# Patient Record
Sex: Female | Born: 1965
Health system: Southern US, Community
[De-identification: ages and names within clinical notes are randomized; demographics above are authoritative.]

## PROBLEM LIST (undated history)

## (undated) DIAGNOSIS — G2581 Restless legs syndrome: Secondary | ICD-10-CM

## (undated) DIAGNOSIS — T7840XA Allergy, unspecified, initial encounter: Secondary | ICD-10-CM

## (undated) DIAGNOSIS — M4800 Spinal stenosis, site unspecified: Secondary | ICD-10-CM

## (undated) DIAGNOSIS — K760 Fatty (change of) liver, not elsewhere classified: Secondary | ICD-10-CM

## (undated) DIAGNOSIS — R69 Illness, unspecified: Secondary | ICD-10-CM

## (undated) DIAGNOSIS — M26629 Arthralgia of temporomandibular joint, unspecified side: Secondary | ICD-10-CM

## (undated) DIAGNOSIS — F329 Major depressive disorder, single episode, unspecified: Secondary | ICD-10-CM

## (undated) DIAGNOSIS — G4700 Insomnia, unspecified: Secondary | ICD-10-CM

## (undated) DIAGNOSIS — I10 Essential (primary) hypertension: Secondary | ICD-10-CM

## (undated) DIAGNOSIS — M810 Age-related osteoporosis without current pathological fracture: Secondary | ICD-10-CM

## (undated) DIAGNOSIS — E079 Disorder of thyroid, unspecified: Secondary | ICD-10-CM

## (undated) DIAGNOSIS — Z87898 Personal history of other specified conditions: Secondary | ICD-10-CM

## (undated) DIAGNOSIS — M797 Fibromyalgia: Secondary | ICD-10-CM

## (undated) DIAGNOSIS — S73199A Other sprain of unspecified hip, initial encounter: Secondary | ICD-10-CM

## (undated) DIAGNOSIS — E559 Vitamin D deficiency, unspecified: Secondary | ICD-10-CM

## (undated) DIAGNOSIS — F419 Anxiety disorder, unspecified: Secondary | ICD-10-CM

## (undated) DIAGNOSIS — Z8781 Personal history of (healed) traumatic fracture: Secondary | ICD-10-CM

## (undated) DIAGNOSIS — F32A Depression, unspecified: Secondary | ICD-10-CM

## (undated) DIAGNOSIS — D649 Anemia, unspecified: Secondary | ICD-10-CM

## (undated) DIAGNOSIS — Z862 Personal history of diseases of the blood and blood-forming organs and certain disorders involving the immune mechanism: Secondary | ICD-10-CM

## (undated) DIAGNOSIS — Z8619 Personal history of other infectious and parasitic diseases: Secondary | ICD-10-CM

## (undated) DIAGNOSIS — G4730 Sleep apnea, unspecified: Secondary | ICD-10-CM

## (undated) DIAGNOSIS — M1990 Unspecified osteoarthritis, unspecified site: Secondary | ICD-10-CM

## (undated) DIAGNOSIS — K219 Gastro-esophageal reflux disease without esophagitis: Secondary | ICD-10-CM

## (undated) DIAGNOSIS — G43909 Migraine, unspecified, not intractable, without status migrainosus: Secondary | ICD-10-CM

## (undated) DIAGNOSIS — E039 Hypothyroidism, unspecified: Secondary | ICD-10-CM

## (undated) HISTORY — PX: OOPHORECTOMY: SHX86

## (undated) HISTORY — DX: Migraine, unspecified, not intractable, without status migrainosus: G43.909

## (undated) HISTORY — DX: Anemia, unspecified: D64.9

## (undated) HISTORY — DX: Other sprain of unspecified hip, initial encounter: S73.199A

## (undated) HISTORY — DX: Spinal stenosis, site unspecified: M48.00

## (undated) HISTORY — DX: Allergy, unspecified, initial encounter: T78.40XA

## (undated) HISTORY — DX: Essential (primary) hypertension: I10

## (undated) HISTORY — DX: Restless legs syndrome: G25.81

## (undated) HISTORY — PX: MYOMECTOMY: SHX85

## (undated) HISTORY — DX: Personal history of other infectious and parasitic diseases: Z86.19

## (undated) HISTORY — PX: ENDOMETRIAL ABLATION: SHX621

## (undated) HISTORY — DX: Personal history of (healed) traumatic fracture: Z87.81

## (undated) HISTORY — PX: BUNIONECTOMY: SHX129

## (undated) HISTORY — DX: Arthralgia of temporomandibular joint, unspecified side: M26.629

## (undated) HISTORY — DX: Personal history of other specified conditions: Z87.898

## (undated) HISTORY — DX: Unspecified osteoarthritis, unspecified site: M19.90

## (undated) HISTORY — DX: Insomnia, unspecified: G47.00

## (undated) HISTORY — DX: Fibromyalgia: M79.7

## (undated) HISTORY — DX: Vitamin D deficiency, unspecified: E55.9

## (undated) HISTORY — PX: OTHER SURGICAL HISTORY: SHX169

## (undated) HISTORY — DX: Gastro-esophageal reflux disease without esophagitis: K21.9

## (undated) HISTORY — PX: ABDOMINAL HYSTERECTOMY: SHX81

## (undated) HISTORY — PX: TONSILLECTOMY AND ADENOIDECTOMY: SUR1326

## (undated) HISTORY — DX: Age-related osteoporosis without current pathological fracture: M81.0

## (undated) HISTORY — DX: Disorder of thyroid, unspecified: E07.9

## (undated) HISTORY — DX: Anxiety disorder, unspecified: F41.9

## (undated) HISTORY — DX: Personal history of diseases of the blood and blood-forming organs and certain disorders involving the immune mechanism: Z86.2

---

## 2000-05-29 ENCOUNTER — Other Ambulatory Visit: Admission: RE | Admit: 2000-05-29 | Discharge: 2000-05-29 | Payer: Self-pay | Admitting: Internal Medicine

## 2000-05-30 ENCOUNTER — Other Ambulatory Visit: Admission: RE | Admit: 2000-05-30 | Discharge: 2000-05-30 | Payer: Self-pay | Admitting: Internal Medicine

## 2000-06-25 ENCOUNTER — Other Ambulatory Visit: Admission: RE | Admit: 2000-06-25 | Discharge: 2000-06-25 | Payer: Self-pay | Admitting: *Deleted

## 2000-06-25 ENCOUNTER — Encounter (INDEPENDENT_AMBULATORY_CARE_PROVIDER_SITE_OTHER): Payer: Self-pay | Admitting: Specialist

## 2000-11-28 ENCOUNTER — Other Ambulatory Visit: Admission: RE | Admit: 2000-11-28 | Discharge: 2000-11-28 | Payer: Self-pay | Admitting: Internal Medicine

## 2001-05-26 ENCOUNTER — Other Ambulatory Visit: Admission: RE | Admit: 2001-05-26 | Discharge: 2001-05-26 | Payer: Self-pay | Admitting: Internal Medicine

## 2001-10-17 ENCOUNTER — Encounter: Admission: RE | Admit: 2001-10-17 | Discharge: 2001-10-17 | Payer: Self-pay | Admitting: Internal Medicine

## 2001-10-17 ENCOUNTER — Encounter: Payer: Self-pay | Admitting: Internal Medicine

## 2002-04-15 ENCOUNTER — Inpatient Hospital Stay (HOSPITAL_COMMUNITY): Admission: RE | Admit: 2002-04-15 | Discharge: 2002-04-17 | Payer: Self-pay | Admitting: Obstetrics & Gynecology

## 2002-04-15 ENCOUNTER — Encounter (INDEPENDENT_AMBULATORY_CARE_PROVIDER_SITE_OTHER): Payer: Self-pay

## 2002-04-20 ENCOUNTER — Inpatient Hospital Stay (HOSPITAL_COMMUNITY): Admission: AD | Admit: 2002-04-20 | Discharge: 2002-04-20 | Payer: Self-pay | Admitting: Obstetrics

## 2002-09-27 ENCOUNTER — Emergency Department (HOSPITAL_COMMUNITY): Admission: EM | Admit: 2002-09-27 | Discharge: 2002-09-27 | Payer: Self-pay | Admitting: Emergency Medicine

## 2002-10-08 ENCOUNTER — Encounter: Payer: Self-pay | Admitting: Obstetrics & Gynecology

## 2002-10-08 ENCOUNTER — Ambulatory Visit (HOSPITAL_COMMUNITY): Admission: RE | Admit: 2002-10-08 | Discharge: 2002-10-08 | Payer: Self-pay | Admitting: Obstetrics & Gynecology

## 2002-10-11 ENCOUNTER — Emergency Department (HOSPITAL_COMMUNITY): Admission: EM | Admit: 2002-10-11 | Discharge: 2002-10-11 | Payer: Self-pay | Admitting: Emergency Medicine

## 2003-03-30 ENCOUNTER — Encounter: Admission: RE | Admit: 2003-03-30 | Discharge: 2003-06-09 | Payer: Self-pay | Admitting: Obstetrics and Gynecology

## 2004-02-07 ENCOUNTER — Encounter: Admission: RE | Admit: 2004-02-07 | Discharge: 2004-02-07 | Payer: Self-pay | Admitting: Internal Medicine

## 2004-02-18 ENCOUNTER — Encounter: Admission: RE | Admit: 2004-02-18 | Discharge: 2004-02-18 | Payer: Self-pay | Admitting: Internal Medicine

## 2004-05-18 ENCOUNTER — Encounter (INDEPENDENT_AMBULATORY_CARE_PROVIDER_SITE_OTHER): Payer: Self-pay | Admitting: Specialist

## 2004-05-18 ENCOUNTER — Ambulatory Visit (HOSPITAL_COMMUNITY): Admission: RE | Admit: 2004-05-18 | Discharge: 2004-05-18 | Payer: Self-pay | Admitting: Obstetrics and Gynecology

## 2004-07-14 ENCOUNTER — Encounter: Admission: RE | Admit: 2004-07-14 | Discharge: 2004-07-14 | Payer: Self-pay | Admitting: Orthopaedic Surgery

## 2004-10-04 ENCOUNTER — Encounter: Admission: RE | Admit: 2004-10-04 | Discharge: 2004-10-04 | Payer: Self-pay | Admitting: Rheumatology

## 2005-01-05 ENCOUNTER — Ambulatory Visit (HOSPITAL_COMMUNITY): Admission: RE | Admit: 2005-01-05 | Discharge: 2005-01-05 | Payer: Self-pay | Admitting: Neurology

## 2005-01-12 ENCOUNTER — Encounter: Admission: RE | Admit: 2005-01-12 | Discharge: 2005-01-12 | Payer: Self-pay | Admitting: Rheumatology

## 2005-01-19 ENCOUNTER — Encounter: Admission: RE | Admit: 2005-01-19 | Discharge: 2005-01-19 | Payer: Self-pay | Admitting: Internal Medicine

## 2005-01-26 ENCOUNTER — Encounter: Admission: RE | Admit: 2005-01-26 | Discharge: 2005-01-26 | Payer: Self-pay | Admitting: Neurology

## 2005-02-20 ENCOUNTER — Encounter: Admission: RE | Admit: 2005-02-20 | Discharge: 2005-05-21 | Payer: Self-pay | Admitting: Specialist

## 2005-02-22 ENCOUNTER — Encounter: Admission: RE | Admit: 2005-02-22 | Discharge: 2005-02-22 | Payer: Self-pay | Admitting: Neurology

## 2005-02-22 ENCOUNTER — Ambulatory Visit (HOSPITAL_BASED_OUTPATIENT_CLINIC_OR_DEPARTMENT_OTHER): Admission: RE | Admit: 2005-02-22 | Discharge: 2005-02-22 | Payer: Self-pay | Admitting: Neurology

## 2005-02-25 ENCOUNTER — Ambulatory Visit: Payer: Self-pay | Admitting: Internal Medicine

## 2005-06-18 ENCOUNTER — Inpatient Hospital Stay (HOSPITAL_COMMUNITY): Admission: RE | Admit: 2005-06-18 | Discharge: 2005-06-22 | Payer: Self-pay | Admitting: Neurosurgery

## 2005-06-29 ENCOUNTER — Encounter: Admission: RE | Admit: 2005-06-29 | Discharge: 2005-06-29 | Payer: Self-pay | Admitting: Neurosurgery

## 2005-07-11 ENCOUNTER — Encounter: Admission: RE | Admit: 2005-07-11 | Discharge: 2005-07-11 | Payer: Self-pay | Admitting: Neurosurgery

## 2006-03-06 ENCOUNTER — Encounter (INDEPENDENT_AMBULATORY_CARE_PROVIDER_SITE_OTHER): Payer: Self-pay | Admitting: *Deleted

## 2006-03-06 ENCOUNTER — Inpatient Hospital Stay (HOSPITAL_COMMUNITY): Admission: RE | Admit: 2006-03-06 | Discharge: 2006-03-08 | Payer: Self-pay | Admitting: Obstetrics and Gynecology

## 2007-10-16 ENCOUNTER — Encounter: Admission: RE | Admit: 2007-10-16 | Discharge: 2007-10-16 | Payer: Self-pay | Admitting: Family Medicine

## 2009-01-10 ENCOUNTER — Encounter: Admission: RE | Admit: 2009-01-10 | Discharge: 2009-01-10 | Payer: Self-pay | Admitting: Otolaryngology

## 2009-01-24 ENCOUNTER — Encounter: Admission: RE | Admit: 2009-01-24 | Discharge: 2009-01-24 | Payer: Self-pay | Admitting: Obstetrics and Gynecology

## 2009-03-02 ENCOUNTER — Encounter: Admission: RE | Admit: 2009-03-02 | Discharge: 2009-03-02 | Payer: Self-pay | Admitting: Otolaryngology

## 2009-05-19 ENCOUNTER — Encounter: Admission: RE | Admit: 2009-05-19 | Discharge: 2009-05-19 | Payer: Self-pay | Admitting: Gastroenterology

## 2010-02-06 ENCOUNTER — Encounter: Admission: RE | Admit: 2010-02-06 | Discharge: 2010-02-06 | Payer: Self-pay | Admitting: Obstetrics and Gynecology

## 2010-07-28 NOTE — Op Note (Signed)
Abigail Gomez, Abigail Gomez               ACCOUNT NO.:  [id number]   MEDICAL RECORD NO.:  [id number]          PATIENT TYPE:  AMB   LOCATION:  SDC                           FACILITY:  [facility]   PHYSICIAN:  Michelle L. Grewal, M.D.DATE OF BIRTH:  1966/03/11   DATE OF PROCEDURE:  05/18/2004  DATE OF DISCHARGE:                                 OPERATIVE REPORT   PREOPERATIVE DIAGNOSIS:  Pelvic pain.   POSTOPERATIVE DIAGNOSES:  1.  Pelvic adhesions.  2.  Endometriosis.  3.  Ovarian cyst.   PROCEDURES:  1.  Diagnostic laparoscopy.  2.  Lysis of adhesions.  3.  Fulguration of endometriosis.  4.  Right salpingo-oophorectomy.   SURGEON:  Michelle L. Vincente Poli, M.D.   ANESTHESIA:  General.   SPECIMENS:  Right tube and right ovary.   ESTIMATED BLOOD LOSS:  Minimal.   COMPLICATIONS:  None.   PROCEDURE:  The patient was taken to the operating room.  She was intubated  without difficulty.  She was prepped and draped in the usual sterile  fashion.  A Foley catheter was inserted into the bladder.  A uterine  manipulator was inserted into the uterus.   Attention was turned to the abdomen.  A small infraumbilical incision was  made.  The Veress needle was inserted with one attempt.  The  pneumoperitoneum was achieved.  We then put the 11 mm trocar through the  same incision, the laparoscope was introduced into the abdominal cavity.  The patient was placed in Trendelenburg position.  The upper abdomen was  normal.  The appendix was visualized and was noted to be normal.  The lower  abdomen revealed that there were filmy adhesions of the sigmoid to the  posterior wall of the uterus, which was then clipped using scissors.  There  was also another filmy adhesion of the sigmoid to the left fallopian tube,  which was also cut.  A secondary 5 mm trocar was inserted under direct  visualization.  At this point we noticed small areas of deposits of  endometriosis on the bladder flap, more on the left side.   The uterus was  very mobile and appeared normal.  There was no obvious fibroid noted.  The  tubes were normal.  Both ovaries were enlarged, the right much greater than  the left.  The left ovary had a simple cyst in it, which was drained and was  slightly adherent to the pelvic sidewall, which was freed up easily using  some traction.  The right ovary looked like there were numerous ovarian  cysts, and this is the ovary that previously had a biopsy which revealed  endometriosis.  We decided just to perform a right salpingo-oophorectomy  using the Gyrus instrument, identifying the IP ligament burning and cutting  it and then going along the mesosalpinx and then traveling back up to the  triple pedicle.  After the right tube and ovary were freed with excellent  hemostasis, the ovary and tubes were dropped in the cul-de-sac.  We then  converted the 5 mm trocar to a 10, placed an Endobag in, easily  placed the  specimen into the Endobag, and the specimen was removed through the  suprapubic incision.  We then replaced the 10 mm trocar and I used the  Kleppingers to burn the endometriosis that was on the bladder flap on the  left side.  Irrigation of the pelvis was performed, no bleeding was noted.  Pneumoperitoneum was released.  We removed the 10 mm trocar under direct  visualization.  There was no bleeding from that site.  The other trocar was  then removed as well.  The 10 mm trocar site was closed.  The fascia was  closed using figure-of-eight using 0 Vicryl suture.  The skin was closed  with 3-0 Vicryl interrupteds and the skin at the suprapubic site was also  closed with 3-0 Vicryl interrupteds.  Local was injected at each site.  All  instruments were removed from the vagina.  All sponge, lap and instrument  counts were correct x2.  The patient was went to the recovery room  extubated, in stable condition.      MLG/MEDQ  D:  05/18/2004  T:  05/18/2004  Job:  [id number]

## 2010-07-28 NOTE — Discharge Summary (Signed)
   NAME:  Abigail Gomez, Abigail Gomez                         ACCOUNT NO.:  [id number]   MEDICAL RECORD NO.:  [id number]                   PATIENT TYPE:  INP   LOCATION:  9325                                 FACILITY:  [facility]   PHYSICIAN:  Roseanna Rainbow, M.D.         DATE OF BIRTH:  January 30, 1966   DATE OF ADMISSION:  04/15/2002  DATE OF DISCHARGE:  04/17/2002                                 DISCHARGE SUMMARY   CHIEF COMPLAINT:  The patient is a 45 year old African-American female with  chronic pelvic pain and uterine fibroids who presents for exploratory  laparotomy with abdominal myomectomy and possible resection of endometriotic  implants.  Please see the dictated History and Physical for further details.   HOSPITAL COURSE:  The patient was admitted, underwent an exploratory  laparotomy with myomectomy and removal of paratubal cyst and biopsy of the  right ovary.  Please see the dictated operative summary for further details.  Her postoperative course was uneventful and she was discharged to home on  postoperative day #2, tolerating a regular diet.   DISCHARGE DIAGNOSES:  1. Uterine fibroids.  2. Endometriosis.  3. Paratubal cyst.   PROCEDURE:  Exploratory laparotomy, abdominal myomectomy, removal of  paratubal cysts, and biopsy of right ovary.   CONDITION:  Stable.   DIET:  Regular.   ACTIVITY:  No strenuous activity or intercourse.   MEDICATIONS:  Percocet one to two tablets p.o. q.i.d. p.r.n.   DISPOSITION:  The patient was to follow up in the office on April 20, 2002  for staple removal.                                               Roseanna Rainbow, M.D.    LAJ/MEDQ  D:  05/14/2002  T:  05/14/2002  Job:  [id number]

## 2010-07-28 NOTE — H&P (Signed)
NAME:  Abigail Gomez, Abigail Gomez NO.:  [id number]   MEDICAL RECORD NO.:  [id number]                   PATIENT TYPE:  INP   LOCATION:  NA                                   FACILITY:  [facility]   PHYSICIAN:  Roseanna Rainbow, M.D.         DATE OF BIRTH:  16-Nov-1965   DATE OF ADMISSION:  DATE OF DISCHARGE:                                HISTORY & PHYSICAL   CHIEF COMPLAINT:  The patient is a 45 year old African-American female with  chronic pelvic pain and uterine fibroids who presents for exploratory  laparotomy with abdominal myomectomy and possible resection of endometriotic  implants.   HISTORY OF PRESENT ILLNESS:  The patient describes her menstrual periods as  regular with a duration of four days.  She has had moderate dysmenorrhea  which has not been helped by medication.  She has also had episodes of  spotting preceding her menses over the past year.  She has sharp pelvic pain  without menses as well.  A review of the pelvic pain questionnaire was  consistent with a premenstrual syndrome and secondary dysmenorrhea.  Work-up  to date has included a normal Pap smear from March of 2003, an ultrasound  from August of 2003 that demonstrated a retroverted uterus that was 8.6 cm  in sagittal diameter with a dominant approximately 4 cm in diameter uterine  fibroid.  Laboratory work includes a normal TSH from November of 2003 as  well as a hemoglobin of 10.4.   PAST OB/GYN HISTORY:  As above.  Contraceptive type is abstinence.  She has  a history of human papilloma virus, Chlamydia, cervicitis.  She has been  pregnant two times.  She has no living children and there is a history of  two miscarriages or abortions.   PAST MEDICAL HISTORY:  1. Migraine headaches.  2. Psoriasis.  3. Eczema.  4. Bell's palsy.   PAST SURGICAL HISTORY:  Bunionectomy.   SOCIAL HISTORY:  She is single.  She is a Engineer, mining.  She has no significant smoking  history.  She does not give  any significant history of alcohol usage.  She reports a minimal amount of  caffeinated beverages daily and she denies illicit drug use.   FAMILY HISTORY:  Uterine cancer, hypertension, cerebrovascular accident,  adult-onset diabetes, osteoporosis, and Alzheimer's.   REVIEW OF SYSTEMS:  GASTROINTESTINAL:  No food intolerance, abdominal pain,  nausea, vomiting, bloating, water brash, diarrhea, constipation, melena, or  hematochezia.  No change in the caliber of her stools.  GENITOURINARY:  See  history of present illness.  No urinary problems were noted.  MUSCULOSKELETAL:  No muscle or joint pain, weakness, swelling, inflammation.  No restriction of motion.  No atrophy or back ache.  PSYCHIATRIC:  She notes  increased stress and has insomnia.   MEDICATIONS:  1. Zyrtec.  2. Claritin.  3. Allegra.  4. Hydroxyzine.  5. Midrin.  6. Maxalt.  7. Topamax.  8. Lodine.  9. Darvocet.  10.      Percocet.  11.      BenzaClin.   ALLERGIES:  PENICILLIN--anaphylactoid type response.   PHYSICAL EXAMINATION:  VITAL SIGNS:  Height 5 feet 8 inches, weight 165  pounds, temperature 97.4, pulse 64, blood pressure 120/80.  GENERAL:  African-American female.  Appears stated age, in no acute  distress.  LUNGS:  Clear to auscultation.  HEART:  Regular rate and rhythm.  ABDOMEN:  Soft, nontender, without masses.  Bowel sounds active.  PELVIC:  External female genitalia are normal appearing.  On speculum  examination the vagina is clean.  On bimanual examination the uterus is  nontender, mildly enlarged, and retroverted.  The adnexa no masses,  organomegaly, or local guarding.   ASSESSMENT:  1. Uterine fibroids.  2. Pelvic pain.  3. Secondary anemia.   The risks, benefits, and alternative forms of management were reviewed with  the patient.  The questions were allowed to stated satisfaction and informed  consent was obtained.   PLAN:  The planned procedure is  exploratory laparotomy with abdominal  myomectomies and possible resection of endometriotic implants.                                               Roseanna Rainbow, M.D.    Judee Clara  D:  03/18/2002  T:  03/18/2002  Job:  [id number]

## 2010-07-28 NOTE — Procedures (Signed)
NAME:  Abigail Gomez, Abigail Gomez               ACCOUNT NO.:  [id number]   MEDICAL RECORD NO.:  [id number]          PATIENT TYPE:  OUT   LOCATION:  SLEEP CENTER                 FACILITY:  [facility]   PHYSICIAN:  Clinton D. Maple Hudson, M.D. DATE OF BIRTH:  1965-08-12   DATE OF STUDY:                              NOCTURNAL POLYSOMNOGRAM   REFERRING PHYSICIAN:  Dr. Amelia Jo.   DATE OF STUDY:  February 22, 2005.   INDICATION FOR STUDY:  Insomnia with sleep apnea.   EPWORTH SLEEPINESS SCORE:  4/24.   WEIGHT:  180 Pounds.   SLEEP ARCHITECTURE:  Total sleep time 322 minutes with sleep efficiency 76%.  Stage I was 2%, stage II 88%, stages III and IV 4%, REM 6% of total sleep  time. Sleep latency 81 minutes, REM latency 322 minutes, awake after sleep  onset 19 minutes, arousal index 5.6. She took Darvocet at 9:40 p.m. She took  baclofen, Cymbalta and Seroquel at 10 p.m. and then 10 milligrams of Ambien  at 11:47 p.m. She applied a back stimulation treatment prior to 10 p.m. and  applied 2 lidocaine patches and 11 p.m. to her pelvis. She requested to read  at lights out.   RESPIRATORY DATA:  Apnea/hypopnea index (AHI, RDI) 0.6 per hour which is  negligible and within normal limits. There were 2 obstructive apneas and 1  hypopnea. She slept mainly supine. REM AHI 3.2 per hour.   OXYGEN DATA:  No snoring was noted. Oxygen desaturation was to 83%  transiently with mean oxygen saturation through the study at 98% on room  air.   CARDIAC DATA:  Normal sinus rhythm.   MOVEMENT/PARASOMNIA:  A total of 85 limb jerks were reported of which 15  were associated with arousal or awakening for periodic limb movement with  arousal index of 2.8 per hour which is of doubtful significance.   IMPRESSION/RECOMMENDATIONS:  1.  Significant amounts of sedating medication taken in the evening at study      onset.  2.  Sleep architecture significant especially for reduced sleep efficiency      and reduced REM which may  include some medication effect.  3.  No significant sleep disordered breathing or cardiac rhythm disorder.  4.  Very minimal periodic limb movement with arousal, 2.8 per hour.      Clinton D. Maple Hudson, M.D.  Diplomate, Biomedical engineer of Sleep Medicine  Electronically Signed     CDY/MEDQ  D:  02/25/2005 18:00:27  T:  02/26/2005 01:11:47  Job:  [id number]

## 2010-07-28 NOTE — H&P (Signed)
NAME:  Abigail Gomez, CHINCHILLA               ACCOUNT NO.:  [id number]   MEDICAL RECORD NO.:  [id number]          PATIENT TYPE:  AMB   LOCATION:  SDS                          FACILITY:  [facility]   PHYSICIAN:  Sanjeev K. Deveshwar, M.D.DATE OF BIRTH:  12-Apr-1965   DATE OF ADMISSION:  01/05/2005  DATE OF DISCHARGE:                                HISTORY & PHYSICAL   CHIEF COMPLAINT:  The patient presents today for cerebral angiogram.   HISTORY OF PRESENT ILLNESS:  This is a 45 year old female evaluated by Dr.  Clarisse Gouge recently for headaches.  An MRI/MRA was performed on December 28, 2004.  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  A cerebral angiogram  has been recommended.  The patient was referred to Dr. Corliss Skains.  She  presents today for that study.  She reports symptoms of ongoing headaches,  problems with her balance which she describes as cognitive dulling.   ALLERGIES:  PENICILLIN causes anaphylaxis.   CURRENT MEDICATIONS:  1.  Cymbalta 90 mg b.i.d.  2.  Ambien 10 mg at bedtime.  3.  Xanax 0.5 mg daily.  4.  Zanaflex one daily.  5.  Zyrtec 10 mg daily.  6.  Provigil 200 mg daily.  7.  Multivitamins daily.  8.  Seroquel 25 mg daily.  9.  Midrin p.r.n.  10. She also takes herbal teas, zinc, calcium with vitamin D and B vitamins      as well as Omega III.   PAST MEDICAL HISTORY:  1.  Borderline hypertension, currently being followed.  2.  History of uterine fibroids and endometriosis.  3.  History of migraine headaches.  4.  Psoriasis.  5.  Eczema.  6.  History of Bell's Palsy.   PAST SURGICAL HISTORY:  1.  The patient has had surgery for uterine fibroids as well as a right      ovary resection.  2.  History of bunion surgery.   SOCIAL HISTORY:  Patient is single.  She lives in [place], [place]  [place], alone.  She does not use alcohol or tobacco.  She works as a  Administrator, sports.   FAMILY HISTORY:  Her parents are both living.  Her mother is in  her 37s.  Her father is in his 68s.  They both have depression.   REVIEW OF SYSTEMS:  Completely negative except for the ongoing headaches.  Poor balance.  Cognitive problems.  She has occasional chest pain she feels  is related to anxiety.  She has a history of anxiety and depression.  She  has occasional gas and constipation.  She has fibromyalgia.  She reports  bruising easily.   PHYSICAL EXAMINATION:  GENERAL APPEARANCE:  A pleasant 45 year old African  American female in no acute distress.  VITAL SIGNS:  Blood pressure 118/74, pulse 72, respirations 16, temperature  97.6, oxygen saturation 98% on room air.  Her airway is rated at a 2.  Her  ASA scale is rated at a 1.  HEENT:  Unremarkable.  NECK:  No bruits, no jugular venous distension.  LUNGS:  Clear.  CARDIOVASCULAR:  Regular rate and  rhythm without murmurs.  ABDOMEN:  Soft, nontender.  EXTREMITIES:  Pulses weak but intact.  There is no significant edema.  SKIN:  Warm and dry.  NEUROLOGIC:  Mental status:  The patient is alert and oriented and follows  commands.  Cranial nerves II-XII grossly intact.  Sensation is intact to  light touch.  Motor strength is 4 to 5/5 throughout.  Cerebellar testing is  intact but performed very slowly.   IMPRESSION:  1.  History of headaches, poor balance and cognitive difficulties.  2.  Abnormal MRA performed December 28, 2004, with a question of a basilar      artery aneurysm.  3.  History of borderline hypertension, currently not treated with      medication.  4.  History of uterine fibroids and endometriosis.  5.  History of psoriasis and eczema.  6.  History of Bell's palsy.  7.  History of migraine headaches.  8.  Penicillin allergy with history of anaphylaxis.  9.  History of anxiety and depression.  10. Fibromyalgia.  11. Status post surgeries as noted above.   PLAN:  As noted, the patient will undergo cerebral angiogram today to  further evaluate for possible  aneurysms.      Delton See, P.A.    ______________________________  Grandville Silos. Corliss Skains, M.D.    DR/MEDQ  D:  01/05/2005  T:  01/05/2005  Job:  [id number]   cc:   Candyce Churn. Allyne Gee, M.D.  Fax: [phone]

## 2010-07-28 NOTE — Op Note (Signed)
NAME:  Abigail Gomez, Abigail Gomez                         ACCOUNT NO.:  [id number]   MEDICAL RECORD NO.:  [id number]                   PATIENT TYPE:  INP   LOCATION:  9322                                 FACILITY:  [facility]   PHYSICIAN:  Roseanna Rainbow, M.D.         DATE OF BIRTH:  June 02, 1965   DATE OF PROCEDURE:  04/15/2002  DATE OF DISCHARGE:                                 OPERATIVE REPORT   PREOPERATIVE DIAGNOSES:  1. Uterine fibroids with secondary anemia.  2. Pelvic pain.   POSTOPERATIVE DIAGNOSES:  1. Uterine fibroids with secondary anemia.  2. Pelvic pain.  3. Bilateral peritubal cysts.  4. Rule out right ovarian endometriotic implant.   PROCEDURE:  1. Exploratory laparotomy.  2. Abdominal myomectomy.  3. Excision of peritubal cysts.  4. Biopsy of the right ovarian cortex.   SURGEON:  Roseanna Rainbow, M.D., Bing Neighbors. Clearance Coots, M.D.   ANESTHESIA:  General endotracheal.   COMPLICATIONS:  None.   ESTIMATED BLOOD LOSS:  Less than 50 mL.   FLUIDS:  As per anesthesiology.   URINE OUTPUT:  As per anesthesiology.   FINDINGS:  There was a fundal myoma coming off of the posterior aspect of  the uterus that was approximately 4-5 cm in diameter.  Involving the  fimbriated portions of the tubes bilaterally were simple appearing cysts  approximately 1 cm in diameter.  On the ovarian cortex on the right the  antimesenteric portion of the ovary there was a small approximately 5 mm  diameter questionable powder burn lesion.  The remainder of the peritoneal  surfaces involving the anterior/posterior cul-de-sacs, ovarian fossa were  normal.  The appendix appeared normal as well.   PROCEDURE:  The risks, benefits, indications, and alternatives of the  procedure were reviewed with the patient and informed consent was obtained.  She was taken to the operating room with an IV running.  The patient was  placed in the dorsal lithotomy position, given general anesthesia, and  prepped  and draped in the usual sterile fashion.  A Pfannenstiel skin  incision was then made approximately 2 cm above the symphysis pubis and  extended to the rectus fascia.  The fascia was then incised bilaterally with  curved Mayo scissors and the muscles of the anterior abdominal wall were  separated in the midline.  The parietoperitoneum was then grasped between  two pickups, elevated, and entered sharply with Metzenbaum scissors.  The  pelvis was examined with the findings noted above.  An O'Connor-O-Sullivan  retractor was then placed into the incision and the bowel packed away with  moistened laparotomy sponges.  At this point the fibroid was grasped with  towel clamps.  The myometrium overlying the fibroid was then incised with  cautery.  This was after the serosa was infiltrated with a dilute Pitressin  solution.  The fibroid was then grasped and enucleated with sharp  dissection.  The base was clamped and suture ligated  with 2-0 Monocryl.  The  defect of the myometrium was then closed in layers using running suture of 2-  0 Monocryl.  Excellent hemostasis was noted.  The right ovarian biopsy was  then obtained with cautery.  Excellent hemostasis was noted.  The peritubal  cysts were excised utilizing cautery as well.  Again, excellent hemostasis  was noted.  The pelvis was copiously irrigated with warm normal saline.  All  laparotomy sponges and instruments were removed from the abdomen.  The  fascia was closed with 0 Vicryl.  The subcutaneous layer was then  infiltrated with Marcaine with a dilute epinephrine solution.  The skin was  closed with staples.  Sponge, lap, needle, and instrument counts were  correct x2.  The patient was taken to the PACU awake and in stable  condition.                                               Roseanna Rainbow, M.D.    Judee Clara  D:  04/15/2002  T:  04/15/2002  Job:  [id number]

## 2010-07-28 NOTE — H&P (Signed)
[name], Abigail Gomez NO.:  [id number]   MEDICAL RECORD NO.:  [id number]          PATIENT TYPE:  INP   LOCATION:  3172                         FACILITY:  [facility]   PHYSICIAN:  Hewitt Shorts, M.D.DATE OF BIRTH:  [birth year]   DATE OF ADMISSION:  06/18/2005  DATE OF DISCHARGE:                                HISTORY & PHYSICAL   HISTORY OF PRESENT ILLNESS:  The patient is a 45 year old, right-handed,  black female who has a longstanding of migraines dating back to 2002.  They  had been somewhat cyclical in nature but had been worse for the past year or  two.  She had been followed by a couple of different physicians for  management and evaluation of her headaches.  She describes a burning pain in  the posterior aspect of her head and neck.  The headaches themselves could  be frontal, temporal or vertex to one side or the other.  She has had  tinnitus for a number of years but denied any nausea or vomiting or  dysphagia.   The patient underwent work-up with MRI and MRA of the brain.  MRI was  notable for significant Chiari I malformation.  MRA showed a slightly  bulbous appearance with termination of the basilar artery.  However, there  was no evidence of cerebral aneurysm or arteriovenous malformation.   The patient was evaluated and consideration was made of decompression of the  Chiari malformation.  The patient was seen in second opinion consultation by  Dr. Donalee Citrin who concurred with the diagnosis of Chiari I malformation and  concurred with the recommendation for decompression.  The patient is now  admitted for decompression of her Chiari malformation.   PAST MEDICAL HISTORY:  1.  She has been treated for hypertension for the past five months.  2.  History of fibromyalgia since last year.  3.  History of insomnia.  4.  History of uterine fibroids.   Does not describe any history of myocardial infarction, cancer, stroke,  diabetes, peptic ulcer  disease, or lung disease.   PAST SURGICAL HISTORY:  1.  Right oophorectomy in March 2006.  2.  Myomectomy in February 2004.  3.  Bunionectomy in June 1998.   ALLERGIES:  She reports allergies of penicillin.   CURRENT MEDICATIONS:  1.  Lisinopril 10 mg daily.  2.  Cymbalta 90 mg daily.  3.  Zyrtec 10 mg daily.  4.  Ambien or Ambien CR at night.  5.  Flexeril 10 mg daily p.r.n.  6.  Klonopin or Xanax 1 mg at night.  7.  Ibuprofen 800 mg p.r.n.  8.  Darvocet p.r.n.  9.  Provigil 2 mg p.r.n.  10. Midrin one or two p.r.n.  11. Neurontin 300 mg t.i.d.  12. Vicodin p.r.n.  13. Demerol p.r.n.  14. Seroquel 150 mg at night.   FAMILY HISTORY:  Her mother is in good health at age 30.  Father has a  number of medical problems including alcoholism, hypertension, diabetes.  He  is age 47.  Family history of diabetes, hypertension, depression, cerebral  aneurysm,  and heart disease.  Closest relative with a cerebral aneurysm was  a maternal uncle.   SOCIAL HISTORY:  The patient is unmarried.  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  She does not smoke.  She drinks alcoholic  beverages occasionally.  She denies history of substance abuse.   REVIEW OF SYSTEMS:  Notable forwhat is described in the history of present  illness and past medical history.  She does have history of anemia but her  14-point system review is otherwise unremarkable.   PHYSICAL EXAMINATION:  GENERAL:  Well-developed, well-nourished black female  in no acute distress.  VITAL SIGNS:  Temperature 98.6, pulse 69, blood pressure 118/77, respiratory  rate 16.  Height 5 feet 8 inches, weight 185 pounds.  LUNGS:  Clear to auscultation.  She has symmetrical respiratory excursion.  HEART:  Regular rate and rhythm.  S1 and S2.  No murmur.  ABDOMEN:  Soft, nontender.  Bowel sounds are present.  EXTREMITIES:  Clubbing, cyanosis or edema.  MUSCULOSKELETAL:  No tenderness to palpation over thecervical or spinal   process .  She has a good range of motion in the neck without discomfortwith  range of motion of the neck.  NEUROLOGIC:  Mental status:  The patient is awake, alert, fully oriented.  Her speech is fluent.  She has good comprehension.  Cranial nerves show  pupils are equal, round and reactive to light.  Funduscopic examination  shows no evidence of papilledema, hemorrhages or exudates.  Extraocular  movements were intact.  Facial movement is symmetrical.  Hearing is present  bilaterally.  Palate movement is symmetrical.  Shoulder shrug is  symmetrical.  Tongue is midline.  Motor examination shows 5/5 strength in  the upper and lower extremities.  She has a normal gait and normal tandem  gait.  Sensation is intact to pinprick in the distal upper and lower  extremities.  Reflexes are 1-2 in thebiceps, brachialis, triceps, quadriceps  and gastrocnemius symmetrical.  Bilateral toes are downgoing.   IMPRESSION:  Chiari I malformation.  Longstanding history of headache,  burning sensation, tinnitus.  The patient is admitted for decompression of  the Chiari I malformation via suboccipital craniectomy, upper cervical  laminectomy and duraplasty.  She understands that this may well improve her  symptomatology although she may still have an underlying migraine condition  and may still have some headaches afterwards even after successful  decompression.  We discussed the nature of the condition, alternative to surgery, the nature  of the surgical procedure itself, estimated length of hospital stay, overall  recuperation, limitations postoperatively, and infection, bleeding, possible  need for  transfusion, risk of neurologic dysfunction, bleeding, paralysis, coma and  death and anesthetic, risk of myocardial infarction, stroke, pneumonia, and  death.  Understanding all this, the patient wishes to proceed with surgery  and is admitted for such.     Hewitt Shorts, M.D.  Electronically  Signed     RWN/MEDQ  D:  06/18/2005  T:  06/18/2005  Job:  [id number]

## 2010-07-28 NOTE — Op Note (Signed)
Abigail Gomez, Abigail Gomez               ACCOUNT NO.:  [id number]   MEDICAL RECORD NO.:  [id number]          PATIENT TYPE:  INP   LOCATION:  9305                          FACILITY:  [facility]   PHYSICIAN:  Michelle L. Grewal, M.D.DATE OF BIRTH:  Jul 31, 1965   DATE OF PROCEDURE:  03/06/2006  DATE OF DISCHARGE:                               OPERATIVE REPORT   PREOPERATIVE DIAGNOSIS:  1. Pelvic pain.  2. Dysfunctional uterine bleeding.   POSTOPERATIVE DIAGNOSES:  1. Pelvic pain.  2. Dysfunctional uterine bleeding.   PROCEDURE:  Total abdominal hysterectomy and left salpingo-oophorectomy.   SURGEON:  Michelle L. Vincente Poli, M.D.   ASSISTANT:  Zelphia Cairo, MD   ANESTHESIA:  General.   SPECIMENS:  Uterus, cervix, left tube and ovary.   ESTIMATED BLOOD LOSS:  100 mL.   COMPLICATIONS:  None.   PROCEDURE:  Patient taken to the operating room.  She is intubated  without difficulty.  She was then prepped and draped in the usual  sterile fashion.  A Foley catheter had been inserted and was draining  clear urine.  A low transverse incision was made and carried down to the  fascia, fascia scored in the midline and extended laterally.  The rectus  muscles were separated in the midline and the peritoneum was entered  bluntly.  The peritoneal incision was then stretched, the bladder blade  was inserted, and a self-retaining retractor was inserted.  The large  and small bowel were placed in the upper abdomen.  No adhesions or  endometriosis were noted.  She was an absent right tube and ovary.  The  uterus itself was small and appeared very normal.  The uterus was  grasped on either side with a Kelly clamp.  We then transected the round  ligament on the left, divided the broad ligament, identified and  infundibulopelvic ligament and the ureter, which was well below our  clamps, placed a curved Heaney clamp across the IP ligament on the left  and then suture ligated and a free tie of 0 Vicryl suture  was used to  secure the pedicle.  The bladder flap was developed on both sides and  then the uterine arteries were clamped on either side at the level of  the internal os using curved Heaney clamp.  The pedicle was secured  using a suture ligature of 0 Vicryl suture.  We then developed the  bladder flap.  The straight Heaney clamps were then placed on either  side.  Using straight Heaney clamps, each pedicle was secured using a  suture ligature of 0 Vicryl suture.  We walked our way down the cervix  and once we reached the level of the external os, curved Heaney clamps  were placed just beneath this and the specimen was removed.  The angle  stitches were placed using 0 Vicryl suture.  The remainder of the cuff  was closed using figure-of-eights using 0 Vicryl suture.  Hemostasis was  excellent.  Irrigation was performed.  Hemostasis was again excellent.  All instruments and laparotomy pads were removed from the abdominal  cavity.  The peritoneum  was closed using 0 Vicryl in a running stitch.  The rectus muscles were reapproximated using the same 0 Vicryl.  The  fascia was closed using 0 Vicryl in a running stitch starting at each  corner and meeting in the midline.  After irrigation of the subcutaneous  layer, the skin was closed with staples.  All sponge, lap and instrument  counts were correct x2.  The patient went to recovery room in stable  condition      Michelle L. Vincente Poli, M.D.  Electronically Signed     MLG/MEDQ  D:  03/06/2006  T:  03/06/2006  Job:  [id number]

## 2010-07-28 NOTE — Discharge Summary (Signed)
NAMEJERELINE, TICER NO.:  [id number]   MEDICAL RECORD NO.:  [id number]          PATIENT TYPE:  INP   LOCATION:  3041                         FACILITY:  [facility]   PHYSICIAN:  Hewitt Shorts, M.D.DATE OF BIRTH:  Dec 05, 1965   DATE OF ADMISSION:  06/18/2005  DATE OF DISCHARGE:  06/22/2005                                 DISCHARGE SUMMARY   ADMISSION HISTORY AND PHYSICAL EXAMINATION:  Patient is a 45 year old woman  with a long-standing history of headaches who has been found to have a  significant Chiari I malformation without syringomyelia or syringobulbia.  Decision made to proceed with decompression.   PAST MEDICAL HISTORY:  Fibromyalgia.  Patient is on a number of medications  all of which are detailed in her admission history and physical examination.   PHYSICAL EXAMINATION:  GENERAL:  Unremarkable.  NEUROLOGIC:  Intact.   HOSPITAL COURSE:  Patient was admitted.  Underwent a suboccipital  craniectomy C1 and superior C2 cervical laminectomy and duraplasty with  Duraguard.  She did well.  Initially monitored in the intensive care unit.  She had moderate incisional pain.  That has steadily eased.  Her wound is  healing nicely.  She is afebrile.  She is up and ambulating actively in the  halls.  She is eating well and at this point we are planning on discharging  to home.  We are going to discontinue her staples and Steri-Strip her wound.  She has been given instructions regarding activities and wound care  following discharge.  She is to return to my office in three weeks for  follow-up.  She is also to follow up with her headache specialist, Dr.  Santiago Glad in the next two to four weeks for adjustments to her  medication regimen.  She was given a prescription for Vicodin one to two  tablets q.4-6h. p.r.n. pain, 40 tablets, no refills.   DISCHARGE DIAGNOSES:  1.  Chiari I malformation.  2.  Headaches.      Hewitt Shorts, M.D.  Electronically Signed     RWN/MEDQ  D:  06/22/2005  T:  06/22/2005  Job:  [id number]

## 2010-07-28 NOTE — Discharge Summary (Signed)
NAMEJOLICIA, Abigail Gomez               ACCOUNT NO.:  [id number]   MEDICAL RECORD NO.:  [id number]          PATIENT TYPE:  INP   LOCATION:  9305                          FACILITY:  [facility]   PHYSICIAN:  Michelle L. Grewal, M.D.DATE OF BIRTH:  1965-08-27   DATE OF ADMISSION:  03/06/2006  DATE OF DISCHARGE:  03/08/2006                               DISCHARGE SUMMARY   ADMISSION DIAGNOSIS:  Fibroid, pelvic pain and history of endometriosis  and dysfunctional uterine bleeding.   DISCHARGE DIAGNOSIS:  Fibroid, pelvic pain and history of endometriosis  and dysfunctional uterine bleeding.   HOSPITAL COURSE:  The patient is a 45 year old G-0 who has a long  history of pelvic pain, DUB and a history of endometriosis. She had a  history of a right salpingo-oophorectomy performed for endometriosis.  She has also had a history of abdominal myomectomy.  On the day of  surgery, she undergoes a TAH and LSO.  EBL was about approximately 100  mL.  She did very well postop and her postop hemoglobin on postop day #1  was 10.5 and white blood cell count was 5.9.  By postop day #2, she was  urinating well.  She was tolerating a regular diet.  She had stable  vital signs and remained afebrile.  She was discharged home in good  condition on  postop day #2.  Her prescriptions were Vivelle-Dot 0.1 mg,  ibuprofen 600 mg every six and Xanax.  She will follow up in one week  for staples.  She was advised to call if she has any temperature greater  than 100.5, nausea or vomiting, severe abdominal pain, redness, or  drainage from the incision site.  She was advised no intercourse for 6  weeks and no driving for 2 weeks.      Michelle L. Vincente Poli, M.D.  Electronically Signed     MLG/MEDQ  D:  04/02/2006  T:  04/02/2006  Job:  [id number]

## 2010-07-28 NOTE — Op Note (Signed)
Abigail Gomez, MISHKIN NO.:  [id number]   MEDICAL RECORD NO.:  [id number]          PATIENT TYPE:  INP   LOCATION:  3112                         FACILITY:  [facility]   PHYSICIAN:  Hewitt Shorts, M.D.DATE OF BIRTH:  Feb 28, 1966   DATE OF PROCEDURE:  06/18/2005  DATE OF DISCHARGE:                                 OPERATIVE REPORT   PREOPERATIVE DIAGNOSIS:  Chiari I malformation.   POSTOPERATIVE DIAGNOSIS:  Chiari I malformation.   PROCEDURE:  Suboccipital craniectomy, C1 and superior C2 laminectomy, and  duraplasty with Dura-Guard with microdissection.   SURGEON:  Hewitt Shorts, M.D.   ASSISTANT:  Lovell Sheehan.   ANESTHESIA:  General endotracheal.   INDICATION FOR PROCEDURE:  The patient is a 45 year old woman.  She  presented with longstanding history of headaches, had undergone an extensive  workup with a number of physicians.  MRI and MRA were obtained that showed a  significant Chiari malformation, and decision was made to proceed with  decompression of the Chiari malformation.   PROCEDURE:  The patient was brought to the operating room and placed under  general endotracheal anesthesia.  The three-pin Mayfield head holder was  applied and the patient was turned to a prone position.  The occipital scalp  was shaved and the occipital scalp, neck and upper back were prepped with  betadine soap and solution and draped in sterile fashion.  The midline was  infiltrated with local anesthetic with epinephrine.  The midline incision  was made and carried down through the subcutaneous tissue with bipolar  cautery and electrocautery used to maintain hemostasis.  Dissection was  carried down to the posterior cervical fascia, which was incised in the  midline.  Bipolar cautery and electrocautery were used to maintain  hemostasis.  The incision was carried down along the occipital bone to the  foramen magnum.  The ring of C1 and the spinous process and lamina of C2  were exposed, and dissection was carefully carried out laterally to either  side.  We then proceeded with suboccipital craniectomy using the XMax drill  and Kerrison punches.  A C1 laminectomy and a superior C2 laminectomy were  performed, using the XMax drill and 2-mm Kerrison punches with footplate.  The occipital mantle membrane was thickened and it was carefully divided,  exposing the dura underlying it.  Then the microscope was draped and brought  into the field to provide additional magnification, illumination and  visualization, and the remainder of the decompression was performed using  microdissection and microsurgical technique.  The dura was opened at the C1-  2 level in the midline, and the dural opening was extended rostrally.  At  the level of the superior aspect of the foramen magnum the incision was  extended superolaterally to either side, and good relaxation of the dura was  created.  Edges of the dura were coagulated as needed to maintain  hemostasis, and then a 4 cm by 4 cm piece of Dura-Guard was washed in saline  solution, and then was cut to fit the dural defect and sutured to the edge  of  the dura with interrupted and running 6-0 Prolene suture.  A good  watertight closure was achieved, which was tested against a Valsalva.  We  then proceeded with closure.  The paracervical musculature was approximated  with interrupted undyed 0 Vicryl sutures, the cervical fascia was closed  with interrupted undyed 0 and 2-0 Vicryl suture, and the subcutaneous  subcuticular area closed with inverted 2-0 and 3-0 undyed Vicryl sutures.  The skin edges were approximated with surgical staples.  The wound was  dressed with Adaptic and sterile gauze, and Hypafix.  The procedure was  tolerated well.  The estimated blood loss was 150 cc.  Sponge counts were  correct.  Following surgery the patient was turned back to the supine  position.  The three-pin Mayfield head holder was removed.  The  patient was  reversed from the anesthetic, extubated and transferred to the recovery room  for further care.      Hewitt Shorts, M.D.  Electronically Signed     RWN/MEDQ  D:  06/18/2005  T:  06/18/2005  Job:  [id number]   cc:   Hewitt Shorts, M.D.  Fax: [phone]

## 2011-01-19 ENCOUNTER — Other Ambulatory Visit: Payer: Self-pay | Admitting: Obstetrics and Gynecology

## 2011-01-19 DIAGNOSIS — Z1231 Encounter for screening mammogram for malignant neoplasm of breast: Secondary | ICD-10-CM

## 2011-02-12 ENCOUNTER — Ambulatory Visit
Admission: RE | Admit: 2011-02-12 | Discharge: 2011-02-12 | Disposition: A | Discharge status: Home / Self Care | Payer: BC Managed Care – PPO | Source: Ambulatory Visit | Attending: Obstetrics and Gynecology | Admitting: Obstetrics and Gynecology

## 2011-02-12 DIAGNOSIS — Z1231 Encounter for screening mammogram for malignant neoplasm of breast: Secondary | ICD-10-CM

## 2012-01-18 ENCOUNTER — Other Ambulatory Visit: Payer: Self-pay | Admitting: Obstetrics and Gynecology

## 2012-01-18 DIAGNOSIS — Z1231 Encounter for screening mammogram for malignant neoplasm of breast: Secondary | ICD-10-CM

## 2012-02-18 ENCOUNTER — Ambulatory Visit
Admission: RE | Admit: 2012-02-18 | Discharge: 2012-02-18 | Disposition: A | Discharge status: Home / Self Care | Payer: BC Managed Care – PPO | Source: Ambulatory Visit | Attending: Obstetrics and Gynecology | Admitting: Obstetrics and Gynecology

## 2012-02-18 DIAGNOSIS — Z1231 Encounter for screening mammogram for malignant neoplasm of breast: Secondary | ICD-10-CM

## 2012-02-29 ENCOUNTER — Other Ambulatory Visit: Payer: Self-pay | Admitting: Obstetrics and Gynecology

## 2013-03-02 ENCOUNTER — Other Ambulatory Visit: Payer: Self-pay | Admitting: Obstetrics and Gynecology

## 2013-06-11 ENCOUNTER — Telehealth: Payer: Self-pay | Admitting: Internal Medicine

## 2013-06-11 NOTE — Telephone Encounter (Signed)
Per Dr. Artist PaisYoo, he will accept as a new patient.

## 2013-09-03 LAB — CBC AND DIFFERENTIAL
HEMATOCRIT: 39 % (ref 36–46)
Hemoglobin: 12.7 g/dL (ref 12.0–16.0)
Platelets: 147 10*3/uL — AB (ref 150–399)
WBC: 7.4 10^3/mL

## 2014-03-03 ENCOUNTER — Other Ambulatory Visit: Payer: Self-pay | Admitting: Obstetrics and Gynecology

## 2014-03-09 LAB — CYTOLOGY - PAP

## 2014-08-23 LAB — HEPATIC FUNCTION PANEL
ALK PHOS: 62 U/L (ref 25–125)
ALT: 17 U/L (ref 7–35)
AST: 25 U/L (ref 13–35)
Bilirubin, Total: 0.4 mg/dL

## 2014-08-23 LAB — BASIC METABOLIC PANEL
BUN: 12 mg/dL (ref 4–21)
Creatinine: 0.9 mg/dL (ref <=1.1)
GLUCOSE: 90 mg/dL
Potassium: 4.6 mmol/L (ref 3.4–5.3)
SODIUM: 138 mmol/L (ref 137–147)

## 2014-08-23 LAB — LIPID PANEL
Cholesterol: 186 mg/dL (ref 0–200)
HDL: 67 mg/dL (ref 35–70)
LDL CALC: 100 mg/dL
Triglycerides: 101 mg/dL (ref 40–160)

## 2014-08-23 LAB — HEMOGLOBIN A1C: Hemoglobin A1C: 5.1

## 2014-12-08 ENCOUNTER — Other Ambulatory Visit: Payer: Self-pay | Admitting: Specialist

## 2014-12-08 DIAGNOSIS — I729 Aneurysm of unspecified site: Secondary | ICD-10-CM

## 2014-12-27 ENCOUNTER — Other Ambulatory Visit: Payer: Self-pay

## 2014-12-27 ENCOUNTER — Inpatient Hospital Stay: Admission: RE | Admit: 2014-12-27 | Payer: Self-pay | Source: Ambulatory Visit

## 2015-02-09 ENCOUNTER — Other Ambulatory Visit: Payer: Self-pay

## 2015-06-20 ENCOUNTER — Institutional Professional Consult (permissible substitution): Payer: Self-pay | Admitting: Internal Medicine

## 2015-06-23 DIAGNOSIS — I10 Essential (primary) hypertension: Secondary | ICD-10-CM | POA: Diagnosis not present

## 2015-06-23 DIAGNOSIS — G43009 Migraine without aura, not intractable, without status migrainosus: Secondary | ICD-10-CM | POA: Diagnosis not present

## 2015-07-07 DIAGNOSIS — M25572 Pain in left ankle and joints of left foot: Secondary | ICD-10-CM | POA: Diagnosis not present

## 2015-07-07 DIAGNOSIS — M2550 Pain in unspecified joint: Secondary | ICD-10-CM | POA: Diagnosis not present

## 2015-07-07 DIAGNOSIS — Z79899 Other long term (current) drug therapy: Secondary | ICD-10-CM | POA: Diagnosis not present

## 2015-07-07 DIAGNOSIS — R5381 Other malaise: Secondary | ICD-10-CM | POA: Diagnosis not present

## 2015-07-07 DIAGNOSIS — G4709 Other insomnia: Secondary | ICD-10-CM | POA: Diagnosis not present

## 2015-07-07 DIAGNOSIS — R531 Weakness: Secondary | ICD-10-CM | POA: Diagnosis not present

## 2015-07-07 DIAGNOSIS — M797 Fibromyalgia: Secondary | ICD-10-CM | POA: Diagnosis not present

## 2015-07-08 DIAGNOSIS — R5383 Other fatigue: Secondary | ICD-10-CM | POA: Diagnosis not present

## 2015-07-08 DIAGNOSIS — N959 Unspecified menopausal and perimenopausal disorder: Secondary | ICD-10-CM | POA: Diagnosis not present

## 2015-07-08 DIAGNOSIS — G4700 Insomnia, unspecified: Secondary | ICD-10-CM | POA: Diagnosis not present

## 2015-07-08 DIAGNOSIS — F329 Major depressive disorder, single episode, unspecified: Secondary | ICD-10-CM | POA: Diagnosis not present

## 2015-07-11 DIAGNOSIS — M797 Fibromyalgia: Secondary | ICD-10-CM | POA: Diagnosis not present

## 2015-07-11 DIAGNOSIS — M545 Low back pain: Secondary | ICD-10-CM | POA: Diagnosis not present

## 2015-07-11 DIAGNOSIS — G4709 Other insomnia: Secondary | ICD-10-CM | POA: Diagnosis not present

## 2015-07-11 DIAGNOSIS — G5600 Carpal tunnel syndrome, unspecified upper limb: Secondary | ICD-10-CM | POA: Diagnosis not present

## 2015-07-18 DIAGNOSIS — M545 Low back pain: Secondary | ICD-10-CM | POA: Diagnosis not present

## 2015-07-18 DIAGNOSIS — G5600 Carpal tunnel syndrome, unspecified upper limb: Secondary | ICD-10-CM | POA: Diagnosis not present

## 2015-07-18 DIAGNOSIS — R5383 Other fatigue: Secondary | ICD-10-CM | POA: Diagnosis not present

## 2015-07-18 DIAGNOSIS — M797 Fibromyalgia: Secondary | ICD-10-CM | POA: Diagnosis not present

## 2015-07-20 DIAGNOSIS — Z79899 Other long term (current) drug therapy: Secondary | ICD-10-CM | POA: Diagnosis not present

## 2015-07-20 LAB — BASIC METABOLIC PANEL
BUN: 15 mg/dL (ref 4–21)
CREATININE: 1 mg/dL (ref 0.5–1.1)
GLUCOSE: 89 mg/dL
Potassium: 5 mmol/L (ref 3.4–5.3)
Sodium: 139 mmol/L (ref 137–147)

## 2015-07-21 DIAGNOSIS — M545 Low back pain: Secondary | ICD-10-CM | POA: Diagnosis not present

## 2015-07-21 DIAGNOSIS — M5441 Lumbago with sciatica, right side: Secondary | ICD-10-CM | POA: Diagnosis not present

## 2015-07-21 DIAGNOSIS — G5600 Carpal tunnel syndrome, unspecified upper limb: Secondary | ICD-10-CM | POA: Diagnosis not present

## 2015-07-21 DIAGNOSIS — M62830 Muscle spasm of back: Secondary | ICD-10-CM | POA: Diagnosis not present

## 2015-07-21 DIAGNOSIS — M5431 Sciatica, right side: Secondary | ICD-10-CM | POA: Diagnosis not present

## 2015-07-21 DIAGNOSIS — M797 Fibromyalgia: Secondary | ICD-10-CM | POA: Diagnosis not present

## 2015-07-21 DIAGNOSIS — M9904 Segmental and somatic dysfunction of sacral region: Secondary | ICD-10-CM | POA: Diagnosis not present

## 2015-07-21 DIAGNOSIS — G4709 Other insomnia: Secondary | ICD-10-CM | POA: Diagnosis not present

## 2015-07-25 ENCOUNTER — Encounter: Payer: Self-pay | Admitting: General Practice

## 2015-07-28 ENCOUNTER — Encounter: Payer: Self-pay | Admitting: General Practice

## 2015-07-29 ENCOUNTER — Ambulatory Visit: Payer: BLUE CROSS/BLUE SHIELD | Admitting: Family Medicine

## 2015-08-03 DIAGNOSIS — M7071 Other bursitis of hip, right hip: Secondary | ICD-10-CM | POA: Diagnosis not present

## 2015-08-03 DIAGNOSIS — M797 Fibromyalgia: Secondary | ICD-10-CM | POA: Diagnosis not present

## 2015-08-03 DIAGNOSIS — R5381 Other malaise: Secondary | ICD-10-CM | POA: Diagnosis not present

## 2015-08-03 DIAGNOSIS — G4709 Other insomnia: Secondary | ICD-10-CM | POA: Diagnosis not present

## 2015-08-04 DIAGNOSIS — M545 Low back pain: Secondary | ICD-10-CM | POA: Diagnosis not present

## 2015-08-04 DIAGNOSIS — M797 Fibromyalgia: Secondary | ICD-10-CM | POA: Diagnosis not present

## 2015-08-04 DIAGNOSIS — G5600 Carpal tunnel syndrome, unspecified upper limb: Secondary | ICD-10-CM | POA: Diagnosis not present

## 2015-08-05 DIAGNOSIS — M797 Fibromyalgia: Secondary | ICD-10-CM | POA: Diagnosis not present

## 2015-08-05 DIAGNOSIS — M545 Low back pain: Secondary | ICD-10-CM | POA: Diagnosis not present

## 2015-08-11 DIAGNOSIS — R5383 Other fatigue: Secondary | ICD-10-CM | POA: Diagnosis not present

## 2015-08-11 DIAGNOSIS — M797 Fibromyalgia: Secondary | ICD-10-CM | POA: Diagnosis not present

## 2015-08-11 DIAGNOSIS — M545 Low back pain: Secondary | ICD-10-CM | POA: Diagnosis not present

## 2015-08-12 DIAGNOSIS — G4709 Other insomnia: Secondary | ICD-10-CM | POA: Diagnosis not present

## 2015-08-12 DIAGNOSIS — G5600 Carpal tunnel syndrome, unspecified upper limb: Secondary | ICD-10-CM | POA: Diagnosis not present

## 2015-08-12 DIAGNOSIS — M545 Low back pain: Secondary | ICD-10-CM | POA: Diagnosis not present

## 2015-08-12 DIAGNOSIS — M797 Fibromyalgia: Secondary | ICD-10-CM | POA: Diagnosis not present

## 2015-08-16 DIAGNOSIS — M62830 Muscle spasm of back: Secondary | ICD-10-CM | POA: Diagnosis not present

## 2015-08-16 DIAGNOSIS — M5441 Lumbago with sciatica, right side: Secondary | ICD-10-CM | POA: Diagnosis not present

## 2015-08-16 DIAGNOSIS — M5431 Sciatica, right side: Secondary | ICD-10-CM | POA: Diagnosis not present

## 2015-08-16 DIAGNOSIS — M9904 Segmental and somatic dysfunction of sacral region: Secondary | ICD-10-CM | POA: Diagnosis not present

## 2015-08-17 DIAGNOSIS — F411 Generalized anxiety disorder: Secondary | ICD-10-CM | POA: Diagnosis not present

## 2015-08-18 DIAGNOSIS — M25572 Pain in left ankle and joints of left foot: Secondary | ICD-10-CM | POA: Diagnosis not present

## 2015-08-18 DIAGNOSIS — G5600 Carpal tunnel syndrome, unspecified upper limb: Secondary | ICD-10-CM | POA: Diagnosis not present

## 2015-08-18 DIAGNOSIS — M545 Low back pain: Secondary | ICD-10-CM | POA: Diagnosis not present

## 2015-08-19 ENCOUNTER — Ambulatory Visit (INDEPENDENT_AMBULATORY_CARE_PROVIDER_SITE_OTHER): Payer: BLUE CROSS/BLUE SHIELD | Admitting: Pulmonary Disease

## 2015-08-19 ENCOUNTER — Encounter: Payer: Self-pay | Admitting: Pulmonary Disease

## 2015-08-19 ENCOUNTER — Other Ambulatory Visit: Payer: Self-pay | Admitting: Physician Assistant

## 2015-08-19 VITALS — BP 128/68 | HR 48 | Ht 68.0 in | Wt 175.0 lb

## 2015-08-19 DIAGNOSIS — F411 Generalized anxiety disorder: Secondary | ICD-10-CM | POA: Diagnosis not present

## 2015-08-19 DIAGNOSIS — G4733 Obstructive sleep apnea (adult) (pediatric): Secondary | ICD-10-CM

## 2015-08-19 DIAGNOSIS — M25551 Pain in right hip: Secondary | ICD-10-CM

## 2015-08-19 DIAGNOSIS — G4700 Insomnia, unspecified: Secondary | ICD-10-CM

## 2015-08-19 DIAGNOSIS — F32A Depression, unspecified: Secondary | ICD-10-CM

## 2015-08-19 DIAGNOSIS — F329 Major depressive disorder, single episode, unspecified: Secondary | ICD-10-CM

## 2015-08-19 DIAGNOSIS — G4710 Hypersomnia, unspecified: Secondary | ICD-10-CM | POA: Diagnosis not present

## 2015-08-19 DIAGNOSIS — G5600 Carpal tunnel syndrome, unspecified upper limb: Secondary | ICD-10-CM | POA: Diagnosis not present

## 2015-08-19 NOTE — Progress Notes (Signed)
Subjective:    Patient ID: Abigail Gomez, female    DOB: 27-Dec-1965, 50 y.o.   MRN: [id number]  HPI   This is the case of Abigail Gomez, 50 y.o. Female, who was referred by Philipp Ovens  in consultation regarding sleep issues and insomnia.   As you very well know, patient is a non smoker, not been dxed with asthma or copd.  Pt is here for chronic sleep issues/insomnia.   No issues falling asleep until 1987. Pt moved from [place] to [place] in 1987 for college. She denies stress or trauma at that time. She noticed that her body clock just switched and she became "active and awake" at night and "very sleepy and tired" during daytime.  She let this persist and become her norm.  She ended up taking afternoon and evening classes and would stay up until early am to do her homework and would sleep at around 3-4am and wake up at noon-ish.   After college, it was a struggle for her to be awake during the morning but she had no choice since she had to work. She ended up seeing a psychiatrist and neurologist in the years to come to help her with her insomnia/depression/anxiety.  She also did CBT and biofeedback to help her with her insomnia.  She has tried almost all medicines to help her sleep.  For the most part these medicines initially made her go to sleep but she would get reistant to these meds.   From 2006 until 2016, what worked best to let her fall asleep are ambien CR 12.5 mg for 2-3 nights alternating with temazepam 30 mg for 2-3 nights.  She would then have 2-3 nights w/o meds as a "wash out" period.  These meds worked to make her fall asleep at night but she decided to stop in 2016 since she did not want to be dependent on meds.    Since 2016, she has tried melatonin 10 mg, GAB 750, Taurine 800 mg. These are organic meds and are not as effective.   Pt has also tried acupuncture to no avail.  Pt has tried the following meds through the yrs : lunesta, sonata, seroquel, remeron,  trazodone (made her hung over), requip, mirapex, rozerem, belsomra.  These meds did NOT help her fall asleep.   She has good sleep hygiene presently.  Wakes up at 7am daily.  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 Works and is on the road until [employer].  Goes home and goes to bed at 11pm or 1130pm.  Sleeping time varies, can be anywhere from 1-2 hrs.  ALWAYS wakes up at 3am, 4am, 5 am like clockwork.  She says she has recently fallen asleep driving and she di not want to elaborate.  She says she has NOT slept in 3days.   Pt uses lavender oils and drinks camomille tea.  She keeps her room dark and conducive to sleeping.   Pt has had 3 sleep studies and they have all been (-). Last study was in early 2000.   Some snoring. Has hypersomnia. (-) gasping, choking. Has hypersomnia and it affects her fxnality. (-) witnessed apneas. ESS 10.  (-) abnormal behavior in sleep. (-) cataplexy. occasionla sleep paralysis. (-) hypnogogic or hypnopompic hallucinations.   Review of Systems  Constitutional: Negative.  Negative for fever and unexpected weight change.  HENT: Positive for congestion, postnasal drip and rhinorrhea. Negative for dental problem, ear pain, nosebleeds, sinus  pressure, sneezing, sore throat and trouble swallowing.   Eyes: Negative.  Negative for redness and itching.  Respiratory: Negative.  Negative for cough, chest tightness, shortness of breath and wheezing.   Cardiovascular: Positive for palpitations. Negative for leg swelling.  Gastrointestinal: Negative.  Negative for nausea and vomiting.  Endocrine: Negative.   Genitourinary: Negative.  Negative for dysuria.  Musculoskeletal: Positive for myalgias, joint swelling and arthralgias.  Skin: Negative.  Negative for rash.  Allergic/Immunologic: Negative.   Neurological: Positive for dizziness, light-headedness and headaches.  Hematological: Bruises/bleeds easily.  Psychiatric/Behavioral: Negative.  Negative for  dysphoric mood. The patient is not nervous/anxious.    Past Medical History  Diagnosis Date  . Anxiety   . Fibromyalgia   . RLS (restless legs syndrome)   Hypothyroid. Has Sjogrens syndrome. Was dxed with Lyme dse.  With Arnold Chiari malformation. Chronic Headaches/Migranie.  (-) CA, DVT.  Bells Palsy in 1996.   No family history on file.  Mother has cholesterol issues. Father has DM, HTN.   No past surgical history on file.   1998 - S/P Bunion removal 2004 myomectomy (partial) 2006 hysterectomy 2007 complete myomectomy 2008 complete hysterectomy 2012 tonsilectomy  Social History   Social History  . Marital Status: Single    Spouse Name: N/A  . Number of Children: N/A  . Years of Education: N/A   Occupational History  . Not on file.   Social History Main Topics  . Smoking status: Never Smoker   . Smokeless tobacco: Not on file  . Alcohol Use: Not on file  . Drug Use: Not on file  . Sexual Activity: Not on file   Other Topics Concern  . Not on file   Social History Narrative   Single, no children, works as a Quarry manager. Denies etoh and smoking.   Allergies  Allergen Reactions  . Penicillins Other (See Comments)    Syncope  . Lisinopril Cough     Outpatient Prescriptions Prior to Visit  Medication Sig Dispense Refill  . carisoprodol (SOMA) 350 MG tablet Take 350 mg by mouth daily.    . celecoxib (CELEBREX) 200 MG capsule Take 200 mg by mouth 2 (two) times daily.    . cholecalciferol (VITAMIN D) 1000 units tablet Take 1,000 Units by mouth daily.    Marland Kitchen estradiol (CLIMARA - DOSED IN MG/24 HR) 0.075 mg/24hr patch Place 0.075 mg onto the skin once a week.    . lidocaine (LIDODERM) 5 % Place 1 patch onto the skin daily. Remove & Discard patch within 12 hours or as directed by MD    . Magnesium 200 MG TABS Take 1 tablet by mouth daily.    . Vortioxetine HBr (TRINTELLIX) 20 MG TABS Take by mouth.    . zonisamide (ZONEGRAN) 100 MG capsule Take 100 mg by mouth  daily.    . Ginger, Zingiber officinalis, 500 MG CAPS Take by mouth.    . levothyroxine (SYNTHROID, LEVOTHROID) 25 MCG tablet Take 25 mcg by mouth daily before breakfast.    . zolpidem (AMBIEN CR) 12.5 MG CR tablet Take 12.5 mg by mouth at bedtime as needed for sleep. Reported on 08/19/2015    . Armodafinil (NUVIGIL) 150 MG tablet Take 150 mg by mouth daily.    . temazepam (RESTORIL) 30 MG capsule Take 30 mg by mouth at bedtime as needed for sleep.     No facility-administered medications prior to visit.   Meds ordered this encounter  Medications  . doxycycline (VIBRA-TABS) 100 MG tablet  Sig: Take 2 tablets by mouth 2 (two) times daily.    Refill:  0  . progesterone (PROMETRIUM) 100 MG capsule    Sig: Take 200 mg by mouth at bedtime.    Refill:  3  . propranolol ER (INDERAL LA) 80 MG 24 hr capsule    Sig: Take 80 mg by mouth daily.    Refill:  5  . thyroid (NATURE-THROID) 32.5 MG tablet    Sig: Take 32.5 mg by mouth daily.           Objective:   Physical Exam  Vitals:  Filed Vitals:   08/19/15 1139  BP: 128/68  Pulse: 48  Height: 5\' 8"  (1.727 m)  Weight: 175 lb (79.379 kg)  SpO2: 98%    Constitutional/General:  Pleasant, well-nourished, well-developed, not in any distress,  Comfortably seating.  Well kempt. VERY AWAKE AND ALERT. No note of falling asleep seen despite not sleeping for 3 days per her account.   Body mass index is 26.61 kg/(m^2). Wt Readings from Last 3 Encounters:  08/19/15 175 lb (79.379 kg)    HEENT: Pupils equal and reactive to light and accommodation. Anicteric sclerae. Normal nasal mucosa.   No oral  lesions,  mouth clear,  oropharynx clear, no postnasal drip. (-) Oral thrush. No dental caries.  Airway - Mallampati class III  Neck: No masses. Midline trachea. No JVD, (-) LAD. (-) bruits appreciated.  Respiratory/Chest: Grossly normal chest. (-) deformity. (-) Accessory muscle use.  Symmetric expansion. (-) Tenderness on palpation.    Resonant on percussion.  Diminished BS on both lower lung zones. (-) wheezing, crackles, rhonchi (-) egophony  Cardiovascular: Regular rate and  rhythm, heart sounds normal, no murmur or gallops, no peripheral edema  Gastrointestinal:  Normal bowel sounds. Soft, non-tender. No hepatosplenomegaly.  (-) masses.   Musculoskeletal:  Normal muscle tone. Normal gait.   Extremities: Grossly normal. (-) clubbing, cyanosis.  (-) edema  Skin: (-) rash,lesions seen.   Neurological/Psychiatric : alert, oriented to time, place, person. Normal mood and affect            Assessment & Plan:  Insomnia Pt with chronic insomnia which I think is 2/2 mental health issues with depression and anxiety. See above HPI.  She sees a mental health person frequently. Currently on trintellix (SSRI) for depression. For insomnia, she prefers organic meds with melatonin, GABA, taurine. What worked in the past were Hewlett-[employer]ardambien cr 12.5 mg for 2-3 nights alternating with temazepam 30 mg for 2-3 nights.  She has good sleep hygiene. We need to R/O OSA and try cpap and see if it will help her get good quality sleep.  If she needs meds for sleeping later on, I want to try trazodone 25 mg at HS. The 50 mg made her hung over I think.  May also benefit fro CBT (again).   Hypersomnia Has hypersomnia, snoring, fatigue, crowded airway. Hypersomnia affects fxnality. ESS 10. Needs an inlab study.   Depression Cont SSRI. Sees psychiatrist frequently.  Needs a sedating AD. May also benefit from an anxiolytic.  She wakes up at 3am, 4am, 5am every morning like clockwork.     Thank you very much for letting me participate in this patient's care. Please do not hesitate to give me a call if you have any questions or concerns regarding the treatment plan.   Patient will follow up with me in 2-3 months    J. Alexis FrockAngelo A. de Dios, MD 08/20/2015   1:45 AM Pulmonary  and Critical Care Medicine [facility] : [facility] Pager:  [phone] Office: [phone], Fax: [phone]

## 2015-08-19 NOTE — Patient Instructions (Signed)
   It was a pleasure taking care of you today!  We will schedule you to have a sleep study to determine if you have sleep apnea.    We will get a lab sleep study.  You will be scheduled to have a lab sleep study in 4-6 weeks.  Someone from the sleep lab will call you in 2-3 days to schedule the study with you.  They usually have cancellations every night so most likely, they will have openings for a lab sleep study next week or so.  We encourage you to do your sleep study then if possible. Please give us a call in a week is no one from the sleep lab calls you in 2-3 days.   If the sleep study is positive, we will order you a CPAP  machine.  Please call the office if you do NOT receive your machine in the next 1-2 weeks.   Please make sure you use your CPAP device everytime you sleep.  We will monitor the usage of your machine per your insurance requirement.  Your insurance company may take the machine from you if you are not using it regularly.   Please clean the mask, tubings, filter, water reservoir with soapy water every week.  Please use distilled water for the water reservoir.   Please call the office or your machine provider (DME company) if you are having issues with the device.   Return to clinic in 2-3 mos with Dr. Christene Slatese Dios

## 2015-08-20 DIAGNOSIS — F329 Major depressive disorder, single episode, unspecified: Secondary | ICD-10-CM | POA: Insufficient documentation

## 2015-08-20 DIAGNOSIS — G4710 Hypersomnia, unspecified: Secondary | ICD-10-CM | POA: Insufficient documentation

## 2015-08-20 DIAGNOSIS — G4700 Insomnia, unspecified: Secondary | ICD-10-CM | POA: Insufficient documentation

## 2015-08-20 DIAGNOSIS — F32A Depression, unspecified: Secondary | ICD-10-CM | POA: Insufficient documentation

## 2015-08-20 NOTE — Assessment & Plan Note (Addendum)
Cont SSRI. Sees psychiatrist frequently.  Needs a sedating AD. May also benefit from an anxiolytic.  She wakes up at 3am, 4am, 5am every morning like clockwork.

## 2015-08-20 NOTE — Assessment & Plan Note (Addendum)
Pt with chronic insomnia which I think is 2/2 mental health issues with depression and anxiety. See above HPI.  She sees a mental health person frequently. Currently on trintellix (SSRI) for depression. For insomnia, she prefers organic meds with melatonin, GABA, taurine. What worked in the past were Hewlett-Packardambien cr 12.5 mg for 2-3 nights alternating with temazepam 30 mg for 2-3 nights.  She has good sleep hygiene. We need to R/O OSA and try cpap and see if it will help her get good quality sleep.  If she needs meds for sleeping later on, I want to try trazodone 25 mg at HS. The 50 mg made her hung over I think.  May also benefit fro CBT (again).

## 2015-08-20 NOTE — Assessment & Plan Note (Signed)
Has hypersomnia, snoring, fatigue, crowded airway. Hypersomnia affects fxnality. ESS 10. Needs an inlab study.

## 2015-08-22 DIAGNOSIS — G5600 Carpal tunnel syndrome, unspecified upper limb: Secondary | ICD-10-CM | POA: Diagnosis not present

## 2015-08-23 DIAGNOSIS — M545 Low back pain: Secondary | ICD-10-CM | POA: Diagnosis not present

## 2015-08-23 DIAGNOSIS — G5600 Carpal tunnel syndrome, unspecified upper limb: Secondary | ICD-10-CM | POA: Diagnosis not present

## 2015-08-24 DIAGNOSIS — G5600 Carpal tunnel syndrome, unspecified upper limb: Secondary | ICD-10-CM | POA: Diagnosis not present

## 2015-08-25 DIAGNOSIS — M545 Low back pain: Secondary | ICD-10-CM | POA: Diagnosis not present

## 2015-08-25 DIAGNOSIS — G5600 Carpal tunnel syndrome, unspecified upper limb: Secondary | ICD-10-CM | POA: Diagnosis not present

## 2015-08-30 DIAGNOSIS — G5600 Carpal tunnel syndrome, unspecified upper limb: Secondary | ICD-10-CM | POA: Diagnosis not present

## 2015-08-30 DIAGNOSIS — M545 Low back pain: Secondary | ICD-10-CM | POA: Diagnosis not present

## 2015-09-05 ENCOUNTER — Other Ambulatory Visit: Payer: Self-pay | Admitting: Pulmonary Disease

## 2015-09-05 DIAGNOSIS — G4710 Hypersomnia, unspecified: Secondary | ICD-10-CM

## 2015-09-06 DIAGNOSIS — G5600 Carpal tunnel syndrome, unspecified upper limb: Secondary | ICD-10-CM | POA: Diagnosis not present

## 2015-09-06 DIAGNOSIS — M545 Low back pain: Secondary | ICD-10-CM | POA: Diagnosis not present

## 2015-09-07 DIAGNOSIS — G5600 Carpal tunnel syndrome, unspecified upper limb: Secondary | ICD-10-CM | POA: Diagnosis not present

## 2015-09-08 DIAGNOSIS — M545 Low back pain: Secondary | ICD-10-CM | POA: Diagnosis not present

## 2015-09-08 DIAGNOSIS — G5600 Carpal tunnel syndrome, unspecified upper limb: Secondary | ICD-10-CM | POA: Diagnosis not present

## 2015-09-19 ENCOUNTER — Ambulatory Visit (INDEPENDENT_AMBULATORY_CARE_PROVIDER_SITE_OTHER): Payer: BLUE CROSS/BLUE SHIELD | Admitting: Family Medicine

## 2015-09-19 ENCOUNTER — Encounter: Payer: Self-pay | Admitting: Family Medicine

## 2015-09-19 VITALS — BP 121/79 | HR 79 | Temp 98.8°F | Resp 16 | Ht 68.0 in | Wt 179.1 lb

## 2015-09-19 DIAGNOSIS — G4700 Insomnia, unspecified: Secondary | ICD-10-CM

## 2015-09-19 DIAGNOSIS — M797 Fibromyalgia: Secondary | ICD-10-CM | POA: Diagnosis not present

## 2015-09-19 DIAGNOSIS — E039 Hypothyroidism, unspecified: Secondary | ICD-10-CM

## 2015-09-19 DIAGNOSIS — F32A Depression, unspecified: Secondary | ICD-10-CM

## 2015-09-19 DIAGNOSIS — F419 Anxiety disorder, unspecified: Principal | ICD-10-CM

## 2015-09-19 DIAGNOSIS — G5600 Carpal tunnel syndrome, unspecified upper limb: Secondary | ICD-10-CM | POA: Diagnosis not present

## 2015-09-19 DIAGNOSIS — M545 Low back pain: Secondary | ICD-10-CM | POA: Diagnosis not present

## 2015-09-19 DIAGNOSIS — G43909 Migraine, unspecified, not intractable, without status migrainosus: Secondary | ICD-10-CM

## 2015-09-19 DIAGNOSIS — F418 Other specified anxiety disorders: Secondary | ICD-10-CM

## 2015-09-19 DIAGNOSIS — F329 Major depressive disorder, single episode, unspecified: Secondary | ICD-10-CM | POA: Insufficient documentation

## 2015-09-19 NOTE — Patient Instructions (Signed)
We are going to get all of your information and see what we need to do Call with any questions or concerns Hang in there!  We'll try and figure this out!

## 2015-09-19 NOTE — Progress Notes (Signed)
   Subjective:    Patient ID: Abigail MomentNicole N Gomez, female    DOB: May 05, 1965, 50 y.o.   MRN: [id number]  HPI New to establish.  Previous MD- Dew[facility] Penning[facility]my Spears then [facility] ('not a pleasant experience')  Fibromyalgia- chronic problem, seeing Dr Corliss Skainseveshwar.  Currently on Soma.  In acupuncture and PT.  + fatigue.  Pt had dizziness and weight gain on Lyrica.  Previously on Savella.    Insomnia- chronic problem, has had several sleep studies.  Now seeing Dr Eyvonne Mechanici Dios for another sleep study.  Pt reports there are 3-4 days at a time that she won't sleep.  Pt has RLS.  No relief w/ Trazodone, 'it gives me a really bad hangover'.  Minimal improvement w/ Ambien.  Migraines- chronic problem, started in HS.  Following w/ Dr Neale BurlyFreeman.  On Maxalt prn and Zonegran and propranolol ER daily.  Anxiety/depression- chronic problem, seeing Deatra RobinsonKaren Jones.  On Trintellix.  Pt reports provider is aware of her 3-4 day episodes w/o sleep.  Not on mood stabilizer.    [facility]roid- [facility] and is now on [place] Thyroid 32.5mg  daily.   Urinary incontinence- pt reports urgency w/o lead time to get to restroom.  Saw [facility] ago and had did pelvic floor exercises.    Overweight- ongoing issue for pt.  She reports weight will fluctuate.  Pt reports she just had blood work recently.  Review of Systems For ROS see HPI     Objective:   Physical Exam  Constitutional: She is oriented to person, place, and time. She appears well-developed and well-nourished. No distress.  HENT:  Head: Normocephalic and atraumatic.  Eyes: Conjunctivae and EOM are normal. Pupils are equal, round, and reactive to light.  Neck: Normal range of motion. Neck supple. No thyromegaly present.  Cardiovascular: Normal rate, regular rhythm, normal heart sounds and intact distal pulses.   No murmur heard. Pulmonary/Chest: Effort normal and breath sounds normal. No respiratory distress.  Abdominal: Soft. She exhibits no  distension. There is no tenderness.  Musculoskeletal: She exhibits no edema.  Lymphadenopathy:    She has no cervical adenopathy.  Neurological: She is alert and oriented to person, place, and time.  Skin: Skin is warm and dry.  Psychiatric: She has a normal mood and affect. Her behavior is normal.  Vitals reviewed.         Assessment & Plan:

## 2015-09-19 NOTE — Progress Notes (Signed)
Pre visit review using our clinic review tool, if applicable. No additional management support is needed unless otherwise documented below in the visit note. 

## 2015-09-20 DIAGNOSIS — M545 Low back pain: Secondary | ICD-10-CM | POA: Diagnosis not present

## 2015-09-20 DIAGNOSIS — G5600 Carpal tunnel syndrome, unspecified upper limb: Secondary | ICD-10-CM | POA: Diagnosis not present

## 2015-09-20 NOTE — Assessment & Plan Note (Signed)
New to provider, ongoing for pt.  Was started on Nature-Thyroid [facility].  Pt reports she recently had labs done- will request records for review.  Pt complains of near constant fatigue despite being on thyroid medication.  Will follow along.

## 2015-09-20 NOTE — Assessment & Plan Note (Signed)
New to provider, ongoing for pt.  Following w/ Dr Neale BurlyFreeman.  On Zonegran, Propranolol regularly and Maxalt prn.  Will follow along and assist as able.

## 2015-09-20 NOTE — Assessment & Plan Note (Signed)
New to provider, ongoing for pt.  Pt has a sleep study pending.  She reports feeling hung over on Trazodone and only minimal improvement on Ambien.  Pt is seeing Pulmonary for this.  Will follow along.

## 2015-09-20 NOTE — Assessment & Plan Note (Signed)
New to provider, ongoing for pt.  She reports that she has seen multiple providers and utilized both traditional and nontraditional tx options for her ongoing pain and fatigue.  She is very skeptical of almost all medical providers and is very dissatisfied w/ her current care.  Given the very complete work up she has had, multiple tx options, and the fact that she is following w/ Rheumatology I am not sure there is anything I can offer to improve her current situation.  Discussed the possibility of a referral to an [facility].  Pt states she will think about this.  Will review recent records once they are available and determine the next steps.  Pt expressed understanding and is in agreement w/ plan.

## 2015-09-20 NOTE — Assessment & Plan Note (Signed)
New to provider, ongoing for pt.  Following w/ psychiatry and also has a therapist.  Currently on Trintellix but this does not seem to be controlling her sxs.  Pt seems very angry in office today.  She was not inappropriate w/ me, just angry in general.  Encouraged her to discuss her lack of sx control w/ her provider.  Will follow.

## 2015-09-21 DIAGNOSIS — G5600 Carpal tunnel syndrome, unspecified upper limb: Secondary | ICD-10-CM | POA: Diagnosis not present

## 2015-09-26 DIAGNOSIS — G5600 Carpal tunnel syndrome, unspecified upper limb: Secondary | ICD-10-CM | POA: Diagnosis not present

## 2015-09-26 DIAGNOSIS — M545 Low back pain: Secondary | ICD-10-CM | POA: Diagnosis not present

## 2015-09-27 DIAGNOSIS — G5600 Carpal tunnel syndrome, unspecified upper limb: Secondary | ICD-10-CM | POA: Diagnosis not present

## 2015-09-28 DIAGNOSIS — G5600 Carpal tunnel syndrome, unspecified upper limb: Secondary | ICD-10-CM | POA: Diagnosis not present

## 2015-09-29 DIAGNOSIS — G5600 Carpal tunnel syndrome, unspecified upper limb: Secondary | ICD-10-CM | POA: Diagnosis not present

## 2015-09-29 DIAGNOSIS — M545 Low back pain: Secondary | ICD-10-CM | POA: Diagnosis not present

## 2015-09-30 ENCOUNTER — Encounter (HOSPITAL_BASED_OUTPATIENT_CLINIC_OR_DEPARTMENT_OTHER): Payer: BLUE CROSS/BLUE SHIELD

## 2015-10-10 DIAGNOSIS — M7672 Peroneal tendinitis, left leg: Secondary | ICD-10-CM | POA: Diagnosis not present

## 2015-10-10 DIAGNOSIS — M7662 Achilles tendinitis, left leg: Secondary | ICD-10-CM | POA: Diagnosis not present

## 2015-10-10 DIAGNOSIS — G5600 Carpal tunnel syndrome, unspecified upper limb: Secondary | ICD-10-CM | POA: Diagnosis not present

## 2015-10-10 DIAGNOSIS — M545 Low back pain: Secondary | ICD-10-CM | POA: Diagnosis not present

## 2015-10-10 DIAGNOSIS — M958 Other specified acquired deformities of musculoskeletal system: Secondary | ICD-10-CM | POA: Diagnosis not present

## 2015-10-10 DIAGNOSIS — M7601 Gluteal tendinitis, right hip: Secondary | ICD-10-CM | POA: Diagnosis not present

## 2015-10-10 DIAGNOSIS — M19072 Primary osteoarthritis, left ankle and foot: Secondary | ICD-10-CM | POA: Diagnosis not present

## 2015-10-10 DIAGNOSIS — S73191A Other sprain of right hip, initial encounter: Secondary | ICD-10-CM | POA: Diagnosis not present

## 2015-10-12 DIAGNOSIS — G4733 Obstructive sleep apnea (adult) (pediatric): Secondary | ICD-10-CM | POA: Diagnosis not present

## 2015-10-12 DIAGNOSIS — F411 Generalized anxiety disorder: Secondary | ICD-10-CM | POA: Diagnosis not present

## 2015-10-13 DIAGNOSIS — G5600 Carpal tunnel syndrome, unspecified upper limb: Secondary | ICD-10-CM | POA: Diagnosis not present

## 2015-10-13 DIAGNOSIS — M545 Low back pain: Secondary | ICD-10-CM | POA: Diagnosis not present

## 2015-10-17 ENCOUNTER — Telehealth: Payer: Self-pay | Admitting: Pulmonary Disease

## 2015-10-17 DIAGNOSIS — G4733 Obstructive sleep apnea (adult) (pediatric): Secondary | ICD-10-CM

## 2015-10-17 NOTE — Telephone Encounter (Signed)
Spoke with pt and gave results and recommendations. Pt agrees to start CPAP therapy. Order placed. Pt aware to call office and schedule f/u appt once starts CPAP. Nothing further needed.  

## 2015-10-17 NOTE — Telephone Encounter (Signed)
  Please call the pt and tell the pt the HOME SLEEP STUDY  showed OSA  Pt stops breathing 8   times an hour.   Home sleep study was done on : 10/12/15  Please order autoCPAP 5-15 cm H2O. Patient will need a mask fitting session. Patient will need a 1 month download.   Patient needs to be seen by me or any of the NPs/APPs  4-6 weeks after obtaining the cpap machine. Let me know if you receive this.   Thanks!   J. Alexis FrockAngelo A de Dios, MD 10/17/2015, 3:38 AM

## 2015-10-17 NOTE — Telephone Encounter (Signed)
Patient returned call.  Asked to leave results on VM as she "does not have time to play phone tag".  CB is [phone]

## 2015-10-17 NOTE — Telephone Encounter (Signed)
LM x 1 

## 2015-10-18 DIAGNOSIS — G43019 Migraine without aura, intractable, without status migrainosus: Secondary | ICD-10-CM | POA: Diagnosis not present

## 2015-10-18 DIAGNOSIS — G43719 Chronic migraine without aura, intractable, without status migrainosus: Secondary | ICD-10-CM | POA: Diagnosis not present

## 2015-10-20 DIAGNOSIS — G5600 Carpal tunnel syndrome, unspecified upper limb: Secondary | ICD-10-CM | POA: Diagnosis not present

## 2015-10-20 DIAGNOSIS — M545 Low back pain: Secondary | ICD-10-CM | POA: Diagnosis not present

## 2015-10-24 DIAGNOSIS — G4733 Obstructive sleep apnea (adult) (pediatric): Secondary | ICD-10-CM | POA: Diagnosis not present

## 2015-10-25 ENCOUNTER — Other Ambulatory Visit: Payer: Self-pay | Admitting: *Deleted

## 2015-10-25 DIAGNOSIS — G4710 Hypersomnia, unspecified: Secondary | ICD-10-CM

## 2015-11-01 DIAGNOSIS — M545 Low back pain: Secondary | ICD-10-CM | POA: Diagnosis not present

## 2015-11-01 DIAGNOSIS — G5600 Carpal tunnel syndrome, unspecified upper limb: Secondary | ICD-10-CM | POA: Diagnosis not present

## 2015-11-07 ENCOUNTER — Telehealth: Payer: Self-pay

## 2015-11-07 DIAGNOSIS — M25572 Pain in left ankle and joints of left foot: Secondary | ICD-10-CM | POA: Diagnosis not present

## 2015-11-07 DIAGNOSIS — M25551 Pain in right hip: Secondary | ICD-10-CM | POA: Diagnosis not present

## 2015-11-07 NOTE — Telephone Encounter (Signed)
LMTCB to reschedule appt as pt has not started CPAP therapy yet.

## 2015-11-08 ENCOUNTER — Ambulatory Visit: Payer: BLUE CROSS/BLUE SHIELD | Admitting: Pulmonary Disease

## 2015-11-08 DIAGNOSIS — G4733 Obstructive sleep apnea (adult) (pediatric): Secondary | ICD-10-CM | POA: Diagnosis not present

## 2015-11-08 NOTE — Telephone Encounter (Signed)
Spoke with pt. appt cancelled. She will call back once she started CPAP.

## 2015-11-24 DIAGNOSIS — M25551 Pain in right hip: Secondary | ICD-10-CM | POA: Diagnosis not present

## 2015-12-06 DIAGNOSIS — M25551 Pain in right hip: Secondary | ICD-10-CM | POA: Diagnosis not present

## 2015-12-09 DIAGNOSIS — N959 Unspecified menopausal and perimenopausal disorder: Secondary | ICD-10-CM | POA: Diagnosis not present

## 2015-12-09 DIAGNOSIS — R5383 Other fatigue: Secondary | ICD-10-CM | POA: Diagnosis not present

## 2015-12-09 DIAGNOSIS — A6920 Lyme disease, unspecified: Secondary | ICD-10-CM | POA: Diagnosis not present

## 2015-12-09 DIAGNOSIS — E039 Hypothyroidism, unspecified: Secondary | ICD-10-CM | POA: Diagnosis not present

## 2015-12-09 DIAGNOSIS — G4733 Obstructive sleep apnea (adult) (pediatric): Secondary | ICD-10-CM | POA: Diagnosis not present

## 2015-12-14 ENCOUNTER — Telehealth: Payer: Self-pay | Admitting: Pulmonary Disease

## 2015-12-14 DIAGNOSIS — F411 Generalized anxiety disorder: Secondary | ICD-10-CM | POA: Diagnosis not present

## 2015-12-14 NOTE — Telephone Encounter (Signed)
LMTCB

## 2015-12-14 NOTE — Telephone Encounter (Signed)
   DL x last month : 40%83%, AHI 1.3 autocpap 5-15 cm water.  Cont cpap.  Pls tell pt she is doing well on cpap. Needs f/u per insurance requirment with me or NP.  Pollie MeyerJ. Angelo A de Dios, MD 12/14/2015, 12:57 PM [place] [facility] Pager [phone] After 3 pm or if no answer, call [phone]

## 2015-12-20 ENCOUNTER — Encounter: Payer: Self-pay | Admitting: Pulmonary Disease

## 2015-12-20 NOTE — Telephone Encounter (Signed)
LMTCB and schedule appt for CPAP f/u

## 2015-12-22 NOTE — Telephone Encounter (Signed)
Pt has made appt with AD 01/05/16.

## 2016-01-02 ENCOUNTER — Telehealth: Payer: Self-pay | Admitting: Family Medicine

## 2016-01-02 DIAGNOSIS — Z23 Encounter for immunization: Secondary | ICD-10-CM | POA: Diagnosis not present

## 2016-01-02 DIAGNOSIS — M797 Fibromyalgia: Secondary | ICD-10-CM

## 2016-01-02 DIAGNOSIS — M9901 Segmental and somatic dysfunction of cervical region: Secondary | ICD-10-CM | POA: Diagnosis not present

## 2016-01-02 DIAGNOSIS — M9903 Segmental and somatic dysfunction of lumbar region: Secondary | ICD-10-CM | POA: Diagnosis not present

## 2016-01-02 DIAGNOSIS — M9902 Segmental and somatic dysfunction of thoracic region: Secondary | ICD-10-CM | POA: Diagnosis not present

## 2016-01-02 DIAGNOSIS — M47812 Spondylosis without myelopathy or radiculopathy, cervical region: Secondary | ICD-10-CM | POA: Diagnosis not present

## 2016-01-02 NOTE — Telephone Encounter (Signed)
Pt called back asking if she can have a TB test done, pt states that she needs this for work.

## 2016-01-02 NOTE — Telephone Encounter (Signed)
Pt calling stating that she never heard back from KT on what the next thing to do, Kt was waiting on MR from other providers. Pt asking for referral to a rheumatologist.

## 2016-01-03 DIAGNOSIS — Z9989 Dependence on other enabling machines and devices: Secondary | ICD-10-CM | POA: Diagnosis not present

## 2016-01-03 DIAGNOSIS — G4733 Obstructive sleep apnea (adult) (pediatric): Secondary | ICD-10-CM | POA: Diagnosis not present

## 2016-01-03 DIAGNOSIS — G43009 Migraine without aura, not intractable, without status migrainosus: Secondary | ICD-10-CM | POA: Diagnosis not present

## 2016-01-03 DIAGNOSIS — I10 Essential (primary) hypertension: Secondary | ICD-10-CM | POA: Diagnosis not present

## 2016-01-04 DIAGNOSIS — M9903 Segmental and somatic dysfunction of lumbar region: Secondary | ICD-10-CM | POA: Diagnosis not present

## 2016-01-04 DIAGNOSIS — M9901 Segmental and somatic dysfunction of cervical region: Secondary | ICD-10-CM | POA: Diagnosis not present

## 2016-01-04 DIAGNOSIS — M47812 Spondylosis without myelopathy or radiculopathy, cervical region: Secondary | ICD-10-CM | POA: Diagnosis not present

## 2016-01-04 DIAGNOSIS — M9902 Segmental and somatic dysfunction of thoracic region: Secondary | ICD-10-CM | POA: Diagnosis not present

## 2016-01-05 ENCOUNTER — Ambulatory Visit (INDEPENDENT_AMBULATORY_CARE_PROVIDER_SITE_OTHER): Payer: BLUE CROSS/BLUE SHIELD | Admitting: Pulmonary Disease

## 2016-01-05 ENCOUNTER — Encounter: Payer: Self-pay | Admitting: Pulmonary Disease

## 2016-01-05 VITALS — BP 108/76 | HR 50 | Ht 68.0 in | Wt 186.6 lb

## 2016-01-05 DIAGNOSIS — G4710 Hypersomnia, unspecified: Secondary | ICD-10-CM

## 2016-01-05 DIAGNOSIS — F32A Depression, unspecified: Secondary | ICD-10-CM

## 2016-01-05 DIAGNOSIS — G4733 Obstructive sleep apnea (adult) (pediatric): Secondary | ICD-10-CM

## 2016-01-05 DIAGNOSIS — F329 Major depressive disorder, single episode, unspecified: Secondary | ICD-10-CM

## 2016-01-05 NOTE — Assessment & Plan Note (Signed)
Cont SSRI. Sees psychiatrist frequently.  May need a sedating AD. May also benefit from an anxiolytic.  She wakes up at 3am, 4am, 5am every morning like clockwork. This is better with cpap so will hold off on med changes.

## 2016-01-05 NOTE — Telephone Encounter (Signed)
Ok for TB test (or quantiferon depending on her preference/schedule) Ok for rheum referral for fibro (although this may take quite awhile b/c a lot of rheumatologists are no longer accepting fibro patients).  [facility]

## 2016-01-05 NOTE — Telephone Encounter (Signed)
Called pt and LMOVM to return call.  °

## 2016-01-05 NOTE — Assessment & Plan Note (Signed)
Has hypersomnia, snoring, fatigue, crowded airway. Hypersomnia affects fxnality. ESS 10.   HST 11/2015 AHI 8. On auto CPAP 5-15 centimeters water. Download for October 2017: 83% compliance, AHI 1.  Overall feels better. She is benefit of CPAP. Less sleepiness. Less hypersomnia. Still with sleep onset insomnia but get awakenings are definitely better. She feels more rested with CPAP.  She has nasal pillows. Wakes up with a dry mouth.  Plan : We extensively discussed the importance of treating OSA and the need to use PAP therapy.   Continue with autocpap 5-15 cm water. Continue nasal pillows. Will need a chinstrap. Discussed with her other masks such as Dreamwear, For her nasal mask, Amara mask just in case she wants to try other masks.    Patient was instructed to have mask, tubings, filter, reservoir cleaned at least once a week with soapy water.  Patient was instructed to call the office if he/she is having issues with the PAP device.    I advised patient to obtain sufficient amount of sleep --  7 to 8 hours at least in a 24 hr period.  Patient was advised to follow good sleep hygiene.  Patient was advised NOT to engage in activities requiring concentration and/or vigilance if he/she is and  sleepy.  Patient is NOT to drive if he/she is sleepy.   We discussed good sleep hygiene again.

## 2016-01-05 NOTE — Telephone Encounter (Signed)
Referral was placed today for Rheumatology.   Please advise if pt would prefer the quantiferon- Lab Draw or the PPD nurse visit. I will order after she is scheduled.

## 2016-01-05 NOTE — Assessment & Plan Note (Signed)
Pt with chronic insomnia which I think is 2/2 mental health issues with depression and anxiety. See above HPI.  She sees a mental health person frequently. Currently on trintellix (SSRI) for depression. For insomnia, she prefers organic meds with melatonin, GABA, taurine. What worked in the past were Hewlett-[employer]ardambien cr 12.5 mg for 2-3 nights alternating with temazepam 30 mg for 2-3 nights.  She has good sleep hygiene.   HST in 11/2015 > AHI 8. Currently an auto CPAP 5-15 centimeters water. She is better using it. Less awakenings at night. Still with sleep onset insomnia. Overall, she is better with CPAP.  Continue to follow up with her mental health person.  If she needs meds for sleeping later on, I want to try trazodone 25 mg at HS. The 50 mg made her hung over I think.  May also benefit fro CBT (again).

## 2016-01-05 NOTE — Progress Notes (Signed)
Subjective:    Patient ID: Abigail Gomez, female    DOB: [birth year], 50 y.o.   MRN: [id number]  HPI   This is the case of Abigail Gomez, 50 y.o. Female, who was referred by Philipp Ovens  in consultation regarding sleep issues and insomnia.   As you very well know, patient is a non smoker, not been dxed with asthma or copd.  Pt is here for chronic sleep issues/insomnia.   No issues falling asleep until 1987. Pt moved from [place] to [place] in 1987 for college. She denies stress or trauma at that time. She noticed that her body clock just switched and she became "active and awake" at night and "very sleepy and tired" during daytime.  She let this persist and become her norm.  She ended up taking afternoon and evening classes and would stay up until early am to do her homework and would sleep at around 3-4am and wake up at noon-ish.   After college, it was a struggle for her to be awake during the morning but she had no choice since she had to work. She ended up seeing a psychiatrist and neurologist in the years to come to help her with her insomnia/depression/anxiety.  She also did CBT and biofeedback to help her with her insomnia.  She has tried almost all medicines to help her sleep.  For the most part these medicines initially made her go to sleep but she would get reistant to these meds.   From 2006 until 2016, what worked best to let her fall asleep are ambien CR 12.5 mg for 2-3 nights alternating with temazepam 30 mg for 2-3 nights.  She would then have 2-3 nights w/o meds as a "wash out" period.  These meds worked to make her fall asleep at night but she decided to stop in 2016 since she did not want to be dependent on meds.    Since 2016, she has tried melatonin 10 mg, GAB 750, Taurine 800 mg. These are organic meds and are not as effective.   Pt has also tried acupuncture to no avail.  Pt has tried the following meds through the yrs : lunesta, sonata, seroquel, remeron,  trazodone (made her hung over), requip, mirapex, rozerem, belsomra.  These meds did NOT help her fall asleep.   She has good sleep hygiene presently.  Wakes up at 7am daily.  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 Works and is on the road until [employer].  Goes home and goes to bed at 11pm or 1130pm.  Sleeping time varies, can be anywhere from 1-2 hrs.  ALWAYS wakes up at 3am, 4am, 5 am like clockwork.  She says she has recently fallen asleep driving and she di not want to elaborate.  She says she has NOT slept in 3days.   Pt uses lavender oils and drinks camomille tea.  She keeps her room dark and conducive to sleeping.   Pt has had 3 sleep studies and they have all been (-). Last study was in early 2000.   Some snoring. Has hypersomnia. (-) gasping, choking. Has hypersomnia and it affects her fxnality. (-) witnessed apneas. ESS 10.  (-) abnormal behavior in sleep. (-) cataplexy. occasionla sleep paralysis. (-) hypnogogic or hypnopompic hallucinations.   ROV 01/05/16 Patient returns to the office as follow-up on her sleep apnea/insomnia. Since last seen, she had a home sleep test which showed an AHI of 8. She was started  on auto CPAP 5-15 centimeters water. Tolerating well. Less sleepiness. Feels better. Feels benefit of CPAP.  Goes to bed at 10 pm, falls asleep after couple of hours. She stays asleep longer with cpap unlike before. Less awakenings with cpap. Feels more rested with sleep and cpap.  Still has chronic insomnia but better.  She uses nasal pillows and complians of dry mouth.   Review of Systems  Constitutional: Negative.  Negative for fever and unexpected weight change.  HENT: Negative for congestion, dental problem, ear pain, nosebleeds, postnasal drip, rhinorrhea, sinus pressure, sneezing, sore throat and trouble swallowing.   Eyes: Negative.  Negative for redness and itching.  Respiratory: Negative.  Negative for cough, chest tightness, shortness of breath and  wheezing.   Cardiovascular: Negative for palpitations and leg swelling.  Gastrointestinal: Negative.  Negative for nausea and vomiting.  Endocrine: Negative.   Genitourinary: Negative.  Negative for dysuria.  Musculoskeletal: Positive for myalgias. Negative for arthralgias and joint swelling.  Skin: Negative.  Negative for rash.  Allergic/Immunologic: Negative.   Neurological: Negative for dizziness, light-headedness and headaches.  Hematological: Does not bruise/bleed easily.  Psychiatric/Behavioral: Negative.  Negative for dysphoric mood. The patient is not nervous/anxious.         Objective:   Physical Exam  Vitals:  Vitals:   01/05/16 1014  BP: 108/76  Pulse: (!) 50  SpO2: 98%  Weight: 186 lb 9.6 oz (84.6 kg)  Height: 5\' 8"  (1.727 m)    Constitutional/General:  Pleasant, well-nourished, well-developed, not in any distress,  Comfortably seating.  Well kempt.   Body mass index is 28.37 kg/m. Wt Readings from Last 3 Encounters:  01/05/16 186 lb 9.6 oz (84.6 kg)  09/19/15 179 lb 2 oz (81.3 kg)  08/19/15 175 lb (79.4 kg)    HEENT: Pupils equal and reactive to light and accommodation. Anicteric sclerae. Normal nasal mucosa.   No oral  lesions,  mouth clear,  oropharynx clear, no postnasal drip. (-) Oral thrush. No dental caries.  Airway - Mallampati class III  Neck: No masses. Midline trachea. No JVD, (-) LAD. (-) bruits appreciated.  Respiratory/Chest: Grossly normal chest. (-) deformity. (-) Accessory muscle use.  Symmetric expansion. (-) Tenderness on palpation.  Resonant on percussion.  Diminished BS on both lower lung zones. (-) wheezing, crackles, rhonchi (-) egophony  Cardiovascular: Regular rate and  rhythm, heart sounds normal, no murmur or gallops, no peripheral edema  Gastrointestinal:  Normal bowel sounds. Soft, non-tender. No hepatosplenomegaly.  (-) masses.   Musculoskeletal:  Normal muscle tone. Normal gait.   Extremities: Grossly normal. (-)  clubbing, cyanosis.  (-) edema  Skin: (-) rash,lesions seen.   Neurological/Psychiatric : alert, oriented to time, place, person. Normal mood and affect            Assessment & Plan:  Insomnia Pt with chronic insomnia which I think is 2/2 mental health issues with depression and anxiety. See above HPI.  She sees a mental health person frequently. Currently on trintellix (SSRI) for depression. For insomnia, she prefers organic meds with melatonin, GABA, taurine. What worked in the past were [employer] cr 12.5 mg for 2-3 nights alternating with temazepam 30 mg for 2-3 nights.  She has good sleep hygiene.   HST in 11/2015 > AHI 8. Currently an auto CPAP 5-15 centimeters water. She is better using it. Less awakenings at night. Still with sleep onset insomnia. Overall, she is better with CPAP.  Continue to follow up with her mental health person.  If  she needs meds for sleeping later on, I want to try trazodone 25 mg at HS. The 50 mg made her hung over I think.  May also benefit fro CBT (again).   Hypersomnia Has hypersomnia, snoring, fatigue, crowded airway. Hypersomnia affects fxnality. ESS 10.   HST 11/2015 AHI 8. On auto CPAP 5-15 centimeters water. Download for October 2017: 83% compliance, AHI 1.  Overall feels better. She is benefit of CPAP. Less sleepiness. Less hypersomnia. Still with sleep onset insomnia but get awakenings are definitely better. She feels more rested with CPAP.  She has nasal pillows. Wakes up with a dry mouth.  Plan : We extensively discussed the importance of treating OSA and the need to use PAP therapy.   Continue with autocpap 5-15 cm water. Continue nasal pillows. Will need a chinstrap. Discussed with her other masks such as Dreamwear, For her nasal mask, Amara mask just in case she wants to try other masks.    Patient was instructed to have mask, tubings, filter, reservoir cleaned at least once a week with soapy water.  Patient was instructed  to call the office if he/she is having issues with the PAP device.    I advised patient to obtain sufficient amount of sleep --  7 to 8 hours at least in a 24 hr period.  Patient was advised to follow good sleep hygiene.  Patient was advised NOT to engage in activities requiring concentration and/or vigilance if he/she is and  sleepy.  Patient is NOT to drive if he/she is sleepy.   We discussed good sleep hygiene again.   Depression Cont SSRI. Sees psychiatrist frequently.  May need a sedating AD. May also benefit from an anxiolytic.  She wakes up at 3am, 4am, 5am every morning like clockwork. This is better with cpap so will hold off on med changes.      Patient will follow up with me in 6 mos.    Pollie MeyerJ. Angelo A. de Dios, MD 01/05/2016   10:50 AM Pulmonary and Critical Care Medicine [facility] Pager: [phone] Office: [phone], Fax: [phone]

## 2016-01-05 NOTE — Patient Instructions (Signed)
  It was a pleasure taking care of you today!  Continue using your CPAP machine. We will order you a chin strap.   Please make sure you use your CPAP device everytime you sleep.  We will monitor the usage of your machine per your insurance requirement.  Your insurance company may take the machine from you if you are not using it regularly.   Please clean the mask, tubings, filter, water reservoir with soapy water every week.  Please use distilled water for the water reservoir.   Please call the office or your machine provider (DME company) if you are having issues with the device.   Return to clinic in 6 months.

## 2016-01-06 ENCOUNTER — Ambulatory Visit: Payer: Self-pay | Admitting: Rheumatology

## 2016-01-08 DIAGNOSIS — G4733 Obstructive sleep apnea (adult) (pediatric): Secondary | ICD-10-CM | POA: Diagnosis not present

## 2016-01-10 ENCOUNTER — Telehealth (INDEPENDENT_AMBULATORY_CARE_PROVIDER_SITE_OTHER): Payer: Self-pay | Admitting: *Deleted

## 2016-01-10 NOTE — Telephone Encounter (Signed)
PT. Called stating injection did not help and since previously discussing a referral [facility] she would like to start this process as soon as possible. Pt. Requesting call back [phone].

## 2016-01-10 NOTE — Telephone Encounter (Signed)
Please advise 

## 2016-01-11 DIAGNOSIS — M5431 Sciatica, right side: Secondary | ICD-10-CM | POA: Diagnosis not present

## 2016-01-11 DIAGNOSIS — M5441 Lumbago with sciatica, right side: Secondary | ICD-10-CM | POA: Diagnosis not present

## 2016-01-11 DIAGNOSIS — M9904 Segmental and somatic dysfunction of sacral region: Secondary | ICD-10-CM | POA: Diagnosis not present

## 2016-01-11 DIAGNOSIS — M62830 Muscle spasm of back: Secondary | ICD-10-CM | POA: Diagnosis not present

## 2016-01-11 NOTE — Telephone Encounter (Signed)
I guess it is for further evaluation of her right hip.  The referral wound be to Dr. Renella CunasAlston Stubbs, [facility] to consider a hip arthroscopy for right hi osteochondral changes.

## 2016-01-13 ENCOUNTER — Other Ambulatory Visit (INDEPENDENT_AMBULATORY_CARE_PROVIDER_SITE_OTHER): Payer: Self-pay

## 2016-01-13 DIAGNOSIS — M25551 Pain in right hip: Secondary | ICD-10-CM

## 2016-01-13 NOTE — Telephone Encounter (Signed)
Referral sent to the pool for Dr. Cathi RoanStubbs/[facility]

## 2016-01-16 ENCOUNTER — Telehealth (INDEPENDENT_AMBULATORY_CARE_PROVIDER_SITE_OTHER): Payer: Self-pay | Admitting: *Deleted

## 2016-01-16 NOTE — Telephone Encounter (Signed)
Stanton KidneyDebra from [facility] called stating she hasn't received the referral or the office notes. She made the pt's appt but needs these re faxed to [phone].

## 2016-01-16 NOTE — Telephone Encounter (Signed)
See below, can you help?

## 2016-01-17 DIAGNOSIS — M5441 Lumbago with sciatica, right side: Secondary | ICD-10-CM | POA: Diagnosis not present

## 2016-01-17 DIAGNOSIS — M62830 Muscle spasm of back: Secondary | ICD-10-CM | POA: Diagnosis not present

## 2016-01-17 DIAGNOSIS — M5431 Sciatica, right side: Secondary | ICD-10-CM | POA: Diagnosis not present

## 2016-01-17 DIAGNOSIS — M9904 Segmental and somatic dysfunction of sacral region: Secondary | ICD-10-CM | POA: Diagnosis not present

## 2016-01-17 NOTE — Telephone Encounter (Signed)
Referral [facility] and [facility] attn Stanton Kidneyebra

## 2016-01-26 DIAGNOSIS — M7061 Trochanteric bursitis, right hip: Secondary | ICD-10-CM | POA: Insufficient documentation

## 2016-01-26 NOTE — Progress Notes (Signed)
Office Visit Note  Patient: Abigail Gomez             Date of Birth: 09-06-1965           MRN: [id number]             PCP: Neena RhymesKatherine Tabori, MD Referring: Sheliah Hatchabori, Katherine E, MD Visit Date: 02/01/2016 Occupation: Drug rep    Subjective:  Generalized pain   History of Present Illness: Abigail Gomez is a 50 y.o. female with history of fibromyalgia. She states she saw Dr. Rayburn MaBlackmon after her last visit due to pain in her right hip joint. She was initially diagnosed with early osteoarthritis and was given Celebrex for 30 days without any help. As her symptoms persist she had MRI of her right hip which showed labral tear and early osteoarthritis. She received a cortisone injection under fluoroscopy by Dr. Alvester MorinNewton without much relief. She states she's unable to do any exercise and yoga and she has gained weight because of that reason. She has an appointment coming up with [facility]well [facility] for [facility]. She is in a lot of discomfort she states none of the topical or oral medications are helpful she's been having nocturnal pain and lower back pain. She describes her pain on scale of 0-10 about 10 and fatigue on 0-10 about 6-7 she also had a sleep study done by Dr. Neale BurlyFreeman her neurologist which showed sleep apnea. She's been using a CPAP at nighttime which is been helpful . She has missed some days it to work due to ongoing hip discomfort.   Activities of Daily Living:  Patient reports morning stiffness for 60 minutes.   Patient Reports nocturnal pain.  Difficulty dressing/grooming: Denies Difficulty climbing stairs: Reports Difficulty getting out of chair: Reports Difficulty using hands for taps, buttons, cutlery, and/or writing: Denies   Review of Systems  Constitutional: Positive for fatigue and weight gain. Negative for night sweats, weight loss and weakness.  HENT: Positive for mouth dryness. Negative for mouth sores, trouble swallowing, trouble swallowing  and nose dryness.   Eyes: Positive for dryness. Negative for pain, redness and visual disturbance.  Respiratory: Negative for cough, shortness of breath and difficulty breathing.   Cardiovascular: Positive for hypertension. Negative for chest pain, palpitations, irregular heartbeat and swelling in legs/feet.  Gastrointestinal: Negative for blood in stool, constipation and diarrhea.  Endocrine: Negative for increased urination.  Genitourinary: Negative for vaginal dryness.  Musculoskeletal: Positive for arthralgias, joint pain, myalgias and myalgias. Negative for joint swelling, muscle weakness, morning stiffness and muscle tenderness.  Skin: Negative for color change, rash, hair loss, skin tightness, ulcers and sensitivity to sunlight.  Allergic/Immunologic: Negative for susceptible to infections.  Neurological: Negative for dizziness, memory loss and night sweats.  Hematological: Negative for swollen glands.  Psychiatric/Behavioral: Positive for sleep disturbance. Negative for depressed mood. The patient is nervous/anxious.     PMFS History:  Patient Active Problem List   Diagnosis Date Noted  . Obstructive sleep apnea syndrome 02/01/2016  . Pain of right hip joint 02/01/2016  . Atherosclerosis of native arteries of extremities with gangrene, right leg (HCC) 02/01/2016  . Hypertension 01/31/2016  . Restless leg syndrome 01/31/2016  . Sickle cell trait (HCC) 01/31/2016  . Gastroesophageal reflux 01/31/2016  . History of bunionectomy of both great toes 01/31/2016  . History of pelvic fracture 01/31/2016  . Trochanteric bursitis, right hip 01/26/2016  . Anxiety and depression 09/19/2015  . Fibromyalgia 09/19/2015  . Migraines 09/19/2015  .  Hypothyroidism 09/19/2015  . Insomnia 08/20/2015  . Hypersomnia 08/20/2015  . Depression 08/20/2015    Past Medical History:  Diagnosis Date  . Allergy   . Anemia   . Anxiety   . Fibromyalgia   . H/o Lyme disease   . H/O sickle cell trait     . History of pelvic fracture   . History of urinary incontinence   . Hypertension   . Insomnia   . Labral tear of hip joint    Right Hip  . Migraine   . Osteoarthritis   . RLS (restless legs syndrome)   . Thyroid disease   . TMJ syndrome   . Vitamin D deficiency     Family History  Problem Relation Age of Onset  . Hyperlipidemia Mother   . Diabetes Father   . Hypertension Father   . Cancer Father     prostate  . Diabetes Sister   . Hypertension Sister   . Arthritis Maternal Grandmother   . Stroke Maternal Grandmother   . Hypertension Maternal Grandmother   . Hyperlipidemia Maternal Grandmother   . Hyperkalemia Maternal Grandmother   . Heart disease Maternal Grandmother   . Arthritis Paternal Grandmother   . Diabetes Sister    Past Surgical History:  Procedure Laterality Date  . ABDOMINAL HYSTERECTOMY    . BUNIONECTOMY Bilateral   . chiari malformation    . ENDOMETRIAL ABLATION    . MYOMECTOMY    . OOPHORECTOMY    . TONSILLECTOMY AND ADENOIDECTOMY     Social History   Social History Narrative  . No narrative on file     Objective: Vital Signs: BP (!) 146/90 (BP Location: Left Arm, Patient Position: Sitting, Cuff Size: Small)   Pulse (!) 57   Resp 14   Ht 5\' 8"  (1.727 m)   Wt 186 lb (84.4 kg)   BMI 28.28 kg/m    Physical Exam  Constitutional: She is oriented to person, place, and time. She appears well-developed and well-nourished.  HENT:  Head: Normocephalic and atraumatic.  Eyes: Conjunctivae and EOM are normal.  Neck: Normal range of motion.  Cardiovascular: Normal rate, regular rhythm, normal heart sounds and intact distal pulses.   Pulmonary/Chest: Effort normal and breath sounds normal.  Abdominal: Soft. Bowel sounds are normal.  Lymphadenopathy:    She has no cervical adenopathy.  Neurological: She is alert and oriented to person, place, and time.  Skin: Skin is warm and dry. Capillary refill takes less than 2 seconds.  Psychiatric: She has  a normal mood and affect. Her behavior is normal.  Nursing note and vitals reviewed.    Musculoskeletal Exam: C-spine good range of motion, she had discomfort with range of motion of her thoracic and lumbar spine. Shoulder joints elbow joints wrist joint MCPs PIPs with good range of motion with no synovitis. She is painful limited range of motion of her right hip. The tenderness over trochanteric area bilaterally left hip joint was good range of motion with minimal discomfort she has good range of motion of her knee joints and ankle joints. Fibromyalgia tender points were about 16 out of 18 positive.  CDAI Exam: No CDAI exam completed.    Investigation: Findings:  08/03/15  X-ray of right hip joint 2 views today were within normal limits.  Her labs from 07/20/2015 showed BMP was normal.    Imaging: No results found.  Speciality Comments: No specialty comments available.    Procedures:  No procedures performed Allergies: Penicillins and  Lisinopril   Assessment / Plan: Visit Diagnoses: Fibromyalgia: She continues to have generalized pain and discomfort and positive tender points. She requests refill on Soma today which helps her with her muscle spasms.  Hypersomnia: It is better since she has been using CPAP at night.  Primary insomnia: Insomnia has improved on CPAP. His  Trochanteric bursitis, right hip: She continues to have some discomfort. I will refill Lidoderm patch today.  Essential hypertension her blood pressure today is a still elevated she is seen Dr. Jacinto HalimGanji. I've advised her to follow-up there. I've advised her to make a diary of her blood pressure at home and take the readings with her to Dr. Verl DickerGanji's office next visit.  Restless leg syndrome; ongoing discomfort  Sickle cell trait (HCC)  Gastroesophageal reflux disease without esophagitis: Better  History of bunionectomy of both great toes  History of pelvic fracture  Obstructive sleep apnea syndrome - on  CPAP:@U  diagnosis and has been helpful.  Pain of right hip joint - Labral tear, OA: She is [facility] appointment.  She's been getting FMLA for her fibromyalgia flares. We will continue to fill that. It is 3 days  is a month.    Orders: No orders of the defined types were placed in this encounter.  No orders of the defined types were placed in this encounter.   Face-to-face time spent with patient was 30 minutes. 50% of time was spent in counseling and coordination of care.  Follow-Up Instructions: No Follow-up on file.   Pollyann SavoyShaili Akram Kissick, MD

## 2016-01-30 ENCOUNTER — Encounter: Payer: Self-pay | Admitting: *Deleted

## 2016-01-30 DIAGNOSIS — M62830 Muscle spasm of back: Secondary | ICD-10-CM | POA: Diagnosis not present

## 2016-01-30 DIAGNOSIS — M5431 Sciatica, right side: Secondary | ICD-10-CM | POA: Diagnosis not present

## 2016-01-30 DIAGNOSIS — M9904 Segmental and somatic dysfunction of sacral region: Secondary | ICD-10-CM | POA: Diagnosis not present

## 2016-01-30 DIAGNOSIS — M5441 Lumbago with sciatica, right side: Secondary | ICD-10-CM | POA: Diagnosis not present

## 2016-01-31 DIAGNOSIS — M9904 Segmental and somatic dysfunction of sacral region: Secondary | ICD-10-CM | POA: Diagnosis not present

## 2016-01-31 DIAGNOSIS — G2581 Restless legs syndrome: Secondary | ICD-10-CM | POA: Insufficient documentation

## 2016-01-31 DIAGNOSIS — K219 Gastro-esophageal reflux disease without esophagitis: Secondary | ICD-10-CM | POA: Insufficient documentation

## 2016-01-31 DIAGNOSIS — M5441 Lumbago with sciatica, right side: Secondary | ICD-10-CM | POA: Diagnosis not present

## 2016-01-31 DIAGNOSIS — M62830 Muscle spasm of back: Secondary | ICD-10-CM | POA: Diagnosis not present

## 2016-01-31 DIAGNOSIS — D573 Sickle-cell trait: Secondary | ICD-10-CM | POA: Insufficient documentation

## 2016-01-31 DIAGNOSIS — Z9889 Other specified postprocedural states: Secondary | ICD-10-CM | POA: Insufficient documentation

## 2016-01-31 DIAGNOSIS — I10 Essential (primary) hypertension: Secondary | ICD-10-CM | POA: Insufficient documentation

## 2016-01-31 DIAGNOSIS — M5431 Sciatica, right side: Secondary | ICD-10-CM | POA: Diagnosis not present

## 2016-01-31 DIAGNOSIS — Z8781 Personal history of (healed) traumatic fracture: Secondary | ICD-10-CM | POA: Insufficient documentation

## 2016-02-01 ENCOUNTER — Encounter: Payer: Self-pay | Admitting: Rheumatology

## 2016-02-01 ENCOUNTER — Ambulatory Visit (INDEPENDENT_AMBULATORY_CARE_PROVIDER_SITE_OTHER): Payer: BLUE CROSS/BLUE SHIELD | Admitting: Rheumatology

## 2016-02-01 VITALS — BP 146/90 | HR 57 | Resp 14 | Ht 68.0 in | Wt 186.0 lb

## 2016-02-01 DIAGNOSIS — Z9889 Other specified postprocedural states: Secondary | ICD-10-CM | POA: Diagnosis not present

## 2016-02-01 DIAGNOSIS — F5101 Primary insomnia: Secondary | ICD-10-CM | POA: Diagnosis not present

## 2016-02-01 DIAGNOSIS — K219 Gastro-esophageal reflux disease without esophagitis: Secondary | ICD-10-CM | POA: Diagnosis not present

## 2016-02-01 DIAGNOSIS — G2581 Restless legs syndrome: Secondary | ICD-10-CM | POA: Diagnosis not present

## 2016-02-01 DIAGNOSIS — G4710 Hypersomnia, unspecified: Secondary | ICD-10-CM

## 2016-02-01 DIAGNOSIS — M7061 Trochanteric bursitis, right hip: Secondary | ICD-10-CM | POA: Diagnosis not present

## 2016-02-01 DIAGNOSIS — I10 Essential (primary) hypertension: Secondary | ICD-10-CM

## 2016-02-01 DIAGNOSIS — G4733 Obstructive sleep apnea (adult) (pediatric): Secondary | ICD-10-CM | POA: Insufficient documentation

## 2016-02-01 DIAGNOSIS — Z8781 Personal history of (healed) traumatic fracture: Secondary | ICD-10-CM

## 2016-02-01 DIAGNOSIS — M797 Fibromyalgia: Secondary | ICD-10-CM | POA: Diagnosis not present

## 2016-02-01 DIAGNOSIS — D573 Sickle-cell trait: Secondary | ICD-10-CM

## 2016-02-01 DIAGNOSIS — M25551 Pain in right hip: Secondary | ICD-10-CM | POA: Diagnosis not present

## 2016-02-01 DIAGNOSIS — I70261 Atherosclerosis of native arteries of extremities with gangrene, right leg: Secondary | ICD-10-CM | POA: Insufficient documentation

## 2016-02-01 MED ORDER — CARISOPRODOL 350 MG PO TABS: 350.0000 mg | ORAL_TABLET | Freq: Every day | ORAL | 4 refills | Status: DC

## 2016-02-01 MED ORDER — LIDOCAINE 5 % EX PTCH: 1.0000 | MEDICATED_PATCH | CUTANEOUS | 1 refills | Status: DC

## 2016-02-07 ENCOUNTER — Encounter: Payer: Self-pay | Admitting: Pulmonary Disease

## 2016-02-08 DIAGNOSIS — G4733 Obstructive sleep apnea (adult) (pediatric): Secondary | ICD-10-CM | POA: Diagnosis not present

## 2016-02-13 ENCOUNTER — Telehealth (INDEPENDENT_AMBULATORY_CARE_PROVIDER_SITE_OTHER): Payer: Self-pay | Admitting: Orthopaedic Surgery

## 2016-02-13 DIAGNOSIS — M25551 Pain in right hip: Secondary | ICD-10-CM | POA: Diagnosis not present

## 2016-02-13 DIAGNOSIS — M1611 Unilateral primary osteoarthritis, right hip: Secondary | ICD-10-CM | POA: Diagnosis not present

## 2016-02-13 NOTE — Telephone Encounter (Signed)
Pt states she saw [facility] today, per a referral from Dr Magnus IvanBlackman & was told that she needed a  hip replacement. Pt wanted to discuss surgery with Dr Magnus IvanBlackman for the week of December 20th.

## 2016-02-14 NOTE — Telephone Encounter (Signed)
See below

## 2016-02-14 NOTE — Telephone Encounter (Signed)
LMOM for patient the message below from Dr. Magnus IvanBlackman

## 2016-02-14 NOTE — Telephone Encounter (Signed)
I spoke with Abigail Gomez and my only opening for a total hip before the end of the year on such late notice would be Dec 29th.  See if she wants this, and if she does, then [facility] that [name] so she can reserve that time for this patient.  I would then need to see the patient face-to-face next week to go over the details. Thanks!!

## 2016-02-15 DIAGNOSIS — G4733 Obstructive sleep apnea (adult) (pediatric): Secondary | ICD-10-CM | POA: Diagnosis not present

## 2016-02-20 ENCOUNTER — Ambulatory Visit (INDEPENDENT_AMBULATORY_CARE_PROVIDER_SITE_OTHER): Payer: BLUE CROSS/BLUE SHIELD | Admitting: Orthopaedic Surgery

## 2016-02-20 DIAGNOSIS — M25551 Pain in right hip: Secondary | ICD-10-CM | POA: Diagnosis not present

## 2016-02-20 NOTE — Progress Notes (Signed)
The patient is here today to go over hip replacement surgery that she is undergoing next week. This is to disease of the superior aspect of her acetabulum and the femoral head. I send her to [place] [facility] who felt that her best surgery would be for a hip replacement and not a hip arthroscopy. Her pain is daily now. It is 10 out of 10. She with a cane or walker sometimes on occasion but not today. He wakes her up at night and she's tried and failed all forms conservative treatment including intra-articular injection.  Today's visit I showed her hip model and explained in detail what hip replacement surgery involves including a thorough discussion of the risks and benefits of surgery. We have her set up for the surgery next week. On examination of her hip on the right side she has pain with flexion-extension internal and rotation rotation. Her leg lengths are equal.  We will see her in surgery next week and then see her back in 2 weeks postoperative but no x-rays are needed.

## 2016-02-21 ENCOUNTER — Other Ambulatory Visit (INDEPENDENT_AMBULATORY_CARE_PROVIDER_SITE_OTHER): Payer: Self-pay | Admitting: Physician Assistant

## 2016-02-22 ENCOUNTER — Ambulatory Visit (INDEPENDENT_AMBULATORY_CARE_PROVIDER_SITE_OTHER): Payer: BLUE CROSS/BLUE SHIELD | Admitting: Orthopaedic Surgery

## 2016-02-23 DIAGNOSIS — M545 Low back pain: Secondary | ICD-10-CM | POA: Diagnosis not present

## 2016-02-24 NOTE — Pre-Procedure Instructions (Signed)
    Abigail Gomez  02/24/2016      [id number] - [place], [place] - [address] [facility] [address] [place] [id number] Phone: [phone] Fax: [phone]    Your procedure is scheduled on 02/28/16.  [facility] Admitting at [phone] A.M.  Call this number if you have problems the morning of surgery:  [phone]   Remember:  Do not eat food or drink liquids after midnight.  Take these medicines the morning of surgery with A SIP OF WATER --vivella dot,inderal,armour   Do not wear jewelry, make-up or nail polish.  Do not wear lotions, powders, or perfumes, or deoderant.  Do not shave 48 hours prior to surgery.  Men may shave face and neck.  Do not bring valuables to the hospital.  [facility] is not responsible for any belongings or valuables.  Contacts, dentures or bridgework may not be worn into surgery.  Leave your suitcase in the car.  After surgery it may be brought to your room.  For patients admitted to the hospital, discharge time will be determined by your treatment team.  Patients discharged the day of surgery will not be allowed to drive home.   Name and phone number of your driver:    Special instructions:  Do not take any aspirin,anti-inflammatories,vitamins,or herbal supplements 5-7 days prior to surgery.  Please read over the following fact sheets that you were given. MRSA Information

## 2016-02-27 ENCOUNTER — Encounter (HOSPITAL_COMMUNITY)
Admission: RE | Admit: 2016-02-27 | Discharge: 2016-02-27 | Disposition: A | Discharge status: Home / Self Care | Payer: BLUE CROSS/BLUE SHIELD | Source: Ambulatory Visit | Attending: Orthopaedic Surgery | Admitting: Orthopaedic Surgery

## 2016-02-27 ENCOUNTER — Encounter (HOSPITAL_COMMUNITY): Payer: Self-pay

## 2016-02-27 DIAGNOSIS — Z01818 Encounter for other preprocedural examination: Secondary | ICD-10-CM

## 2016-02-27 DIAGNOSIS — Z888 Allergy status to other drugs, medicaments and biological substances status: Secondary | ICD-10-CM | POA: Diagnosis not present

## 2016-02-27 DIAGNOSIS — M1611 Unilateral primary osteoarthritis, right hip: Secondary | ICD-10-CM

## 2016-02-27 DIAGNOSIS — Z79899 Other long term (current) drug therapy: Secondary | ICD-10-CM | POA: Diagnosis not present

## 2016-02-27 DIAGNOSIS — K219 Gastro-esophageal reflux disease without esophagitis: Secondary | ICD-10-CM | POA: Diagnosis not present

## 2016-02-27 DIAGNOSIS — Z96641 Presence of right artificial hip joint: Secondary | ICD-10-CM | POA: Diagnosis not present

## 2016-02-27 DIAGNOSIS — F419 Anxiety disorder, unspecified: Secondary | ICD-10-CM | POA: Diagnosis not present

## 2016-02-27 DIAGNOSIS — I10 Essential (primary) hypertension: Secondary | ICD-10-CM | POA: Diagnosis not present

## 2016-02-27 DIAGNOSIS — M25551 Pain in right hip: Secondary | ICD-10-CM | POA: Diagnosis not present

## 2016-02-27 DIAGNOSIS — Z1231 Encounter for screening mammogram for malignant neoplasm of breast: Secondary | ICD-10-CM | POA: Diagnosis not present

## 2016-02-27 DIAGNOSIS — Z6828 Body mass index (BMI) 28.0-28.9, adult: Secondary | ICD-10-CM | POA: Diagnosis not present

## 2016-02-27 DIAGNOSIS — M797 Fibromyalgia: Secondary | ICD-10-CM | POA: Diagnosis not present

## 2016-02-27 DIAGNOSIS — Z13220 Encounter for screening for lipoid disorders: Secondary | ICD-10-CM | POA: Diagnosis not present

## 2016-02-27 DIAGNOSIS — E559 Vitamin D deficiency, unspecified: Secondary | ICD-10-CM | POA: Diagnosis not present

## 2016-02-27 DIAGNOSIS — Z131 Encounter for screening for diabetes mellitus: Secondary | ICD-10-CM | POA: Diagnosis not present

## 2016-02-27 DIAGNOSIS — E785 Hyperlipidemia, unspecified: Secondary | ICD-10-CM | POA: Diagnosis not present

## 2016-02-27 DIAGNOSIS — R269 Unspecified abnormalities of gait and mobility: Secondary | ICD-10-CM | POA: Diagnosis not present

## 2016-02-27 DIAGNOSIS — Z880 Allergy status to penicillin: Secondary | ICD-10-CM | POA: Diagnosis not present

## 2016-02-27 DIAGNOSIS — F329 Major depressive disorder, single episode, unspecified: Secondary | ICD-10-CM | POA: Diagnosis not present

## 2016-02-27 DIAGNOSIS — Z471 Aftercare following joint replacement surgery: Secondary | ICD-10-CM | POA: Diagnosis not present

## 2016-02-27 DIAGNOSIS — G2581 Restless legs syndrome: Secondary | ICD-10-CM | POA: Diagnosis not present

## 2016-02-27 DIAGNOSIS — E039 Hypothyroidism, unspecified: Secondary | ICD-10-CM | POA: Diagnosis not present

## 2016-02-27 DIAGNOSIS — G4733 Obstructive sleep apnea (adult) (pediatric): Secondary | ICD-10-CM | POA: Diagnosis not present

## 2016-02-27 DIAGNOSIS — Z130 Encounter for screening for diseases of the blood and blood-forming organs and certain disorders involving the immune mechanism: Secondary | ICD-10-CM | POA: Diagnosis not present

## 2016-02-27 DIAGNOSIS — Z885 Allergy status to narcotic agent status: Secondary | ICD-10-CM | POA: Diagnosis not present

## 2016-02-27 DIAGNOSIS — Z01419 Encounter for gynecological examination (general) (routine) without abnormal findings: Secondary | ICD-10-CM | POA: Diagnosis not present

## 2016-02-27 DIAGNOSIS — M65851 Other synovitis and tenosynovitis, right thigh: Secondary | ICD-10-CM | POA: Diagnosis not present

## 2016-02-27 DIAGNOSIS — Z13228 Encounter for screening for other metabolic disorders: Secondary | ICD-10-CM | POA: Diagnosis not present

## 2016-02-27 DIAGNOSIS — D573 Sickle-cell trait: Secondary | ICD-10-CM | POA: Diagnosis not present

## 2016-02-27 DIAGNOSIS — Z91018 Allergy to other foods: Secondary | ICD-10-CM | POA: Diagnosis not present

## 2016-02-27 HISTORY — DX: Sleep apnea, unspecified: G47.30

## 2016-02-27 HISTORY — DX: Hypothyroidism, unspecified: E03.9

## 2016-02-27 HISTORY — DX: Major depressive disorder, single episode, unspecified: F32.9

## 2016-02-27 HISTORY — DX: Illness, unspecified: R69

## 2016-02-27 HISTORY — DX: Depression, unspecified: F32.A

## 2016-02-27 LAB — CBC
HCT: 37 % (ref 36.0–46.0)
Hemoglobin: 12.7 g/dL (ref 12.0–15.0)
MCH: 27.9 pg (ref 26.0–34.0)
MCHC: 34.3 g/dL (ref 30.0–36.0)
MCV: 81.3 fL (ref 78.0–100.0)
PLATELETS: 161 10*3/uL (ref 150–400)
RBC: 4.55 MIL/uL (ref 3.87–5.11)
RDW: 13.3 % (ref 11.5–15.5)
WBC: 5.1 10*3/uL (ref 4.0–10.5)

## 2016-02-27 LAB — BASIC METABOLIC PANEL
ANION GAP: 5 (ref 5–15)
BUN: 10 mg/dL (ref 6–20)
CO2: 31 mmol/L (ref 22–32)
Calcium: 9.5 mg/dL (ref 8.9–10.3)
Chloride: 103 mmol/L (ref 101–111)
Creatinine, Ser: 0.82 mg/dL (ref 0.44–1.00)
GFR calc Af Amer: >60 mL/min (ref >60)
Glucose, Bld: 99 mg/dL (ref 65–99)
POTASSIUM: 4.3 mmol/L (ref 3.5–5.1)
SODIUM: 139 mmol/L (ref 135–145)

## 2016-02-27 LAB — SURGICAL PCR SCREEN
MRSA, PCR: NEGATIVE
Staphylococcus aureus: NEGATIVE

## 2016-02-27 MED ORDER — TRANEXAMIC ACID 1000 MG/10ML IV SOLN
1000.0000 mg | INTRAVENOUS | Status: AC
  Administered 2016-02-28: 1000 mg via INTRAVENOUS
  Filled 2016-02-27: qty 10

## 2016-02-28 ENCOUNTER — Inpatient Hospital Stay (HOSPITAL_COMMUNITY): Payer: BLUE CROSS/BLUE SHIELD

## 2016-02-28 ENCOUNTER — Ambulatory Visit (INDEPENDENT_AMBULATORY_CARE_PROVIDER_SITE_OTHER): Payer: BLUE CROSS/BLUE SHIELD | Admitting: Family Medicine

## 2016-02-28 ENCOUNTER — Encounter: Payer: Self-pay | Admitting: Family Medicine

## 2016-02-28 ENCOUNTER — Inpatient Hospital Stay (HOSPITAL_COMMUNITY): Payer: BLUE CROSS/BLUE SHIELD | Admitting: Anesthesiology

## 2016-02-28 ENCOUNTER — Encounter (HOSPITAL_COMMUNITY): Payer: Self-pay | Admitting: Certified Registered"

## 2016-02-28 ENCOUNTER — Inpatient Hospital Stay (HOSPITAL_COMMUNITY)
Admission: RE | Admit: 2016-02-28 | Discharge: 2016-03-02 | DRG: 470 | Disposition: A | Discharge status: Home Health | Payer: BLUE CROSS/BLUE SHIELD | Source: Ambulatory Visit | Attending: Orthopaedic Surgery | Admitting: Orthopaedic Surgery

## 2016-02-28 ENCOUNTER — Encounter (HOSPITAL_COMMUNITY)
Admission: RE | Disposition: A | Discharge status: Home Health | Payer: Self-pay | Source: Ambulatory Visit | Attending: Orthopaedic Surgery

## 2016-02-28 VITALS — BP 140/89 | HR 55 | Temp 98.1°F | Resp 17 | Ht 68.0 in | Wt 182.4 lb

## 2016-02-28 DIAGNOSIS — M797 Fibromyalgia: Secondary | ICD-10-CM | POA: Diagnosis not present

## 2016-02-28 DIAGNOSIS — Z79899 Other long term (current) drug therapy: Secondary | ICD-10-CM

## 2016-02-28 DIAGNOSIS — I10 Essential (primary) hypertension: Secondary | ICD-10-CM

## 2016-02-28 DIAGNOSIS — F329 Major depressive disorder, single episode, unspecified: Secondary | ICD-10-CM | POA: Diagnosis present

## 2016-02-28 DIAGNOSIS — E785 Hyperlipidemia, unspecified: Secondary | ICD-10-CM | POA: Insufficient documentation

## 2016-02-28 DIAGNOSIS — Z96641 Presence of right artificial hip joint: Secondary | ICD-10-CM | POA: Diagnosis not present

## 2016-02-28 DIAGNOSIS — Z471 Aftercare following joint replacement surgery: Secondary | ICD-10-CM | POA: Diagnosis not present

## 2016-02-28 DIAGNOSIS — M1611 Unilateral primary osteoarthritis, right hip: Principal | ICD-10-CM | POA: Diagnosis present

## 2016-02-28 DIAGNOSIS — F419 Anxiety disorder, unspecified: Secondary | ICD-10-CM | POA: Diagnosis present

## 2016-02-28 DIAGNOSIS — E039 Hypothyroidism, unspecified: Secondary | ICD-10-CM | POA: Diagnosis present

## 2016-02-28 DIAGNOSIS — Z885 Allergy status to narcotic agent status: Secondary | ICD-10-CM | POA: Diagnosis not present

## 2016-02-28 DIAGNOSIS — G2581 Restless legs syndrome: Secondary | ICD-10-CM | POA: Diagnosis present

## 2016-02-28 DIAGNOSIS — Z888 Allergy status to other drugs, medicaments and biological substances status: Secondary | ICD-10-CM | POA: Diagnosis not present

## 2016-02-28 DIAGNOSIS — M65851 Other synovitis and tenosynovitis, right thigh: Secondary | ICD-10-CM | POA: Diagnosis present

## 2016-02-28 DIAGNOSIS — Z880 Allergy status to penicillin: Secondary | ICD-10-CM

## 2016-02-28 DIAGNOSIS — D573 Sickle-cell trait: Secondary | ICD-10-CM | POA: Diagnosis present

## 2016-02-28 DIAGNOSIS — Z419 Encounter for procedure for purposes other than remedying health state, unspecified: Secondary | ICD-10-CM

## 2016-02-28 DIAGNOSIS — E559 Vitamin D deficiency, unspecified: Secondary | ICD-10-CM | POA: Diagnosis present

## 2016-02-28 DIAGNOSIS — M25551 Pain in right hip: Secondary | ICD-10-CM | POA: Diagnosis not present

## 2016-02-28 DIAGNOSIS — R269 Unspecified abnormalities of gait and mobility: Secondary | ICD-10-CM | POA: Diagnosis not present

## 2016-02-28 DIAGNOSIS — K219 Gastro-esophageal reflux disease without esophagitis: Secondary | ICD-10-CM | POA: Diagnosis present

## 2016-02-28 DIAGNOSIS — G4733 Obstructive sleep apnea (adult) (pediatric): Secondary | ICD-10-CM | POA: Diagnosis not present

## 2016-02-28 DIAGNOSIS — Z91018 Allergy to other foods: Secondary | ICD-10-CM | POA: Diagnosis not present

## 2016-02-28 HISTORY — PX: TOTAL HIP ARTHROPLASTY: SHX124

## 2016-02-28 LAB — TSH: TSH: 2.28 u[IU]/mL (ref 0.35–4.50)

## 2016-02-28 LAB — LIPID PANEL
CHOL/HDL RATIO: 3
Cholesterol: 183 mg/dL (ref 0–200)
HDL: 60.3 mg/dL (ref >39.00)
LDL CALC: 106 mg/dL — AB (ref 0–99)
NONHDL: 122.46
Triglycerides: 83 mg/dL (ref 0.0–149.0)
VLDL: 16.6 mg/dL (ref 0.0–40.0)

## 2016-02-28 LAB — HEPATIC FUNCTION PANEL
ALBUMIN: 4.1 g/dL (ref 3.5–5.2)
ALT: 28 U/L (ref 0–35)
AST: 22 U/L (ref 0–37)
Alkaline Phosphatase: 74 U/L (ref 39–117)
BILIRUBIN TOTAL: 0.6 mg/dL (ref 0.2–1.2)
Bilirubin, Direct: 0.1 mg/dL (ref 0.0–0.3)
Total Protein: 6.3 g/dL (ref 6.0–8.3)

## 2016-02-28 LAB — SEDIMENTATION RATE: SED RATE: 10 mm/h (ref 0–20)

## 2016-02-28 SURGERY — ARTHROPLASTY, HIP, TOTAL, ANTERIOR APPROACH
Anesthesia: Spinal | Laterality: Right

## 2016-02-28 MED ORDER — PROPOFOL 500 MG/50ML IV EMUL
INTRAVENOUS | Status: DC | PRN
  Administered 2016-02-28: 75 ug/kg/min via INTRAVENOUS

## 2016-02-28 MED ORDER — PROGESTERONE MICRONIZED 100 MG PO CAPS
100.0000 mg | ORAL_CAPSULE | Freq: Every day | ORAL | Status: DC
  Administered 2016-02-28 – 2016-03-01 (×3): 100 mg via ORAL
  Filled 2016-02-28 (×3): qty 1

## 2016-02-28 MED ORDER — ASPIRIN 81 MG PO CHEW
81.0000 mg | CHEWABLE_TABLET | Freq: Two times a day (BID) | ORAL | Status: DC
  Administered 2016-02-28 – 2016-03-02 (×6): 81 mg via ORAL
  Filled 2016-02-28 (×6): qty 1

## 2016-02-28 MED ORDER — SODIUM CHLORIDE 0.9 % IV SOLN
INTRAVENOUS | Status: DC
Start: 2016-02-28 — End: 2016-03-02
  Administered 2016-02-28: 18:00:00 via INTRAVENOUS

## 2016-02-28 MED ORDER — SUMATRIPTAN SUCCINATE 100 MG PO TABS
100.0000 mg | ORAL_TABLET | Freq: Every day | ORAL | Status: DC | PRN
  Filled 2016-02-28: qty 1

## 2016-02-28 MED ORDER — GLYCOPYRROLATE 0.2 MG/ML IV SOSY
PREFILLED_SYRINGE | INTRAVENOUS | Status: DC | PRN
  Administered 2016-02-28: 0.4 mg via INTRAVENOUS

## 2016-02-28 MED ORDER — MENTHOL 3 MG MT LOZG: 1.0000 | LOZENGE | OROMUCOSAL | Status: DC | PRN

## 2016-02-28 MED ORDER — DOCUSATE SODIUM 100 MG PO CAPS
100.0000 mg | ORAL_CAPSULE | Freq: Two times a day (BID) | ORAL | Status: DC
  Administered 2016-02-28 – 2016-03-02 (×6): 100 mg via ORAL
  Filled 2016-02-28 (×6): qty 1

## 2016-02-28 MED ORDER — ACETAMINOPHEN 650 MG RE SUPP: 650.0000 mg | Freq: Four times a day (QID) | RECTAL | Status: DC | PRN

## 2016-02-28 MED ORDER — LIDOCAINE 2% (20 MG/ML) 5 ML SYRINGE
INTRAMUSCULAR | Status: DC | PRN
  Administered 2016-02-28: 40 mg via INTRAVENOUS

## 2016-02-28 MED ORDER — ACETAMINOPHEN 325 MG PO TABS
650.0000 mg | ORAL_TABLET | Freq: Four times a day (QID) | ORAL | Status: DC | PRN
  Filled 2016-02-28: qty 2

## 2016-02-28 MED ORDER — HYDROMORPHONE HCL 2 MG/ML IJ SOLN
1.0000 mg | INTRAMUSCULAR | Status: DC | PRN
  Administered 2016-02-28 – 2016-03-02 (×4): 1 mg via INTRAVENOUS
  Filled 2016-02-28 (×4): qty 1

## 2016-02-28 MED ORDER — RIZATRIPTAN BENZOATE 10 MG PO TBDP: 10.0000 mg | ORAL_TABLET | Freq: Every day | ORAL | Status: DC | PRN

## 2016-02-28 MED ORDER — METHOCARBAMOL 500 MG PO TABS
500.0000 mg | ORAL_TABLET | Freq: Four times a day (QID) | ORAL | Status: DC | PRN
  Administered 2016-03-01 – 2016-03-02 (×3): 500 mg via ORAL
  Filled 2016-02-28 (×3): qty 1

## 2016-02-28 MED ORDER — OXYCODONE HCL 5 MG PO TABS
5.0000 mg | ORAL_TABLET | ORAL | Status: DC | PRN
  Administered 2016-02-28: 5 mg via ORAL
  Administered 2016-02-29 – 2016-03-02 (×13): 10 mg via ORAL
  Filled 2016-02-28: qty 1
  Filled 2016-02-28 (×14): qty 2

## 2016-02-28 MED ORDER — PHENOL 1.4 % MT LIQD: 1.0000 | OROMUCOSAL | Status: DC | PRN

## 2016-02-28 MED ORDER — KETOROLAC TROMETHAMINE 15 MG/ML IJ SOLN
7.5000 mg | Freq: Four times a day (QID) | INTRAMUSCULAR | Status: AC
  Administered 2016-02-28 – 2016-02-29 (×3): 7.5 mg via INTRAVENOUS
  Filled 2016-02-28 (×3): qty 1

## 2016-02-28 MED ORDER — HYDROCHLOROTHIAZIDE 25 MG PO TABS
12.5000 mg | ORAL_TABLET | Freq: Every day | ORAL | Status: DC
  Administered 2016-02-29 – 2016-03-02 (×3): 12.5 mg via ORAL
  Filled 2016-02-28 (×3): qty 1

## 2016-02-28 MED ORDER — PHENYLEPHRINE HCL 10 MG/ML IJ SOLN
INTRAVENOUS | Status: DC | PRN
  Administered 2016-02-28: 20 ug/min via INTRAVENOUS
  Administered 2016-02-28: 25 ug/min via INTRAVENOUS

## 2016-02-28 MED ORDER — MIDAZOLAM HCL 5 MG/5ML IJ SOLN
INTRAMUSCULAR | Status: DC | PRN
  Administered 2016-02-28: 2 mg via INTRAVENOUS

## 2016-02-28 MED ORDER — CLINDAMYCIN PHOSPHATE 900 MG/50ML IV SOLN
900.0000 mg | INTRAVENOUS | Status: AC
  Administered 2016-02-28: 900 mg via INTRAVENOUS
  Filled 2016-02-28: qty 50

## 2016-02-28 MED ORDER — VITAMIN D 1000 UNITS PO TABS
1000.0000 [IU] | ORAL_TABLET | Freq: Every day | ORAL | Status: DC
  Administered 2016-02-29 – 2016-03-02 (×3): 1000 [IU] via ORAL
  Filled 2016-02-28 (×6): qty 1

## 2016-02-28 MED ORDER — MIDAZOLAM HCL 2 MG/2ML IJ SOLN
INTRAMUSCULAR | Status: AC
  Filled 2016-02-28: qty 2

## 2016-02-28 MED ORDER — ONDANSETRON HCL 4 MG PO TABS: 4.0000 mg | ORAL_TABLET | Freq: Four times a day (QID) | ORAL | Status: DC | PRN

## 2016-02-28 MED ORDER — PROMETHAZINE HCL 25 MG/ML IJ SOLN: 6.2500 mg | INTRAMUSCULAR | Status: DC | PRN

## 2016-02-28 MED ORDER — EPHEDRINE SULFATE-NACL 50-0.9 MG/10ML-% IV SOSY
PREFILLED_SYRINGE | INTRAVENOUS | Status: DC | PRN
  Administered 2016-02-28 (×4): 5 mg via INTRAVENOUS

## 2016-02-28 MED ORDER — LIDOCAINE 2% (20 MG/ML) 5 ML SYRINGE
INTRAMUSCULAR | Status: AC
  Filled 2016-02-28: qty 5

## 2016-02-28 MED ORDER — HYDROXYZINE HCL 50 MG PO TABS
50.0000 mg | ORAL_TABLET | Freq: Every day | ORAL | Status: DC
  Administered 2016-02-29 – 2016-03-01 (×2): 50 mg via ORAL
  Filled 2016-02-28 (×3): qty 1

## 2016-02-28 MED ORDER — METHOCARBAMOL 1000 MG/10ML IJ SOLN
500.0000 mg | Freq: Four times a day (QID) | INTRAMUSCULAR | Status: DC | PRN
  Filled 2016-02-28: qty 5

## 2016-02-28 MED ORDER — FENTANYL CITRATE (PF) 100 MCG/2ML IJ SOLN
INTRAMUSCULAR | Status: DC | PRN
  Administered 2016-02-28: 100 ug via INTRAVENOUS

## 2016-02-28 MED ORDER — HYDROCHLOROTHIAZIDE 12.5 MG PO TABS: 12.5000 mg | ORAL_TABLET | Freq: Every day | ORAL | 3 refills | Status: DC

## 2016-02-28 MED ORDER — ZONISAMIDE 25 MG PO CAPS
150.0000 mg | ORAL_CAPSULE | Freq: Every day | ORAL | Status: DC
  Administered 2016-03-01: 150 mg via ORAL
  Filled 2016-02-28 (×3): qty 6

## 2016-02-28 MED ORDER — FENTANYL CITRATE (PF) 100 MCG/2ML IJ SOLN
INTRAMUSCULAR | Status: AC
  Filled 2016-02-28: qty 2

## 2016-02-28 MED ORDER — METOCLOPRAMIDE HCL 5 MG/ML IJ SOLN: 5.0000 mg | Freq: Three times a day (TID) | INTRAMUSCULAR | Status: DC | PRN

## 2016-02-28 MED ORDER — METOCLOPRAMIDE HCL 5 MG PO TABS: 5.0000 mg | ORAL_TABLET | Freq: Three times a day (TID) | ORAL | Status: DC | PRN

## 2016-02-28 MED ORDER — ONDANSETRON HCL 4 MG/2ML IJ SOLN
4.0000 mg | Freq: Four times a day (QID) | INTRAMUSCULAR | Status: DC | PRN
  Administered 2016-02-29: 4 mg via INTRAVENOUS
  Filled 2016-02-28: qty 2

## 2016-02-28 MED ORDER — MAGNESIUM AMINO ACID CHELATE 20 % POWD: 450.0000 mg | Freq: Every day | Status: DC

## 2016-02-28 MED ORDER — LACTATED RINGERS IV SOLN
INTRAVENOUS | Status: DC
Start: 2016-02-28 — End: 2016-02-28
  Administered 2016-02-28 (×2): via INTRAVENOUS

## 2016-02-28 MED ORDER — VORTIOXETINE HBR 20 MG PO TABS
20.0000 mg | ORAL_TABLET | Freq: Every day | ORAL | Status: DC
  Administered 2016-02-29 – 2016-03-02 (×3): 20 mg via ORAL
  Filled 2016-02-28 (×3): qty 20

## 2016-02-28 MED ORDER — ALUM & MAG HYDROXIDE-SIMETH 200-200-20 MG/5ML PO SUSP: 30.0000 mL | ORAL | Status: DC | PRN

## 2016-02-28 MED ORDER — HYDROMORPHONE HCL 1 MG/ML IJ SOLN
0.2500 mg | INTRAMUSCULAR | Status: DC | PRN
  Administered 2016-02-28 (×4): 0.5 mg via INTRAVENOUS

## 2016-02-28 MED ORDER — CLINDAMYCIN PHOSPHATE 600 MG/50ML IV SOLN
600.0000 mg | Freq: Four times a day (QID) | INTRAVENOUS | Status: AC
  Administered 2016-02-28: 600 mg via INTRAVENOUS
  Filled 2016-02-28 (×3): qty 50

## 2016-02-28 MED ORDER — HYDROMORPHONE HCL 1 MG/ML IJ SOLN: 1.0000 mg | INTRAMUSCULAR | Status: DC | PRN

## 2016-02-28 MED ORDER — DIPHENHYDRAMINE HCL 12.5 MG/5ML PO ELIX
12.5000 mg | ORAL_SOLUTION | ORAL | Status: DC | PRN
  Administered 2016-02-28 – 2016-02-29 (×2): 25 mg via ORAL
  Filled 2016-02-28 (×2): qty 10

## 2016-02-28 MED ORDER — HYDROMORPHONE HCL 1 MG/ML IJ SOLN
INTRAMUSCULAR | Status: AC
  Filled 2016-02-28: qty 0.5

## 2016-02-28 MED ORDER — BUPIVACAINE IN DEXTROSE 0.75-8.25 % IT SOLN
INTRATHECAL | Status: DC | PRN
  Administered 2016-02-28: 15 mg via INTRATHECAL

## 2016-02-28 MED ORDER — PROPRANOLOL HCL ER 80 MG PO CP24
80.0000 mg | ORAL_CAPSULE | Freq: Every day | ORAL | Status: DC
  Administered 2016-02-29 – 2016-03-02 (×3): 80 mg via ORAL
  Filled 2016-02-28 (×3): qty 1

## 2016-02-28 MED ORDER — SODIUM CHLORIDE 0.9 % IR SOLN
Status: DC | PRN
  Administered 2016-02-28: 3000 mL

## 2016-02-28 MED ORDER — MELATONIN 3 MG PO TABS
9.0000 mg | ORAL_TABLET | Freq: Every day | ORAL | Status: DC
  Administered 2016-02-29 – 2016-03-01 (×2): 9 mg via ORAL
  Filled 2016-02-28 (×3): qty 3

## 2016-02-28 MED ORDER — CHLORHEXIDINE GLUCONATE 4 % EX LIQD: 60.0000 mL | Freq: Once | CUTANEOUS | Status: DC

## 2016-02-28 MED ORDER — 0.9 % SODIUM CHLORIDE (POUR BTL) OPTIME
TOPICAL | Status: DC | PRN
  Administered 2016-02-28: 1000 mL

## 2016-02-28 SURGICAL SUPPLY — 50 items
BENZOIN TINCTURE PRP APPL 2/3 (GAUZE/BANDAGES/DRESSINGS) ×2 IMPLANT
BLADE SAW SGTL 18X1.27X75 (BLADE) ×2 IMPLANT
BLADE SURG ROTATE 9660 (MISCELLANEOUS) IMPLANT
CAPT HIP TOTAL 2 ×2 IMPLANT
CELLS DAT CNTRL 66122 CELL SVR (MISCELLANEOUS) ×1 IMPLANT
CLSR STERI-STRIP ANTIMIC 1/2X4 (GAUZE/BANDAGES/DRESSINGS) ×2 IMPLANT
COVER SURGICAL LIGHT HANDLE (MISCELLANEOUS) ×2 IMPLANT
DRAPE C-ARM 42X72 X-RAY (DRAPES) ×2 IMPLANT
DRAPE STERI IOBAN 125X83 (DRAPES) ×2 IMPLANT
DRAPE U-SHAPE 47X51 STRL (DRAPES) ×6 IMPLANT
DRSG AQUACEL AG ADV 3.5X10 (GAUZE/BANDAGES/DRESSINGS) ×2 IMPLANT
DURAPREP 26ML APPLICATOR (WOUND CARE) ×2 IMPLANT
ELECT BLADE 4.0 EZ CLEAN MEGAD (MISCELLANEOUS) ×2
ELECT BLADE 6.5 EXT (BLADE) IMPLANT
ELECT REM PT RETURN 9FT ADLT (ELECTROSURGICAL) ×2
ELECTRODE BLDE 4.0 EZ CLN MEGD (MISCELLANEOUS) ×1 IMPLANT
ELECTRODE REM PT RTRN 9FT ADLT (ELECTROSURGICAL) ×1 IMPLANT
FACESHIELD WRAPAROUND (MASK) ×4 IMPLANT
GLOVE BIOGEL PI IND STRL 8 (GLOVE) ×2 IMPLANT
GLOVE BIOGEL PI INDICATOR 8 (GLOVE) ×2
GLOVE ECLIPSE 8.0 STRL XLNG CF (GLOVE) ×2 IMPLANT
GLOVE ORTHO TXT STRL SZ7.5 (GLOVE) ×4 IMPLANT
GOWN STRL REUS W/ TWL LRG LVL3 (GOWN DISPOSABLE) ×2 IMPLANT
GOWN STRL REUS W/ TWL XL LVL3 (GOWN DISPOSABLE) ×2 IMPLANT
GOWN STRL REUS W/TWL LRG LVL3 (GOWN DISPOSABLE) ×2
GOWN STRL REUS W/TWL XL LVL3 (GOWN DISPOSABLE) ×2
HANDPIECE INTERPULSE COAX TIP (DISPOSABLE) ×1
KIT BASIN OR (CUSTOM PROCEDURE TRAY) ×2 IMPLANT
KIT ROOM TURNOVER OR (KITS) ×2 IMPLANT
MANIFOLD NEPTUNE II (INSTRUMENTS) ×2 IMPLANT
NS IRRIG 1000ML POUR BTL (IV SOLUTION) ×2 IMPLANT
PACK TOTAL JOINT (CUSTOM PROCEDURE TRAY) ×2 IMPLANT
PAD ARMBOARD 7.5X6 YLW CONV (MISCELLANEOUS) ×2 IMPLANT
RTRCTR WOUND ALEXIS 18CM MED (MISCELLANEOUS) ×2
SET HNDPC FAN SPRY TIP SCT (DISPOSABLE) ×1 IMPLANT
STAPLER VISISTAT 35W (STAPLE) IMPLANT
STRIP CLOSURE SKIN 1/2X4 (GAUZE/BANDAGES/DRESSINGS) ×4 IMPLANT
SUT ETHIBOND NAB CT1 #1 30IN (SUTURE) ×2 IMPLANT
SUT MNCRL AB 4-0 PS2 18 (SUTURE) IMPLANT
SUT VIC AB 0 CT1 27 (SUTURE) ×1
SUT VIC AB 0 CT1 27XBRD ANBCTR (SUTURE) ×1 IMPLANT
SUT VIC AB 1 CT1 27 (SUTURE) ×1
SUT VIC AB 1 CT1 27XBRD ANBCTR (SUTURE) ×1 IMPLANT
SUT VIC AB 2-0 CT1 27 (SUTURE) ×1
SUT VIC AB 2-0 CT1 TAPERPNT 27 (SUTURE) ×1 IMPLANT
TOWEL OR 17X24 6PK STRL BLUE (TOWEL DISPOSABLE) ×2 IMPLANT
TOWEL OR 17X26 10 PK STRL BLUE (TOWEL DISPOSABLE) ×2 IMPLANT
TRAY CATH 16FR W/PLASTIC CATH (SET/KITS/TRAYS/PACK) IMPLANT
TRAY FOLEY CATH 16FRSI W/METER (SET/KITS/TRAYS/PACK) IMPLANT
WATER STERILE IRR 1000ML POUR (IV SOLUTION) ×4 IMPLANT

## 2016-02-28 NOTE — Anesthesia Procedure Notes (Signed)
Spinal  Patient location during procedure: OR Start time: 02/28/2016 12:13 PM End time: 02/28/2016 12:23 PM Staffing Anesthesiologist: Heather RobertsSINGER, Jamee Pacholski Performed: anesthesiologist  Preanesthetic Checklist Completed: patient identified, surgical consent, pre-op evaluation, timeout performed, IV checked, risks and benefits discussed and monitors and equipment checked Spinal Block Patient position: sitting Prep: DuraPrep Patient monitoring: cardiac monitor, continuous pulse ox and blood pressure Approach: midline Location: L2-3 Injection technique: single-shot Needle Needle type: Pencan  Needle gauge: 24 G Needle length: 9 cm Additional Notes Functioning IV was confirmed and monitors were applied. Sterile prep and drape, including hand hygiene and sterile gloves were used. The patient was positioned and the spine was prepped. The skin was anesthetized with lidocaine.  Free flow of clear CSF was obtained prior to injecting local anesthetic into the CSF.  The spinal needle aspirated freely following injection.  The needle was carefully withdrawn.  The patient tolerated the procedure well.

## 2016-02-28 NOTE — Progress Notes (Signed)
   Subjective:    Patient ID: Abigail Gomez, female    DOB: [birth year], 50 y.o.   MRN: [id number]  HPI Hyperlipidemia- pt was told by eye doctor last week that she had elevated cholesterol based on a physical finding on eye exam.  HTN- pt reports BP has been high 'off and on for 6 months'.  Has been seeing Dr Einar Gip but she reports they have not been concerned about her BP readings.  No CP, SOB, visual changes, edema.  H/o Lyme disease- pt is attempting to get an appt w/ [facility] and they are asking for repeat labs (RF, lyme disease, ESR, ANA, CRP)   Review of Systems For ROS see HPI     Objective:   Physical Exam  Constitutional: She is oriented to person, place, and time. She appears well-developed and well-nourished. No distress.  HENT:  Head: Normocephalic and atraumatic.  Eyes: Conjunctivae and EOM are normal. Pupils are equal, round, and reactive to light.  Neck: Normal range of motion. Neck supple. No thyromegaly present.  Cardiovascular: Normal rate, regular rhythm, normal heart sounds and intact distal pulses.   No murmur heard. Pulmonary/Chest: Effort normal and breath sounds normal. No respiratory distress.  Abdominal: Soft. She exhibits no distension. There is no tenderness.  Musculoskeletal: She exhibits no edema.  Lymphadenopathy:    She has no cervical adenopathy.  Neurological: She is alert and oriented to person, place, and time.  Skin: Skin is warm and dry.  Psychiatric: She has a normal mood and affect. Her behavior is normal.  Vitals reviewed.         Assessment & Plan:

## 2016-02-28 NOTE — Progress Notes (Signed)
Pre visit review using our clinic review tool, if applicable. No additional management support is needed unless otherwise documented below in the visit note. 

## 2016-02-28 NOTE — Assessment & Plan Note (Signed)
Pt was told that she had elevated cholesterol at her recent eye appt.  No record of this upon review of old labs.  Check labs today and start meds prn.  Pt expressed understanding and is in agreement w/ plan.

## 2016-02-28 NOTE — Assessment & Plan Note (Signed)
Ongoing issue for pt.  Has been seeing Dr Corliss Skainseveshwar but is now trying to get established w/ [facility].  Asking for repeat labs to facilitate this appt.  Ordered and will forward.

## 2016-02-28 NOTE — H&P (Signed)
TOTAL HIP ADMISSION H&P  Patient is admitted for right total hip arthroplasty.  Subjective:  Chief Complaint: right hip pain  HPI: Abigail Gomez, 50 y.o. female, has a history of pain and functional disability in the right hip(s) due to arthritis and patient has failed non-surgical conservative treatments for greater than 12 weeks to include NSAID's and/or analgesics, corticosteriod injections, supervised PT with diminished ADL's post treatment and activity modification.  Onset of symptoms was abrupt starting 2 years ago with gradually worsening course since that time.The patient noted no past surgery on the right hip(s).  Patient currently rates pain in the right hip at 10 out of 10 with activity. Patient has night pain, worsening of pain with activity and weight bearing, pain that interfers with activities of daily living and pain with passive range of motion. Patient has evidence of subchondral cysts by imaging studies. This condition presents safety issues increasing the risk of falls.  There is no current active infection.  Patient Active Problem List   Diagnosis Date Noted  . Hyperlipidemia 02/28/2016  . Osteoarthritis of right hip 02/28/2016  . Obstructive sleep apnea syndrome 02/01/2016  . Pain of right hip joint 02/01/2016  . Hypertension 01/31/2016  . Restless leg syndrome 01/31/2016  . Sickle cell trait (HCC) 01/31/2016  . Gastroesophageal reflux 01/31/2016  . History of bunionectomy of both great toes 01/31/2016  . History of pelvic fracture 01/31/2016  . Trochanteric bursitis, right hip 01/26/2016  . Anxiety and depression 09/19/2015  . Fibromyalgia 09/19/2015  . Migraines 09/19/2015  . Hypothyroidism 09/19/2015  . Insomnia 08/20/2015  . Hypersomnia 08/20/2015  . Depression 08/20/2015   Past Medical History:  Diagnosis Date  . Allergy   . Anemia   . Anxiety   . Depression   . Disease    lymes disease  . Fibromyalgia   . H/o Lyme disease   . H/O sickle cell  trait   . History of pelvic fracture   . History of urinary incontinence   . Hypertension   . Hypothyroidism   . Insomnia   . Labral tear of hip joint    Right Hip  . Migraine   . Osteoarthritis   . RLS (restless legs syndrome)   . Sleep apnea    cpap  . Thyroid disease   . TMJ syndrome   . Vitamin D deficiency     Past Surgical History:  Procedure Laterality Date  . ABDOMINAL HYSTERECTOMY    . BUNIONECTOMY Bilateral   . chiari malformation    . ENDOMETRIAL ABLATION    . MYOMECTOMY    . OOPHORECTOMY    . TONSILLECTOMY AND ADENOIDECTOMY      Prescriptions Prior to Admission  Medication Sig Dispense Refill Last Dose  . carisoprodol (SOMA) 350 MG tablet Take 1 tablet (350 mg total) by mouth daily. (Patient taking differently: Take 350 mg by mouth at bedtime. ) 30 tablet 4 02/27/2016 at Unknown time  . cholecalciferol (VITAMIN D) 1000 units tablet Take 1,000 Units by mouth daily.   02/27/2016 at Unknown time  . estradiol (VIVELLE-DOT) 0.075 MG/24HR Place 1 patch onto the skin every other day.    02/28/2016 at Unknown time  . hydrOXYzine (ATARAX/VISTARIL) 25 MG tablet Take 50 mg by mouth at bedtime.    Taking  . lidocaine (LIDODERM) 5 % Place 1 patch onto the skin daily. Remove & Discard patch within 12 hours or as directed by MD (Patient taking differently: Place 1 patch onto the skin daily  as needed (for pain.). Remove & Discard patch within 12 hours or as directed by MD ) 90 patch 1 Taking  . Magnesium Amino Acid Chelate 20 % POWD Take 450 mg by mouth at bedtime.   02/27/2016 at Unknown time  . Melatonin 10 MG TABS Take 10 mg by mouth at bedtime.    Past Month at Unknown time  . naproxen sodium (ANAPROX) 220 MG tablet Take 440 mg by mouth 3 (three) times daily as needed (for pain.).   Past Week at Unknown time  . OVER THE COUNTER MEDICATION Take 33.5 mg by mouth daily. Nature Thyroid OTC   02/28/2016 at Unknown time  . progesterone (PROMETRIUM) 100 MG capsule Take 100 mg by mouth  at bedtime.   3 Taking  . propranolol ER (INDERAL LA) 80 MG 24 hr capsule Take 80 mg by mouth daily.  5 02/28/2016 at Unknown time  . rizatriptan (MAXALT-MLT) 10 MG disintegrating tablet Take 10 mg by mouth daily as needed for migraine.    Past Week at Unknown time  . VITAMIN B COMPLEX-C PO Take 1 capsule by mouth daily.   Past Week at Unknown time  . Vortioxetine HBr (TRINTELLIX) 20 MG TABS Take 20 mg by mouth daily.    02/28/2016 at Unknown time  . zonisamide (ZONEGRAN) 50 MG capsule Take 150 mg by mouth at bedtime.   Taking  . hydrochlorothiazide (HYDRODIURIL) 12.5 MG tablet Take 1 tablet (12.5 mg total) by mouth daily. 30 tablet 3 Unknown at Unknown time   Allergies  Allergen Reactions  . Penicillins Other (See Comments)    Syncope Has patient had a PCN reaction causing immediate rash, facial/tongue/throat swelling, SOB or lightheadedness with hypotension:Yes Has patient had a PCN reaction causing severe rash involving mucus membranes or skin necrosis:No Has patient had a PCN reaction that required hospitalization:No Has patient had a PCN reaction occurring within the last 10 years:No If all of the above answers are "NO", then may proceed with Cephalosporin use.   . Food     Nuts-itching/swelling Bananas-itching/swelling  . Lactose Intolerance (Gi) Other (See Comments)    G.I. upset  . Lisinopril Cough  . Percocet [Oxycodone-Acetaminophen] Itching    Social History  Substance Use Topics  . Smoking status: Never Smoker  . Smokeless tobacco: Never Used  . Alcohol use 0.0 oz/week     Comment: occ    Family History  Problem Relation Age of Onset  . Hyperlipidemia Mother   . Diabetes Father   . Hypertension Father   . Cancer Father     prostate  . Diabetes Sister   . Hypertension Sister   . Arthritis Maternal Grandmother   . Stroke Maternal Grandmother   . Hypertension Maternal Grandmother   . Hyperlipidemia Maternal Grandmother   . Hyperkalemia Maternal Grandmother   .  Heart disease Maternal Grandmother   . Arthritis Paternal Grandmother   . Diabetes Sister      Review of Systems  Musculoskeletal: Positive for joint pain.  All other systems reviewed and are negative.   Objective:  Physical Exam  Constitutional: She is oriented to person, place, and time. She appears well-developed and well-nourished.  HENT:  Head: Normocephalic and atraumatic.  Eyes: EOM are normal. Pupils are equal, round, and reactive to light.  Neck: Normal range of motion. Neck supple.  Cardiovascular: Normal rate and regular rhythm.   Respiratory: Effort normal and breath sounds normal.  GI: Soft. Bowel sounds are normal.  Musculoskeletal:  Right hip: She exhibits decreased range of motion, decreased strength, tenderness and bony tenderness.  Neurological: She is alert and oriented to person, place, and time.  Skin: Skin is warm and dry.  Psychiatric: She has a normal mood and affect.    Vital signs in last 24 hours: Temp:  [98.1 F (36.7 C)] 98.1 F (36.7 C) (12/19 0849) Pulse Rate:  [55-93] 93 (12/19 1018) Resp:  [17-18] 18 (12/19 1018) BP: (140-168)/(89-90) 168/90 (12/19 1018) SpO2:  [99 %-100 %] 100 % (12/19 1018) Weight:  [182 lb 6 oz (82.7 kg)] 182 lb 6 oz (82.7 kg) (12/19 0849)  Labs:   Estimated body mass index is 27.73 kg/m as calculated from the following:   Height as of an earlier encounter on 02/28/16: 5\' 8"  (1.727 m).   Weight as of an earlier encounter on 02/28/16: 182 lb 6 oz (82.7 kg).   Imaging Review Plain radiographs demonstrate moderate degenerative joint disease of the right hip(s). The bone quality appears to be excellent for age and reported activity level.  Assessment/Plan:  End stage arthritis, right hip(s)  The patient history, physical examination, clinical judgement of the provider and imaging studies are consistent with end stage degenerative joint disease of the right hip(s) and total hip arthroplasty is deemed medically  necessary. The treatment options including medical management, injection therapy, arthroscopy and arthroplasty were discussed at length. The risks and benefits of total hip arthroplasty were presented and reviewed. The risks due to aseptic loosening, infection, stiffness, dislocation/subluxation,  thromboembolic complications and other imponderables were discussed.  The patient acknowledged the explanation, agreed to proceed with the plan and consent was signed. Patient is being admitted for inpatient treatment for surgery, pain control, PT, OT, prophylactic antibiotics, VTE prophylaxis, progressive ambulation and ADL's and discharge planning.The patient is planning to be discharged home with home health services

## 2016-02-28 NOTE — Transfer of Care (Signed)
Immediate Anesthesia Transfer of Care Note  Patient: Abigail Gomez  Procedure(s) Performed: Procedure(s): RIGHT TOTAL HIP ARTHROPLASTY ANTERIOR APPROACH (Right)  Patient Location: PACU  Anesthesia Type:Spinal  Level of Consciousness: awake, alert  and patient cooperative  Airway & Oxygen Therapy: Patient Spontanous Breathing and Patient connected to nasal cannula oxygen  Post-op Assessment: Report given to RN and Post -op Vital signs reviewed and stable  Post vital signs: Reviewed and stable  Last Vitals:  Vitals:   02/28/16 1018 02/28/16 1415  BP: (!) 168/90   Pulse: 93   Resp: 18   Temp:  (P) 36.3 C    Last Pain: There were no vitals filed for this visit.       Complications: No apparent anesthesia complications

## 2016-02-28 NOTE — Patient Instructions (Addendum)
Schedule your complete physical in 6 months We'll notify you of your lab results and make any changes if needed Add HCTZ once daily for better BP control Continue the Propranolol daily Call with any questions or concerns GOOD LUCK TODAY!!! Happy Holidays!!!

## 2016-02-28 NOTE — Assessment & Plan Note (Signed)
Deteriorated.  BP has been elevated at several of her recent visits.  Will add HCTZ and monitor for improvement.  Pt expressed understanding and is in agreement w/ plan.

## 2016-02-28 NOTE — Anesthesia Postprocedure Evaluation (Signed)
Anesthesia Post Note  Patient: Abigail Gomez  Procedure(s) Performed: Procedure(s) (LRB): RIGHT TOTAL HIP ARTHROPLASTY ANTERIOR APPROACH (Right)  Patient location during evaluation: PACU Anesthesia Type: Spinal and MAC Level of consciousness: awake and alert Pain management: pain level controlled Vital Signs Assessment: post-procedure vital signs reviewed and stable Respiratory status: spontaneous breathing and respiratory function stable Cardiovascular status: blood pressure returned to baseline and stable Postop Assessment: spinal receding Anesthetic complications: no       Last Vitals:  Vitals:   02/28/16 1544 02/28/16 1545  BP: 116/80   Pulse:  (!) 50  Resp: 11 (!) 9  Temp:      Last Pain:  Vitals:   02/28/16 1545  PainSc: 10-Worst pain ever    LLE Motor Response: Purposeful movement (02/28/16 1545)   RLE Motor Response: Purposeful movement (02/28/16 1545)   L Sensory Level: L5-Outer lower leg, top of foot, great toe (02/28/16 1545) R Sensory Level: L5-Outer lower leg, top of foot, great toe (02/28/16 1545)  Vannary Greening DANIEL

## 2016-02-28 NOTE — Anesthesia Preprocedure Evaluation (Addendum)
Anesthesia Evaluation  Patient identified by MRN, date of birth, ID band Patient awake    Reviewed: Allergy & Precautions, NPO status , Patient's Chart, lab work & pertinent test results  History of Anesthesia Complications Negative for: history of anesthetic complications  Airway Mallampati: II  TM Distance: >3 FB Neck ROM: Full    Dental no notable dental hx. (+) Dental Advisory Given   Pulmonary sleep apnea ,    Pulmonary exam normal        Cardiovascular hypertension, negative cardio ROS Normal cardiovascular exam     Neuro/Psych  Headaches, PSYCHIATRIC DISORDERS Anxiety Depression negative psych ROS   GI/Hepatic Neg liver ROS, GERD  ,  Endo/Other  Hypothyroidism   Renal/GU negative Renal ROS  negative genitourinary   Musculoskeletal negative musculoskeletal ROS (+)   Abdominal   Peds negative pediatric ROS (+)  Hematology negative hematology ROS (+)   Anesthesia Other Findings   Reproductive/Obstetrics negative OB ROS                            Anesthesia Physical Anesthesia Plan  ASA: III  Anesthesia Plan: Spinal   Post-op Pain Management:    Induction: Intravenous  Airway Management Planned: Natural Airway and Simple Face Mask  Additional Equipment:   Intra-op Plan:   Post-operative Plan:   Informed Consent: I have reviewed the patients History and Physical, chart, labs and discussed the procedure including the risks, benefits and alternatives for the proposed anesthesia with the patient or authorized representative who has indicated his/her understanding and acceptance.   Dental advisory given  Plan Discussed with: CRNA and Anesthesiologist  Anesthesia Plan Comments:         Anesthesia Quick Evaluation

## 2016-02-28 NOTE — Brief Op Note (Signed)
02/28/2016  1:52 PM  PATIENT:  Trellis MomentNicole N Devita  50 y.o. female  PRE-OPERATIVE DIAGNOSIS:  right hip osteoarthritis  POST-OPERATIVE DIAGNOSIS:  right hip osteoarthritis  PROCEDURE:  Procedure(s): RIGHT TOTAL HIP ARTHROPLASTY ANTERIOR APPROACH (Right)  SURGEON:  Surgeon(s) and Role:    * Kathryne Hitchhristopher Y Dhilan Brauer, MD - Primary  PHYSICIAN ASSISTANT: Rexene EdisonGil Clark, PA-C  ANESTHESIA:   spinal  EBL: 300 cc  COUNTS:  YES  DICTATION: .Other Dictation: Dictation Number [id number]  PLAN OF CARE: Admit to inpatient   PATIENT DISPOSITION:  PACU - hemodynamically stable.   Delay start of Pharmacological VTE agent (>24hrs) due to surgical blood loss or risk of bleeding: no

## 2016-02-29 ENCOUNTER — Encounter (HOSPITAL_COMMUNITY): Payer: Self-pay

## 2016-02-29 LAB — CBC
HEMATOCRIT: 28.1 % — AB (ref 36.0–46.0)
Hemoglobin: 9.9 g/dL — ABNORMAL LOW (ref 12.0–15.0)
MCH: 28.7 pg (ref 26.0–34.0)
MCHC: 35.2 g/dL (ref 30.0–36.0)
MCV: 81.4 fL (ref 78.0–100.0)
PLATELETS: 129 10*3/uL — AB (ref 150–400)
RBC: 3.45 MIL/uL — ABNORMAL LOW (ref 3.87–5.11)
RDW: 13.3 % (ref 11.5–15.5)
WBC: 6.5 10*3/uL (ref 4.0–10.5)

## 2016-02-29 LAB — BASIC METABOLIC PANEL
ANION GAP: 6 (ref 5–15)
BUN: 11 mg/dL (ref 6–20)
CALCIUM: 8.4 mg/dL — AB (ref 8.9–10.3)
CO2: 28 mmol/L (ref 22–32)
Chloride: 99 mmol/L — ABNORMAL LOW (ref 101–111)
Creatinine, Ser: 0.74 mg/dL (ref 0.44–1.00)
Glucose, Bld: 109 mg/dL — ABNORMAL HIGH (ref 65–99)
Potassium: 4.1 mmol/L (ref 3.5–5.1)
Sodium: 133 mmol/L — ABNORMAL LOW (ref 135–145)

## 2016-02-29 LAB — ANA: ANA: NEGATIVE

## 2016-02-29 LAB — RHEUMATOID FACTOR: Rheumatoid fact SerPl-aCnc: <14 IU/mL (ref <14)

## 2016-02-29 NOTE — Evaluation (Signed)
Occupational Therapy Evaluation Patient Details Name: Abigail Gomez MRN: [id number] DOB: [birth year] Today's Date: 02/29/2016    History of Present Illness 50 yo female with onset of OA from previous injury involving pelvic fracture, now THA with WBAT and direct anterior approach   Clinical Impression   This 50 yo female admitted and underwent above presents to acute OT with deficits below (see OT problem list) thus affecting her PLOF of being independent with basic ADLs. She will benefit from acute OT without need for follow up.     Follow Up Recommendations  No OT follow up;Supervision/Assistance - 24 hour;Other (comment) (24 hour intially)    Equipment Recommendations  3 in 1 bedside commode       Precautions / Restrictions Precautions Precautions: Fall Precaution Comments: groggy from nausea meds Restrictions Weight Bearing Restrictions: No      Mobility Bed Mobility  Pt deferred OOB at this time                        ADL Overall ADL's : Needs assistance/impaired                                       General ADL Comments: Educated pt on most efficent way of getting dressed whether her mom is A'ing her or she is trying to do it herself.               Pertinent Vitals/Pain Pain Assessment: 0-10 Pain Score: 6  Pain Location: right incision site and thigh Pain Descriptors / Indicators: Sore;Tightness Pain Intervention(s): Monitored during session;Ice applied     Hand Dominance Right   Extremity/Trunk Assessment Upper Extremity Assessment Upper Extremity Assessment: Overall WFL for tasks assessed           Communication Communication Communication: No difficulties   Cognition Arousal/Alertness: Lethargic Behavior During Therapy: WFL for tasks assessed/performed Overall Cognitive Status: Within Functional Limits for tasks assessed                                Home Living Family/patient expects to be  discharged to:: Private residence Living Arrangements: Alone Available Help at Discharge: Family;Available 24 hours/day (states her mom will be with her a couple of weeks) Type of Home: House Home Access: Level entry     Home Layout: Two level;Able to live on main level with bedroom/bathroom     Bathroom Shower/Tub:  (tub shower with curtain and HHA shower head; walk in shower with door and no hand held shower)   Bathroom Toilet: Standard     Home Equipment: Walker - standard;Cane - single point          Prior Functioning/Environment Level of Independence: Independent                 OT Problem List: Decreased strength;Decreased range of motion;Decreased knowledge of use of DME or AE;Impaired balance (sitting and/or standing)   OT Treatment/Interventions: Self-care/ADL training;Therapeutic activities;Patient/family education;DME and/or AE instruction;Balance training    OT Goals(Current goals can be found in the care plan section) Acute Rehab OT Goals Patient Stated Goal: to get home soon OT Goal Formulation: With patient Time For Goal Achievement: 03/07/16 Potential to Achieve Goals: Good ADL Goals Pt Will Perform Grooming: with set-up;with supervision;standing Pt Will Perform Lower Body Dressing:  (pt will be  able to state how her mom needs to her A) Pt Will Transfer to Toilet: with supervision;ambulating;bedside commode (over toilet) Pt Will Perform Toileting - Clothing Manipulation and hygiene: with supervision;sit to/from stand Pt Will Perform Tub/Shower Transfer: Tub transfer;Shower transfer;ambulating;3 in 1;rolling walker (pt has both as need to see which set up she wants to do)  OT Frequency: Min 2X/week              End of Session    Activity Tolerance: Patient limited by lethargy Patient left: in bed;with call bell/phone within reach   Time: 1450-1459 OT Time Calculation (min): 9 min Charges:  OT General Charges $OT Visit: 1 Procedure OT  Evaluation $OT Eval Low Complexity: 1 Procedure  Evette GeorgesLeonard, Jakeya Gherardi Eva [phone] 02/29/2016, 4:09 PM

## 2016-02-29 NOTE — Progress Notes (Signed)
Subjective: 1 Day Post-Op Procedure(s) (LRB): RIGHT TOTAL HIP ARTHROPLASTY ANTERIOR APPROACH (Right) Patient reports pain as moderate.    Objective: Vital signs in last 24 hours: Temp:  [97.4 F (36.3 C)-98.1 F (36.7 C)] 97.7 F (36.5 C) (12/20 0626) Pulse Rate:  [44-103] 49 (12/20 0626) Resp:  [7-22] 16 (12/20 0626) BP: (87-168)/(60-91) 102/64 (12/20 0626) SpO2:  [99 %-100 %] 100 % (12/20 0626) Weight:  [182 lb 6 oz (82.7 kg)] 182 lb 6 oz (82.7 kg) (12/19 0849)  Intake/Output from previous day: 12/19 0701 - 12/20 0700 In: 1250 [I.V.:1250] Out: 1350 [Urine:1050; Blood:300] Intake/Output this shift: No intake/output data recorded.   Recent Labs  02/27/16 0851  HGB 12.7    Recent Labs  02/27/16 0851  WBC 5.1  RBC 4.55  HCT 37.0  PLT 161    Recent Labs  02/27/16 0851  NA 139  K 4.3  CL 103  CO2 31  BUN 10  CREATININE 0.82  GLUCOSE 99  CALCIUM 9.5   No results for input(s): LABPT, INR in the last 72 hours.  Sensation intact distally Intact pulses distally Dorsiflexion/Plantar flexion intact Incision: dressing C/D/I  Assessment/Plan: 1 Day Post-Op Procedure(s) (LRB): RIGHT TOTAL HIP ARTHROPLASTY ANTERIOR APPROACH (Right) Up with therapy  Kathryne HitchChristopher Y Moyses Pavey 02/29/2016, 7:49 AM

## 2016-02-29 NOTE — Progress Notes (Signed)
PT Cancellation Note  Patient Details Name: Abigail Gomez MRN: [id number] DOB: 05-Jan-1966   Cancelled Treatment:    Reason Eval/Treat Not Completed: Fatigue/lethargy limiting ability to participate (Pt reports she is not feeling well, nauseated and had meds) to relieve, now tired and trying to rest.  Nursing is concerned as pt is leaving tomorrow.   Ivar DrapeStout, Zoltan Genest E 02/29/2016, 5:12 PM   Samul Dadauth Taylynn Easton, PT MS Acute Rehab Dept. Number: [facility] [phone] and [facility] [phone]

## 2016-02-29 NOTE — Evaluation (Signed)
Physical Therapy Evaluation Patient Details Name: Abigail Gomez MRN: [id number] DOB: 10-22-1965 Today's Date: 02/29/2016   History of Present Illness  50 yo female with onset of OA from previous injury involving pelvic fracture, now THA with WBAT and direct anterior approach  Clinical Impression  Pt is up to walk with assistance and is having some difficulty with pain and her feeling of being tired.  O2 sats sustained the activity with cannula off, was 97% with effort and not SOB or otherwise challenged.  Pt is left up in chair with call light and will follow acutely for strengthening and for establishing safe mobility for home.    Follow Up Recommendations Home health PT;Supervision - Intermittent;Supervision for mobility/OOB    Equipment Recommendations  Rolling walker with 5" wheels;3in1 (PT)    Recommendations for Other Services       Precautions / Restrictions Precautions Precautions: Fall Precaution Comments: groggy from surgery Restrictions Weight Bearing Restrictions: No      Mobility  Bed Mobility Overal bed mobility: Needs Assistance Bed Mobility: Supine to Sit     Supine to sit: Min assist     General bed mobility comments: mainly assisting RLE  Transfers Overall transfer level: Needs assistance Equipment used: Rolling walker (2 wheeled);1 person hand held assist Transfers: Sit to/from UGI CorporationStand;Stand Pivot Transfers Sit to Stand: Min assist Stand pivot transfers: Min guard       General transfer comment: cues for hand placement  Ambulation/Gait Ambulation/Gait assistance: Min assist Ambulation Distance (Feet): 70 Feet Assistive device: Rolling walker (2 wheeled);1 person hand held assist Gait Pattern/deviations: Step-through pattern;Wide base of support;Decreased stride length;Decreased stance time - right;Decreased weight shift to right Gait velocity: reduced Gait velocity interpretation: Below normal speed for age/gender    Stairs Stairs:  (no  steps to home)          Wheelchair Mobility    Modified Rankin (Stroke Patients Only)       Balance Overall balance assessment: Needs assistance Sitting-balance support: Feet supported Sitting balance-Leahy Scale: Fair   Postural control: Posterior lean Standing balance support: Bilateral upper extremity supported Standing balance-Leahy Scale: Poor                               Pertinent Vitals/Pain Pain Assessment: 0-10 Pain Score: 7  Pain Location: R hip incision and thigh Pain Descriptors / Indicators: Aching Pain Intervention(s): Monitored during session;Premedicated before session;Repositioned;Ice applied    Home Living Family/patient expects to be discharged to:: Private residence Living Arrangements: Alone Available Help at Discharge: Family;Available PRN/intermittently Type of Home: House Home Access: Level entry     Home Layout: Two level;Able to live on main level with bedroom/bathroom Home Equipment: Walker - standard;Cane - single point Additional Comments: will need RW for home    Prior Function Level of Independence: Independent               Hand Dominance        Extremity/Trunk Assessment   Upper Extremity Assessment Upper Extremity Assessment: Overall WFL for tasks assessed    Lower Extremity Assessment Lower Extremity Assessment: RLE deficits/detail RLE Deficits / Details: new hip replacement with expected weakness       Communication   Communication: No difficulties  Cognition Arousal/Alertness: Lethargic Behavior During Therapy: WFL for tasks assessed/performed Overall Cognitive Status: Within Functional Limits for tasks assessed  General Comments General comments (skin integrity, edema, etc.): Depending on walker for standing stability and has good effort with her steps, close guard with recliner    Exercises General Exercises - Lower Extremity Ankle Circles/Pumps: AROM;Both;10  reps Quad Sets: AROM;Both;10 reps   Assessment/Plan    PT Assessment Patient needs continued PT services  PT Problem List Decreased strength;Decreased range of motion;Decreased activity tolerance;Decreased balance;Decreased mobility;Decreased coordination;Decreased knowledge of use of DME;Cardiopulmonary status limiting activity;Decreased skin integrity;Pain          PT Treatment Interventions DME instruction;Gait training;Stair training;Functional mobility training;Therapeutic activities;Therapeutic exercise;Balance training;Neuromuscular re-education;Patient/family education    PT Goals (Current goals can be found in the Care Plan section)  Acute Rehab PT Goals Patient Stated Goal: to get home soon PT Goal Formulation: With patient Time For Goal Achievement: 03/14/16 Potential to Achieve Goals: Good    Frequency 7X/week   Barriers to discharge Decreased caregiver support home alone    Co-evaluation               End of Session Equipment Utilized During Treatment: Gait belt (removed O2 and sats were 99% with mobility) Activity Tolerance: Patient tolerated treatment well;Patient limited by pain Patient left: in chair;with call bell/phone within reach;Other (comment) (ice applied) Nurse Communication: Mobility status;Patient requests pain meds         Time: 2956-21301038-1111 PT Time Calculation (min) (ACUTE ONLY): 33 min   Charges:   PT Evaluation $PT Eval Moderate Complexity: 1 Procedure PT Treatments $Gait Training: 8-22 mins   PT G Codes:        Ivar DrapeStout, Dandrea Widdowson E 02/29/2016, 1:15 PM  Samul Dadauth Raymund Manrique, PT MS Acute Rehab Dept. [facility]: [facility] [phone] and [facility] [phone]

## 2016-02-29 NOTE — Op Note (Signed)
[facility]:  Gomez, Abigail               ACCOUNT NO.:  [id number]  MEDICAL RECORD NO.:  [id number]  LOCATION:                                 FACILITY:  PHYSICIAN:  Vanita PandaChristopher Y. Magnus IvanBlackman, M.D.DATE OF BIRTH:  September 05, 1965  DATE OF PROCEDURE:  02/28/2016 DATE OF DISCHARGE:                              OPERATIVE REPORT   PREOPERATIVE DIAGNOSIS:  Primary osteoarthritis and degenerative joint disease, right hip.  POSTOPERATIVE DIAGNOSES:  Primary osteoarthritis and degenerative joint disease, right hip.  PROCEDURE:  Right total hip arthroplasty through direct anterior approach.  IMPLANTS:  DePuy Sector Gription acetabular component size 50, with two screws, size 32+ 0 neutral polyethylene liner, size 11 Corail femoral component with standard offset, size 32+ 1 ceramic hip ball.  SURGEON:  Vanita PandaChristopher Y. Magnus IvanBlackman, M.D.  ASSISTANT:  Richardean CanalGilbert Clark, PA-C.  ANESTHESIA:  Spinal.  ANTIBIOTICS:  900 mg of IV clindamycin.  BLOOD LOSS:  300 mL.  COMPLICATIONS:  None.  INDICATIONS:  Abigail Gomez is a very pleasant, 50 year old female with debilitating right hip pain for some time now.  We obtained an MRI of her hip that showed cystic changes mainly in the anterior acetabulum and superior acetabulum.  She also had likely labral tearing.  We sent her to a hip arthroscopy specialist who recommended that she undergo total hip replacement.  She tried and failed all forms of conservative treatment including activity modification, anti-inflammatories, as well as intra-articular injection.  Her pain is definitely affecting her activities of daily living, her mobility, and quality of life, all detrimentally.  At this point, she does wish to proceed with a total hip arthroplasty given the failure of all forms of conservative treatment. She understands the risk of acute blood loss anemia, nerve and vessel injury, fracture, infection, dislocation, and DVT.  She understands our goals are decreased pain,  improved mobility, and overall improved quality of life.  PROCEDURE DESCRIPTION:  After informed consent was obtained, appropriate right hip was marked.  She was brought to the operating room where spinal anesthesia was obtained while she was on her stretcher.  A Foley catheter was placed and both feet had traction boots applied to them. She was next placed supine on the HANA fracture table with the perineal post in place and both legs in inline skeletal traction devices, but no traction applied.  Her right operative hip was then prepped and draped with DuraPrep and sterile drapes.  A time-out was called and she was identified as correct patient and correct right hip.  We then made incision just inferior and posterior to the anterior superior iliac spine and carried this obliquely down the leg.  We dissected down tensor fascia lata muscle and tensor fascia was then divided longitudinally, so we could proceed with direct anterior approach to the hip.  We identified and cauterized the circumflex vessels and then identified the hip capsule, opened up the hip capsule in an L-type format finding actually an effusion as well significant synovitis.  We placed Cobra retractors around the medial and lateral femoral neck and then made our femoral neck cut proximal to the lesser trochanter with an oscillating saw and completed this with an osteotome.  We placed  a corkscrew guide in the femoral head and found a large area devoid of cartilage on the femoral head, which was much worse than what the MRI showed that correlates with the patient's pain.  We then placed a bent Hohmann over the medial acetabular rim and removed remnants of acetabular labrum and other soft tissue.  We then began reaming under direct visualization from a size 42 reamer in stepwise increments up to a size 50 with all reamers under direct visualization and the last reamer also under direct fluoroscopy, so I could obtain our  depth of reaming, my inclination and anteversion.  Once I was pleased with this, I placed the real DePuy Sector Gription acetabular component size 50, 2 screws given her young bone and then a 32+ 0 neutral polyethylene liner for that size acetabular component.  Attention was then turned to the femur.  With the leg externally rotated to 120 degrees, extended and adducted, we were able to place a Mueller retractor medially and a Hohmann retractor behind the greater trochanter.  We released the lateral joint capsule and used a box cutting osteotome to enter femoral canal and a rongeur to lateralize.  We then began broaching from a size 8 broach using the Corail broaching system up to a size 11.  We then used a calcar planer off this and then trialed a standard offset femoral neck and a 32+ 1 hip ball.  We brought the leg back over and up with traction and rotation. We were pleased with leg length, offset, range of motion, and stability. We then dislocated the hip and removed the trial components.  We were able to place the real Corail femoral component with standard offset size 11 and the real 32+ 1 ceramic hip ball, reduced this in the acetabulum.  Again, we were pleased with stability.  We then irrigated the soft tissue with normal saline solution using pulsatile lavage.  We were able to close the joint capsule interrupted #1 Ethibond suture followed by running #1 Vicryl in the tensor fascia, 0-Vicryl in the deep tissue, 2-0 Vicryl in the subcutaneous tissue, 4-0 Monocryl subcuticular stitch, and Steri-Strips on the skin.  An Aquacel dressing was applied. She was taken off the HANA table and taken to the recovery room in a stable condition.  All final counts were correct.  There were no complications noted.  Of note, Richardean CanalGilbert Clark, PA-C assisted in entire case.  His assistance was crucial for facilitating all aspects of this case.     [name],  M.D.   ______________________________ [name], M.D.    CYB/MEDQ  D:  02/28/2016  T:  02/29/2016  Job:  [id number]

## 2016-02-29 NOTE — Plan of Care (Signed)
Problem: Physical Regulation: Goal: Will remain free from infection Outcome: Progressing No s/s of infection noted  Problem: Tissue Perfusion: Goal: Risk factors for ineffective tissue perfusion will decrease Outcome: Progressing SCDs on , no s/s of DVT noted  Problem: Bowel/Gastric: Goal: Will not experience complications related to bowel motility Outcome: Progressing No gastric or bowel issues noted  Problem: Activity: Goal: Will remain free from falls Outcome: Progressing Safety precautions and fall preventions maintained

## 2016-02-29 NOTE — Progress Notes (Signed)
Pt restting in bed after receiving 4 mg IV zoysyn for nausea.  B/P 96/46. Encouraged pt to drink more po fluids. Will observe

## 2016-03-01 ENCOUNTER — Encounter: Payer: Self-pay | Admitting: General Practice

## 2016-03-01 ENCOUNTER — Ambulatory Visit (INDEPENDENT_AMBULATORY_CARE_PROVIDER_SITE_OTHER): Payer: BLUE CROSS/BLUE SHIELD | Admitting: Orthopaedic Surgery

## 2016-03-01 LAB — CBC
HCT: 24.9 % — ABNORMAL LOW (ref 36.0–46.0)
Hemoglobin: 8.8 g/dL — ABNORMAL LOW (ref 12.0–15.0)
MCH: 28.9 pg (ref 26.0–34.0)
MCHC: 35.3 g/dL (ref 30.0–36.0)
MCV: 81.9 fL (ref 78.0–100.0)
Platelets: 112 10*3/uL — ABNORMAL LOW (ref 150–400)
RBC: 3.04 MIL/uL — ABNORMAL LOW (ref 3.87–5.11)
RDW: 13.5 % (ref 11.5–15.5)
WBC: 7.1 10*3/uL (ref 4.0–10.5)

## 2016-03-01 MED ORDER — ASPIRIN 81 MG PO CHEW: 81.0000 mg | CHEWABLE_TABLET | Freq: Two times a day (BID) | ORAL | 0 refills | Status: DC

## 2016-03-01 MED ORDER — OXYCODONE-ACETAMINOPHEN 5-325 MG PO TABS: 1.0000 | ORAL_TABLET | ORAL | 0 refills | Status: DC | PRN

## 2016-03-01 MED ORDER — METHOCARBAMOL 500 MG PO TABS: 500.0000 mg | ORAL_TABLET | Freq: Four times a day (QID) | ORAL | 0 refills | Status: DC | PRN

## 2016-03-01 NOTE — Progress Notes (Signed)
Subjective: 2 Days Post-Op Procedure(s) (LRB): RIGHT TOTAL HIP ARTHROPLASTY ANTERIOR APPROACH (Right) Patient reports pain as moderate.  Denies any dizziness or light headedness today . Has been up to bathroom by self. Eating drinking well. Would like to discharge home tomorrow if does well with PT today.  Objective: Vital signs in last 24 hours: Temp:  [97.3 F (36.3 C)-99.1 F (37.3 C)] 98.5 F (36.9 C) (12/21 0550) Pulse Rate:  [57-64] 61 (12/21 0550) Resp:  [16] 16 (12/21 0550) BP: (85-100)/(41-46) 92/44 (12/21 0550) SpO2:  [94 %-100 %] 94 % (12/21 0550)  Intake/Output from previous day: 12/20 0701 - 12/21 0700 In: 840 [P.O.:840] Out: 400 [Urine:400] Intake/Output this shift: Total I/O In: 240 [P.O.:240] Out: -    Recent Labs  02/29/16 0711 03/01/16 0538  HGB 9.9* 8.8*    Recent Labs  02/29/16 0711 03/01/16 0538  WBC 6.5 7.1  RBC 3.45* 3.04*  HCT 28.1* 24.9*  PLT 129* 112*    Recent Labs  02/29/16 0711  NA 133*  K 4.1  CL 99*  CO2 28  BUN 11  CREATININE 0.74  GLUCOSE 109*  CALCIUM 8.4*   No results for input(s): LABPT, INR in the last 72 hours.  Right hip: Sensation intact distally Dorsiflexion/Plantar flexion intact Incision: dressing C/D/I Compartment soft  Assessment/Plan: 2 Days Post-Op Procedure(s) (LRB): RIGHT TOTAL HIP ARTHROPLASTY ANTERIOR APPROACH (Right) Up with therapy Plan for discharge tomorrow to home.  Monitor for symptoms of anemia today , Post Op Anemia with a Hgb of 8.8 .  Hypotension will monitor.   Davanna He 03/01/2016, 11:16 AM

## 2016-03-01 NOTE — Progress Notes (Signed)
Called pt and lmovm to return call.

## 2016-03-01 NOTE — Discharge Instructions (Signed)

## 2016-03-01 NOTE — Progress Notes (Signed)
Occupational Therapy Treatment Patient Details Name: Abigail Gomez MRN: [id number] DOB: [birth year] Today's Date: 03/01/2016    History of present illness 50 yo female with onset of OA from previous injury involving pelvic fracture, now THA with WBAT and direct anterior approach   OT comments  Pt making limited progress towards goals today. Session focused on tub transfer using 3 in 1 education. Pt educated and OT demonstrated. Pt verbally confirmed understanding. Pt also performing bed level grooming this session. OT reinforced sequencing for dressing and BSC transfer. Pt with no questions or concerns for OT at EOS.   Follow Up Recommendations  No OT follow up;Supervision/Assistance - 24 hour;Other (comment) (initially)    Equipment Recommendations  3 in 1 bedside commode    Recommendations for Other Services      Precautions / Restrictions Precautions Precautions: Fall Precaution Comments: direct anterior approach Restrictions Weight Bearing Restrictions: No Other Position/Activity Restrictions: WBAT       Mobility Bed Mobility Overal bed mobility: Needs Assistance           General bed mobility comments: Pt able to sit up long sitting in bed for bed level grooming  Transfers                 Balance                      ADL Overall ADL's : Needs assistance/impaired     Grooming: Wash/dry face;Wash/dry hands;Oral care;Set up;Bed level Grooming Details (indicate cue type and reason): Pt declined OOB at this time stating "I got up with PT twice today", Per PT notes Pt slow but doing well. OT's clinical judgement says that Pt is capable of more than bed level.                         Tub/ Shower Transfer: Tub transfer;Minimal assistance;Min guard;Anterior/posterior;3 in Scientist, water quality1;Rolling walker Tub/Shower Transfer Details (indicate cue type and reason): Pt educated, demonstrated by OT and provided handout. Pt declined OOB at this time to  practice, and declined practice tomorrow. "I think I'll be fine, I'm just tired, This worksheet is good" Functional mobility during ADLs:  (not attempted this session) General ADL Comments: education reinforced for operated       Vision                     Perception     Praxis      Cognition   Behavior During Therapy: WFL for tasks assessed/performed;Flat affect Overall Cognitive Status: Within Functional Limits for tasks assessed                  General Comments: very slow overall    Extremity/Trunk Assessment               Exercises    Shoulder Instructions       General Comments      Pertinent Vitals/ Pain       Pain Assessment: 0-10 Pain Score: 5  Pain Location: R hip with walking Pain Descriptors / Indicators: Sore Pain Intervention(s): Limited activity within patient's tolerance;Monitored during session;Ice applied  Home Living                                          Prior Functioning/Environment  Frequency  Min 2X/week        Progress Toward Goals  OT Goals(current goals can now be found in the care plan section)  Progress towards OT goals: Progressing toward goals  Acute Rehab OT Goals Patient Stated Goal: to do hot yoga OT Goal Formulation: With patient Time For Goal Achievement: 03/07/16 Potential to Achieve Goals: Good  Plan Discharge plan remains appropriate    Co-evaluation                 End of Session     Activity Tolerance Patient limited by lethargy   Patient Left in bed;with call bell/phone within reach   Nurse Communication Mobility status        Time: 1450-1505 OT Time Calculation (min): 15 min  Charges: OT General Charges $OT Visit: 1 Procedure OT Treatments $Self Care/Home Management : 8-22 mins  Evern BioLaura J Jermar Colter 03/01/2016, 4:02 PM  Sherryl MangesLaura Karrissa Parchment OTR/L [phone]

## 2016-03-01 NOTE — Progress Notes (Signed)
Physical Therapy Treatment Patient Details Name: Abigail Gomez MRN: [id number] DOB: 1965-10-18 Today's Date: 03/01/2016    History of Present Illness 50 yo female with onset of OA from previous injury involving pelvic fracture, now THA with WBAT and direct anterior approach    PT Comments    Pt ambulated 190' with RW and performed THA exercises with min A. She reported light headedness in supine, sitting, and standing, she stated it's most pronounced in sitting. Seated BP 88/45, RN notified.   Follow Up Recommendations  Home health PT;Supervision - Intermittent;Supervision for mobility/OOB     Equipment Recommendations  Rolling walker with 5" wheels;3in1 (PT)    Recommendations for Other Services       Precautions / Restrictions Precautions Precautions: Fall Restrictions Weight Bearing Restrictions: No    Mobility  Bed Mobility Overal bed mobility: Needs Assistance Bed Mobility: Supine to Sit     Supine to sit: Supervision     General bed mobility comments: self assisted RLE with LLE  Transfers Overall transfer level: Needs assistance Equipment used: Rolling walker (2 wheeled) Transfers: Sit to/from Stand Sit to Stand: Min guard         General transfer comment: verbal cues for hand placement  Ambulation/Gait Ambulation/Gait assistance: Min assist Ambulation Distance (Feet): 90 Feet Assistive device: Rolling walker (2 wheeled) Gait Pattern/deviations: Decreased stride length;Decreased stance time - right;Decreased weight shift to right;Step-to pattern Gait velocity: reduced Gait velocity interpretation: Below normal speed for age/gender General Gait Details: slow but steady, pt reports mild dizziness with walking, BP 88/45 sitting, HR 59, SaO2 100% RA, RN notified of hypotension   Stairs            Wheelchair Mobility    Modified Rankin (Stroke Patients Only)       Balance Overall balance assessment: Needs assistance Sitting-balance  support: Feet supported Sitting balance-Leahy Scale: Fair   Postural control: Posterior lean Standing balance support: Bilateral upper extremity supported Standing balance-Leahy Scale: Poor                      Cognition Arousal/Alertness: Awake/alert (mildly lethargic) Behavior During Therapy: Flat affect Overall Cognitive Status: Within Functional Limits for tasks assessed                      Exercises General Exercises - Lower Extremity Ankle Circles/Pumps: AROM;Both;10 reps Quad Sets: AROM;Both;5 reps Gluteal Sets: AROM;Both;5 reps;Supine Short Arc Quad: AROM;Right;10 reps;Supine Heel Slides: AAROM;Right;10 reps;Supine Straight Leg Raises: AAROM;Right;10 reps;Supine    General Comments        Pertinent Vitals/Pain Pain Score: 8  Pain Location: right incision site and thigh, with walking Pain Descriptors / Indicators: Sore Pain Intervention(s): Limited activity within patient's tolerance;Monitored during session;Patient requesting pain meds-RN notified;Ice applied    Home Living                      Prior Function            PT Goals (current goals can now be found in the care plan section) Acute Rehab PT Goals Patient Stated Goal: to do hot yoga PT Goal Formulation: With patient Time For Goal Achievement: 03/14/16 Potential to Achieve Goals: Good Progress towards PT goals: Progressing toward goals    Frequency    7X/week      PT Plan Current plan remains appropriate    Co-evaluation             End of  Session Equipment Utilized During Treatment: Gait belt (removed O2 and sats were 99% with mobility) Activity Tolerance: Patient tolerated treatment well;Patient limited by pain Patient left: in chair;with call bell/phone within reach;Other (comment) (ice applied)     Time: 1610-96041110-1149 PT Time Calculation (min) (ACUTE ONLY): 39 min  Charges:  $Gait Training: 8-22 mins $Therapeutic Exercise: 8-22 mins $Therapeutic  Activity: 8-22 mins                    G Codes:      Tamala SerUhlenberg, Robel Wuertz Kistler 03/01/2016, 11:58 AM [phone]

## 2016-03-01 NOTE — Progress Notes (Signed)
Physical Therapy Treatment Patient Details Name: Abigail Gomez MRN: [id number] DOB: November 17, 1965 Today's Date: 03/01/2016    History of Present Illness 50 yo female with onset of OA from previous injury involving pelvic fracture, now THA with WBAT and direct anterior approach    PT Comments    Pt progressing with mobility, she ambulated 160' with RW and performed RLE exercises in standing. Activity tolerance limited by fatigue. Pt is somewhat groggy, stated she slept poorly last night.   Follow Up Recommendations  Home health PT;Supervision - Intermittent;Supervision for mobility/OOB     Equipment Recommendations  Rolling walker with 5" wheels;3in1 (PT)    Recommendations for Other Services       Precautions / Restrictions Precautions Precautions: Fall Precaution Comments: mildly lethargic Restrictions Weight Bearing Restrictions: No Other Position/Activity Restrictions: WBAT    Mobility  Bed Mobility Overal bed mobility: Needs Assistance Bed Mobility: Sit to Supine     Supine to sit: Supervision Sit to supine: Modified independent (Device/Increase time)   General bed mobility comments: self assisted RLE with LLE, used rail, HOB flat  Transfers Overall transfer level: Needs assistance Equipment used: Rolling walker (2 wheeled) Transfers: Sit to/from Stand Sit to Stand: Supervision         General transfer comment: verbal cues for hand placement  Ambulation/Gait Ambulation/Gait assistance: Min assist Ambulation Distance (Feet): 160 Feet Assistive device: Rolling walker (2 wheeled) Gait Pattern/deviations: Decreased stride length;Decreased stance time - right;Decreased weight shift to right;Step-through pattern Gait velocity: reduced Gait velocity interpretation: Below normal speed for age/gender General Gait Details: slow but steady, pt reports mild dizziness at rest and with walking, stated she's groggy from sleeping poorly last night   Stairs            Wheelchair Mobility    Modified Rankin (Stroke Patients Only)       Balance Overall balance assessment: Needs assistance Sitting-balance support: Feet supported Sitting balance-Leahy Scale: Fair   Postural control: Posterior lean Standing balance support: Bilateral upper extremity supported Standing balance-Leahy Scale: Poor                      Cognition Arousal/Alertness: Lethargic Behavior During Therapy: WFL for tasks assessed/performed;Flat affect Overall Cognitive Status: Within Functional Limits for tasks assessed                      Exercises Total Joint Exercises Hip ABduction/ADduction: AROM;Right;10 reps;Standing Marching in Standing:  (attempted, but unable 2* pain) Standing Hip Extension: AROM;Right;10 reps;Standing General Exercises - Lower Extremity Ankle Circles/Pumps: AROM;Both;10 reps Quad Sets: AROM;Both;5 reps Gluteal Sets: AROM;Both;5 reps;Supine Short Arc Quad: AROM;Right;10 reps;Supine Heel Slides: AAROM;Right;10 reps;Supine Straight Leg Raises: AAROM;Right;10 reps;Supine Mini-Sqauts: AROM;Both;5 reps;Standing    General Comments        Pertinent Vitals/Pain Pain Score: 3  Pain Location: R hip with walking Pain Descriptors / Indicators: Sore Pain Intervention(s): Premedicated before session;Monitored during session;Limited activity within patient's tolerance;Ice applied    Home Living                      Prior Function            PT Goals (current goals can now be found in the care plan section) Acute Rehab PT Goals Patient Stated Goal: to do hot yoga PT Goal Formulation: With patient Time For Goal Achievement: 03/14/16 Potential to Achieve Goals: Good Progress towards PT goals: Progressing toward goals    Frequency  7X/week      PT Plan Current plan remains appropriate    Co-evaluation             End of Session Equipment Utilized During Treatment: Gait belt (removed O2 and sats  were 99% with mobility) Activity Tolerance: Patient tolerated treatment well Patient left: with call bell/phone within reach;Other (comment);in bed (ice applied)     Time: [id number] PT Time Calculation (min) (ACUTE ONLY): 18 min  Charges:  $Gait Training: 8-22 mins $Therapeutic Exercise: 8-22 mins $Therapeutic Activity: 8-22 mins                    G Codes:      Tamala SerUhlenberg, Allex Lapoint Kistler 03/01/2016, 1:43 PM [phone]

## 2016-03-02 NOTE — Progress Notes (Signed)
Discharge instructions and prescriptions reviewed with patient, patient stated understanding. IV removed. Patient is waiting for her transportation to take her home Liel Rudden A Psychologist, counsellingColter, RN

## 2016-03-02 NOTE — Progress Notes (Signed)
Patient ID: Abigail Gomez, female   DOB: [birth year], 50 y.o.   MRN: [id number] Doing well overall.  Can be discharged to home today.

## 2016-03-02 NOTE — Progress Notes (Signed)
Physical Therapy Treatment Patient Details Name: Abigail Gomez MRN: [id number] DOB: August 17, 1965 Today's Date: 03/02/2016    History of Present Illness 50 yo female with onset of OA from previous injury involving pelvic fracture, now THA with WBAT and direct anterior approach    PT Comments    Patient is making good progress with PT.  From a mobility standpoint anticipate patient will be ready for DC home when medically ready.     Follow Up Recommendations  Home health PT;Supervision - Intermittent;Supervision for mobility/OOB     Equipment Recommendations  Rolling walker with 5" wheels;3in1 (PT)    Recommendations for Other Services       Precautions / Restrictions Precautions Precautions: Fall Precaution Comments: direct anterior approach Restrictions Other Position/Activity Restrictions: WBAT    Mobility  Bed Mobility Overal bed mobility: Modified Independent;Needs Assistance Bed Mobility: Sit to Supine;Supine to Sit     Supine to sit: Modified independent (Device/Increase time) Sit to supine: Min assist   General bed mobility comments: assist to bring R LE into bed  Transfers Overall transfer level: Needs assistance Equipment used: Rolling walker (2 wheeled) Transfers: Sit to/from Stand Sit to Stand: Supervision         General transfer comment: supervision for safety; cues for hand placement  Ambulation/Gait Ambulation/Gait assistance: Supervision Ambulation Distance (Feet): 200 Feet Assistive device: Rolling walker (2 wheeled) Gait Pattern/deviations: Step-through pattern;Decreased stride length Gait velocity: reduced   General Gait Details: slow, steady gait; cues for posture and proximity of RW;    Stairs            Wheelchair Mobility    Modified Rankin (Stroke Patients Only)       Balance                                    Cognition Arousal/Alertness: Lethargic Behavior During Therapy: WFL for tasks  assessed/performed;Flat affect Overall Cognitive Status: Within Functional Limits for tasks assessed                      Exercises Total Joint Exercises Hip ABduction/ADduction: AROM;Right;10 reps Knee Flexion: AROM;Both;10 reps Marching in Standing: AROM;Right;10 reps    General Comments General comments (skin integrity, edema, etc.): pt groggy/lethargic toward end of session and reported dizziness with no nausea       Pertinent Vitals/Pain Pain Assessment: 0-10 Pain Score: 5  Pain Location: R hip with mobility Pain Descriptors / Indicators: Sore Pain Intervention(s): Limited activity within patient's tolerance;Monitored during session;Premedicated before session;Repositioned    Home Living                      Prior Function            PT Goals (current goals can now be found in the care plan section) Acute Rehab PT Goals Patient Stated Goal: go home Progress towards PT goals: Progressing toward goals    Frequency    7X/week      PT Plan Current plan remains appropriate    Co-evaluation             End of Session Equipment Utilized During Treatment: Gait belt Activity Tolerance: Patient tolerated treatment well Patient left: in bed;with call bell/phone within reach     Time: [id number] PT Time Calculation (min) (ACUTE ONLY): 31 min  Charges:  $Gait Training: 8-22 mins $Therapeutic Exercise: 8-22 mins  G Codes:      Derek MoundKellyn R Lexys Milliner Praise Stennett, PTA Pager: [phone]   03/02/2016, 9:42 AM

## 2016-03-02 NOTE — Care Management Note (Signed)
Case Management Note  Patient Details  Name: Abigail Gomez MRN: [id number] Date of Birth: 1965-07-26  Subjective/Objective: 50 yr old female s/p right total hip arthroplasty.                    Action/Plan: Case manager spoke with patient concerning Home Health and DME needs. Patient was preoperatively setup with [facility], no changes. CM has ordered rolling walker and 3in1. She will have family support at discharge.    Expected Discharge Date:   03/02/16               Expected Discharge Plan:  Home w Home Health Services  In-House Referral:  NA  Discharge planning Services  CM Consult  Post Acute Care Choice:  Durable Medical Equipment, Home Health Choice offered to:  Patient  DME Arranged:  3-N-1, Walker rolling DME Agency:  [facility]  HH Arranged:  PT HH Agency[facility]  [facility]  Status of Service:  Completed, signed off  If discussed at [employer] Meetings, dates discussed:    Additional Comments:  Durenda GuthrieBrady, Augusten Lipkin Naomi, RN 03/02/2016, 7:08 AM

## 2016-03-02 NOTE — Care Management (Signed)
Received a call from nurse Danford BadKristie , patient is at home and does not have a bucket for her bedside commode , states when it was delivered to her hospital room there was no bucket. Kristie asking CM to call [facility] and have bucket delivered to 5N nurses station and patient will come pick up. Tamera Standsalled Shannon with [facility] and requested same. They can deliver bucket to nurses station for patient to pick up or mail it to her ( take a couple days) or patient can go to [facility] DME store and pick up.   Spoke diertly to patient. Patient upset , states she has no transportation. Asked Carollee HerterShannon to call patient directly. Patient aware and patient also has [facility]'s number.   Ronny FlurryHeather Vernestine Brodhead RN BSN [phone]

## 2016-03-02 NOTE — Discharge Summary (Signed)
Patient ID: Abigail Gomez MRN: [id number] DOB/AGE: Nov 12, 1965 50 y.o.  Admit date: 02/28/2016 Discharge date: 03/02/2016  Admission Diagnoses:  Principal Problem:   Osteoarthritis of right hip Active Problems:   Status post total replacement of right hip   Discharge Diagnoses:  Same  Past Medical History:  Diagnosis Date  . Allergy   . Anemia   . Anxiety   . Depression   . Disease    lymes disease  . Fibromyalgia   . H/o Lyme disease   . H/O sickle cell trait   . History of pelvic fracture   . History of urinary incontinence   . Hypertension   . Hypothyroidism   . Insomnia   . Labral tear of hip joint    Right Hip  . Migraine   . Osteoarthritis   . RLS (restless legs syndrome)   . Sleep apnea    cpap  . Thyroid disease   . TMJ syndrome   . Vitamin D deficiency     Surgeries: Procedure(s): RIGHT TOTAL HIP ARTHROPLASTY ANTERIOR APPROACH on 02/28/2016   Consultants:   Discharged Condition: Improved  Hospital Course: Abigail Momenticole N [place] is an 50 y.o. female who was admitted 02/28/2016 for operative treatment ofOsteoarthritis of right hip. Patient has severe unremitting pain that affects sleep, daily activities, and work/hobbies. After pre-op clearance the patient was taken to the operating room on 02/28/2016 and underwent  Procedure(s): RIGHT TOTAL HIP ARTHROPLASTY ANTERIOR APPROACH.    Patient was given perioperative antibiotics: Anti-infectives    Start     Dose/Rate Route Frequency Ordered Stop   02/28/16 1715  clindamycin (CLEOCIN) IVPB 600 mg     600 mg 100 mL/hr over 30 Minutes Intravenous Every 6 hours 02/28/16 1707 02/29/16 0514   02/28/16 1014  clindamycin (CLEOCIN) IVPB 900 mg     900 mg 100 mL/hr over 30 Minutes Intravenous On call to O.R. 02/28/16 1014 02/28/16 1215       Patient was given sequential compression devices, early ambulation, and chemoprophylaxis to prevent DVT.  Patient benefited maximally from hospital stay and there were no  complications.    Recent vital signs: Patient Vitals for the past 24 hrs:  BP Temp Temp src Pulse Resp SpO2  03/02/16 0531 (!) 100/57 97.7 F (36.5 C) Oral 64 16 92 %  03/01/16 2105 (!) 99/49 98.6 F (37 C) Oral (!) 59 16 100 %  03/01/16 1153 (!) 88/45 - - (!) 59 - 100 %  03/01/16 1152 (!) 88/45 99 F (37.2 C) Oral (!) 59 16 100 %     Recent laboratory studies:  Recent Labs  02/29/16 0711 03/01/16 0538  WBC 6.5 7.1  HGB 9.9* 8.8*  HCT 28.1* 24.9*  PLT 129* 112*  NA 133*  --   K 4.1  --   CL 99*  --   CO2 28  --   BUN 11  --   CREATININE 0.74  --   GLUCOSE 109*  --   CALCIUM 8.4*  --      Discharge Medications:   Allergies as of 03/02/2016      Reactions   Penicillins Other (See Comments)   Syncope Has patient had a PCN reaction causing immediate rash, facial/tongue/throat swelling, SOB or lightheadedness with hypotension:Yes Has patient had a PCN reaction causing severe rash involving mucus membranes or skin necrosis:No Has patient had a PCN reaction that required hospitalization:No Has patient had a PCN reaction occurring within the last 10 years:No If all  of the above answers are "NO", then may proceed with Cephalosporin use.   Food    Nuts-itching/swelling Bananas-itching/swelling   Lactose Intolerance (gi) Other (See Comments)   G.I. upset   Lisinopril Cough   Percocet [oxycodone-acetaminophen] Itching      Medication List    TAKE these medications   aspirin 81 MG chewable tablet Chew 1 tablet (81 mg total) by mouth 2 (two) times daily.   carisoprodol 350 MG tablet Commonly known as:  SOMA Take 1 tablet (350 mg total) by mouth daily. What changed:  when to take this   cholecalciferol 1000 units tablet Commonly known as:  VITAMIN D Take 1,000 Units by mouth daily.   estradiol 0.075 MG/24HR Commonly known as:  VIVELLE-DOT Place 1 patch onto the skin every other day.   hydrochlorothiazide 12.5 MG tablet Commonly known as:  HYDRODIURIL Take 1  tablet (12.5 mg total) by mouth daily.   hydrOXYzine 25 MG tablet Commonly known as:  ATARAX/VISTARIL Take 50 mg by mouth at bedtime.   lidocaine 5 % Commonly known as:  LIDODERM Place 1 patch onto the skin daily. Remove & Discard patch within 12 hours or as directed by MD What changed:  when to take this  reasons to take this  additional instructions   Magnesium Amino Acid Chelate 20 % Powd Take 450 mg by mouth at bedtime.   MAXALT-MLT 10 MG disintegrating tablet Generic drug:  rizatriptan Take 10 mg by mouth daily as needed for migraine.   Melatonin 10 MG Tabs Take 10 mg by mouth at bedtime.   methocarbamol 500 MG tablet Commonly known as:  ROBAXIN Take 1 tablet (500 mg total) by mouth every 6 (six) hours as needed for muscle spasms.   naproxen sodium 220 MG tablet Commonly known as:  ANAPROX Take 440 mg by mouth 3 (three) times daily as needed (for pain.).   OVER THE COUNTER MEDICATION Take 33.5 mg by mouth daily. Nature Thyroid OTC   oxyCODONE-acetaminophen 5-325 MG tablet Commonly known as:  ROXICET Take 1-2 tablets by mouth every 4 (four) hours as needed.   progesterone 100 MG capsule Commonly known as:  PROMETRIUM Take 100 mg by mouth at bedtime.   propranolol ER 80 MG 24 hr capsule Commonly known as:  INDERAL LA Take 80 mg by mouth daily.   TRINTELLIX 20 MG Tabs Generic drug:  vortioxetine HBr Take 20 mg by mouth daily.   VITAMIN B COMPLEX-C PO Take 1 capsule by mouth daily.   zonisamide 50 MG capsule Commonly known as:  ZONEGRAN Take 150 mg by mouth at bedtime.            Durable Medical Equipment        Start     Ordered   03/02/16 [phone] [id number]  For home use only DME 3 n 1  Once     03/02/16 0707   02/28/16 1708  DME Walker rolling  Once    Question:  Patient needs a walker to treat with the following condition  Answer:  Status post total replacement of right hip   02/28/16 1707      Diagnostic Studies: Dg Pelvis Portable  Result  Date: 02/28/2016 CLINICAL DATA:  Right hip replacement EXAM: PORTABLE PELVIS 1-2 VIEWS COMPARISON:  Earlier same day FINDINGS: Single view shows total hip replacement on the right. Components appear well positioned. No radiographically detectable complication. IMPRESSION: Good appearance following right total hip replacement. Electronically Signed   By: Scherrie BatemanMark  Shogry M.D.  On: 02/28/2016 15:18   Dg C-arm 1-60 Min  Result Date: 02/28/2016 CLINICAL DATA:  Right anterior approach hip jet right prosthesis placement. Fluoro time is reported as 30 seconds. EXAM: OPERATIVE right HIP (WITH PELVIS IF PERFORMED) 4 VIEWS TECHNIQUE: Fluoroscopic spot image(s) were submitted for interpretation post-operatively. COMPARISON:  None in PACs FINDINGS: AP views of the right hip and visualized portions of the pelvis reveal placement of a total joint prosthesis. Radiographic positioning of the prosthetic components is good. The interface of the prosthesis with the native bone appears normal. IMPRESSION: There is no immediate postprocedure complication following anterior approach right hip joint prosthesis placement. Electronically Signed   By: David  [place] M.D.   On: 02/28/2016 14:54   Dg Hip Operative Unilat With Pelvis Right  Result Date: 02/28/2016 CLINICAL DATA:  Right anterior approach hip jet right prosthesis placement. Fluoro time is reported as 30 seconds. EXAM: OPERATIVE right HIP (WITH PELVIS IF PERFORMED) 4 VIEWS TECHNIQUE: Fluoroscopic spot image(s) were submitted for interpretation post-operatively. COMPARISON:  None in PACs FINDINGS: AP views of the right hip and visualized portions of the pelvis reveal placement of a total joint prosthesis. Radiographic positioning of the prosthetic components is good. The interface of the prosthesis with the native bone appears normal. IMPRESSION: There is no immediate postprocedure complication following anterior approach right hip joint prosthesis placement. Electronically  Signed   By: David  [place] M.D.   On: 02/28/2016 14:54    Disposition: 01-Home or Self Care  Discharge Instructions    Call MD / Call 911    Complete by:  As directed    If you experience chest pain or shortness of breath, CALL 911 and be transported to the hospital emergency room.  If you develope a fever above 101 F, pus (white drainage) or increased drainage or redness at the wound, or calf pain, call your surgeon's office.   Constipation Prevention    Complete by:  As directed    Drink plenty of fluids.  Prune juice may be helpful.  You may use a stool softener, such as Colace (over the counter) 100 mg twice a day.  Use MiraLax (over the counter) for constipation as needed.   Diet - low sodium heart healthy    Complete by:  As directed    Discharge patient    Complete by:  As directed    Increase activity slowly as tolerated    Complete by:  As directed       Follow-up Information    Kathryne Hitch, MD Follow up in 2 week(s).   Specialty:  Orthopedic Surgery Contact information: [address] [place] [address] [phone]        [facility] Follow up.   Specialty:  Home Health Services Why:  someone from [facility] will contact you to arrange start date and time for therapy. Contact information: [address] [place] [address] [place] [address] [phone]            Signed: Kathryne Hitch 03/02/2016, 11:41 AM

## 2016-03-03 DIAGNOSIS — Z96641 Presence of right artificial hip joint: Secondary | ICD-10-CM | POA: Diagnosis not present

## 2016-03-03 DIAGNOSIS — M797 Fibromyalgia: Secondary | ICD-10-CM | POA: Diagnosis not present

## 2016-03-03 DIAGNOSIS — G2581 Restless legs syndrome: Secondary | ICD-10-CM | POA: Diagnosis not present

## 2016-03-03 DIAGNOSIS — E785 Hyperlipidemia, unspecified: Secondary | ICD-10-CM | POA: Diagnosis not present

## 2016-03-03 DIAGNOSIS — E559 Vitamin D deficiency, unspecified: Secondary | ICD-10-CM | POA: Diagnosis not present

## 2016-03-03 DIAGNOSIS — F419 Anxiety disorder, unspecified: Secondary | ICD-10-CM | POA: Diagnosis not present

## 2016-03-03 DIAGNOSIS — I10 Essential (primary) hypertension: Secondary | ICD-10-CM | POA: Diagnosis not present

## 2016-03-03 DIAGNOSIS — G4730 Sleep apnea, unspecified: Secondary | ICD-10-CM | POA: Diagnosis not present

## 2016-03-03 DIAGNOSIS — F329 Major depressive disorder, single episode, unspecified: Secondary | ICD-10-CM | POA: Diagnosis not present

## 2016-03-03 DIAGNOSIS — G43909 Migraine, unspecified, not intractable, without status migrainosus: Secondary | ICD-10-CM | POA: Diagnosis not present

## 2016-03-03 DIAGNOSIS — R32 Unspecified urinary incontinence: Secondary | ICD-10-CM | POA: Diagnosis not present

## 2016-03-03 DIAGNOSIS — Z471 Aftercare following joint replacement surgery: Secondary | ICD-10-CM | POA: Diagnosis not present

## 2016-03-03 DIAGNOSIS — M1991 Primary osteoarthritis, unspecified site: Secondary | ICD-10-CM | POA: Diagnosis not present

## 2016-03-07 ENCOUNTER — Telehealth (INDEPENDENT_AMBULATORY_CARE_PROVIDER_SITE_OTHER): Payer: Self-pay | Admitting: *Deleted

## 2016-03-07 DIAGNOSIS — F419 Anxiety disorder, unspecified: Secondary | ICD-10-CM | POA: Diagnosis not present

## 2016-03-07 DIAGNOSIS — R32 Unspecified urinary incontinence: Secondary | ICD-10-CM | POA: Diagnosis not present

## 2016-03-07 DIAGNOSIS — G4730 Sleep apnea, unspecified: Secondary | ICD-10-CM | POA: Diagnosis not present

## 2016-03-07 DIAGNOSIS — E785 Hyperlipidemia, unspecified: Secondary | ICD-10-CM | POA: Diagnosis not present

## 2016-03-07 DIAGNOSIS — M797 Fibromyalgia: Secondary | ICD-10-CM | POA: Diagnosis not present

## 2016-03-07 DIAGNOSIS — E559 Vitamin D deficiency, unspecified: Secondary | ICD-10-CM | POA: Diagnosis not present

## 2016-03-07 DIAGNOSIS — I10 Essential (primary) hypertension: Secondary | ICD-10-CM | POA: Diagnosis not present

## 2016-03-07 DIAGNOSIS — Z471 Aftercare following joint replacement surgery: Secondary | ICD-10-CM | POA: Diagnosis not present

## 2016-03-07 DIAGNOSIS — Z96641 Presence of right artificial hip joint: Secondary | ICD-10-CM | POA: Diagnosis not present

## 2016-03-07 DIAGNOSIS — M1991 Primary osteoarthritis, unspecified site: Secondary | ICD-10-CM | POA: Diagnosis not present

## 2016-03-07 DIAGNOSIS — G2581 Restless legs syndrome: Secondary | ICD-10-CM | POA: Diagnosis not present

## 2016-03-07 DIAGNOSIS — G43909 Migraine, unspecified, not intractable, without status migrainosus: Secondary | ICD-10-CM | POA: Diagnosis not present

## 2016-03-07 DIAGNOSIS — F329 Major depressive disorder, single episode, unspecified: Secondary | ICD-10-CM | POA: Diagnosis not present

## 2016-03-07 NOTE — Telephone Encounter (Signed)
I called spoke Randa EvensJoanne to give verbal ok for home health, patient status post joint replacement 02/28/16

## 2016-03-07 NOTE — Telephone Encounter (Signed)
Randa EvensJoanne from [facility] called for verbal for 1x last week and 2x a week for 3 weeks. Ok to leave VM

## 2016-03-09 ENCOUNTER — Telehealth (INDEPENDENT_AMBULATORY_CARE_PROVIDER_SITE_OTHER): Payer: Self-pay | Admitting: Orthopaedic Surgery

## 2016-03-09 DIAGNOSIS — I10 Essential (primary) hypertension: Secondary | ICD-10-CM | POA: Diagnosis not present

## 2016-03-09 DIAGNOSIS — M797 Fibromyalgia: Secondary | ICD-10-CM | POA: Diagnosis not present

## 2016-03-09 DIAGNOSIS — G43909 Migraine, unspecified, not intractable, without status migrainosus: Secondary | ICD-10-CM | POA: Diagnosis not present

## 2016-03-09 DIAGNOSIS — F329 Major depressive disorder, single episode, unspecified: Secondary | ICD-10-CM | POA: Diagnosis not present

## 2016-03-09 DIAGNOSIS — M1991 Primary osteoarthritis, unspecified site: Secondary | ICD-10-CM | POA: Diagnosis not present

## 2016-03-09 DIAGNOSIS — G4733 Obstructive sleep apnea (adult) (pediatric): Secondary | ICD-10-CM | POA: Diagnosis not present

## 2016-03-09 DIAGNOSIS — E785 Hyperlipidemia, unspecified: Secondary | ICD-10-CM | POA: Diagnosis not present

## 2016-03-09 DIAGNOSIS — Z471 Aftercare following joint replacement surgery: Secondary | ICD-10-CM | POA: Diagnosis not present

## 2016-03-09 DIAGNOSIS — F419 Anxiety disorder, unspecified: Secondary | ICD-10-CM | POA: Diagnosis not present

## 2016-03-09 DIAGNOSIS — G2581 Restless legs syndrome: Secondary | ICD-10-CM | POA: Diagnosis not present

## 2016-03-09 DIAGNOSIS — E559 Vitamin D deficiency, unspecified: Secondary | ICD-10-CM | POA: Diagnosis not present

## 2016-03-09 DIAGNOSIS — R32 Unspecified urinary incontinence: Secondary | ICD-10-CM | POA: Diagnosis not present

## 2016-03-09 DIAGNOSIS — G4730 Sleep apnea, unspecified: Secondary | ICD-10-CM | POA: Diagnosis not present

## 2016-03-09 DIAGNOSIS — Z96641 Presence of right artificial hip joint: Secondary | ICD-10-CM | POA: Diagnosis not present

## 2016-03-09 NOTE — Telephone Encounter (Signed)
Please advise 

## 2016-03-09 NOTE — Telephone Encounter (Signed)
Patient called advised she is having a lot of pain. The Hydrocodone is not working. Patient said she has been trying to tolerate the pain since her surgery on 02/28/16. Patient said the pain is going down her leg and her knee is thobing. Patient said she is not sleeping. The number to contact her is  [phone]

## 2016-03-13 ENCOUNTER — Encounter (INDEPENDENT_AMBULATORY_CARE_PROVIDER_SITE_OTHER): Payer: Self-pay | Admitting: Orthopaedic Surgery

## 2016-03-13 ENCOUNTER — Ambulatory Visit (INDEPENDENT_AMBULATORY_CARE_PROVIDER_SITE_OTHER): Payer: BLUE CROSS/BLUE SHIELD | Admitting: Physician Assistant

## 2016-03-13 ENCOUNTER — Encounter (INDEPENDENT_AMBULATORY_CARE_PROVIDER_SITE_OTHER): Payer: Self-pay

## 2016-03-13 VITALS — Ht 68.0 in | Wt 182.0 lb

## 2016-03-13 DIAGNOSIS — Z96641 Presence of right artificial hip joint: Secondary | ICD-10-CM

## 2016-03-13 MED ORDER — NAPROXEN 500 MG PO TABS: 500.0000 mg | ORAL_TABLET | Freq: Two times a day (BID) | ORAL | 1 refills | Status: DC

## 2016-03-13 MED ORDER — HYDROMORPHONE HCL 2 MG PO TABS: 2.0000 mg | ORAL_TABLET | ORAL | 0 refills | Status: DC | PRN

## 2016-03-13 MED ORDER — GABAPENTIN 100 MG PO CAPS: 300.0000 mg | ORAL_CAPSULE | Freq: Every day | ORAL | 1 refills | Status: DC

## 2016-03-13 NOTE — Progress Notes (Signed)
Office Visit Note   Patient: Abigail Gomez           Date of Birth: 14-Dec-1965           MRN: [id number] Visit Date: 03/13/2016              Requested by: Sheliah HatchKatherine E Tabori, MD [address] [place], [place] [id number] PCP: Neena RhymesKatherine Tabori, MD   Assessment & Plan: Visit Diagnoses: No diagnosis found.  Plan: We will change her pain medicines to DILAUDID. Start her on Neurontin to the burning pain. She states stop her aspirin we'll start her on naproxen also. Scar tissue mobilization encouraged. Continue to work on range motion of the right hip and strengthening. Follow-up in one month sooner if there is any questions or concerns  Follow-Up Instructions: Return in about 4 weeks (around 04/10/2016) for post op.   Orders:  No orders of the defined types were placed in this encounter.  Meds ordered this encounter  Medications  . HYDROmorphone (DILAUDID) 2 MG tablet    Sig: Take 1 tablet (2 mg total) by mouth every 4 (four) hours as needed for severe pain.    Dispense:  90 tablet    Refill:  0  . gabapentin (NEURONTIN) 100 MG capsule    Sig: Take 3 capsules (300 mg total) by mouth at bedtime.    Dispense:  30 capsule    Refill:  1  . naproxen (NAPROSYN) 500 MG tablet    Sig: Take 1 tablet (500 mg total) by mouth 2 (two) times daily with a meal.    Dispense:  60 tablet    Refill:  1      Procedures: No procedures performed   Clinical Data: No additional findings.   Subjective: Chief Complaint  Patient presents with  . Right Hip - Routine Post Op  . Follow-up    HPI  Abigail Gomez returns now 2 weeks status post right total hip arthroplasty. She states her pain medicine is really not helping with the pain in the right hip and right knee pain. Having burning sensation in the hip. Difficulty sleeping secondary to the hip pain  Review of Systems   Objective: Vital Signs: Ht 5\' 8"  (1.727 m)   Wt 182 lb (82.6 kg)   BMI 27.67 kg/m   Physical Exam  Constitutional:  She is oriented to person, place, and time. She appears well-developed and well-nourished. No distress.  Neurological: She is alert and oriented to person, place, and time.    Ortho Exam Right hip surgical incision is healing well no signs of infection. Right calf supple nontender. Good range of motion of the right hip. Specialty Comments:  No specialty comments available.  Imaging: No results found.   PMFS History: Patient Active Problem List   Diagnosis Date Noted  . Hyperlipidemia 02/28/2016  . Osteoarthritis of right hip 02/28/2016  . Status post total replacement of right hip 02/28/2016  . Obstructive sleep apnea syndrome 02/01/2016  . Pain of right hip joint 02/01/2016  . Hypertension 01/31/2016  . Restless leg syndrome 01/31/2016  . Sickle cell trait (HCC) 01/31/2016  . Gastroesophageal reflux 01/31/2016  . History of bunionectomy of both great toes 01/31/2016  . History of pelvic fracture 01/31/2016  . Trochanteric bursitis, right hip 01/26/2016  . Anxiety and depression 09/19/2015  . Fibromyalgia 09/19/2015  . Migraines 09/19/2015  . Hypothyroidism 09/19/2015  . Insomnia 08/20/2015  . Hypersomnia 08/20/2015  . Depression 08/20/2015   Past  Medical History:  Diagnosis Date  . Allergy   . Anemia   . Anxiety   . Depression   . Disease    lymes disease  . Fibromyalgia   . H/o Lyme disease   . H/O sickle cell trait   . History of pelvic fracture   . History of urinary incontinence   . Hypertension   . Hypothyroidism   . Insomnia   . Labral tear of hip joint    Right Hip  . Migraine   . Osteoarthritis   . RLS (restless legs syndrome)   . Sleep apnea    cpap  . Thyroid disease   . TMJ syndrome   . Vitamin D deficiency     Family History  Problem Relation Age of Onset  . Hyperlipidemia Mother   . Diabetes Father   . Hypertension Father   . Cancer Father     prostate  . Diabetes Sister   . Hypertension Sister   . Arthritis Maternal Grandmother     . Stroke Maternal Grandmother   . Hypertension Maternal Grandmother   . Hyperlipidemia Maternal Grandmother   . Hyperkalemia Maternal Grandmother   . Heart disease Maternal Grandmother   . Arthritis Paternal Grandmother   . Diabetes Sister     Past Surgical History:  Procedure Laterality Date  . ABDOMINAL HYSTERECTOMY    . BUNIONECTOMY Bilateral   . chiari malformation    . ENDOMETRIAL ABLATION    . MYOMECTOMY    . OOPHORECTOMY    . TONSILLECTOMY AND ADENOIDECTOMY    . TOTAL HIP ARTHROPLASTY Right 02/28/2016  . TOTAL HIP ARTHROPLASTY Right 02/28/2016   Procedure: RIGHT TOTAL HIP ARTHROPLASTY ANTERIOR APPROACH;  Surgeon: Kathryne Hitch, MD;  Location: [facility] OR;  Service: Orthopedics;  Laterality: Right;   Social History   Occupational History  . Not on file.   Social History Main Topics  . Smoking status: Never Smoker  . Smokeless tobacco: Never Used  . Alcohol use 0.0 oz/week     Comment: occ  . Drug use: No  . Sexual activity: Not on file

## 2016-03-15 DIAGNOSIS — G4730 Sleep apnea, unspecified: Secondary | ICD-10-CM | POA: Diagnosis not present

## 2016-03-15 DIAGNOSIS — G2581 Restless legs syndrome: Secondary | ICD-10-CM | POA: Diagnosis not present

## 2016-03-15 DIAGNOSIS — Z96641 Presence of right artificial hip joint: Secondary | ICD-10-CM | POA: Diagnosis not present

## 2016-03-15 DIAGNOSIS — I10 Essential (primary) hypertension: Secondary | ICD-10-CM | POA: Diagnosis not present

## 2016-03-15 DIAGNOSIS — E785 Hyperlipidemia, unspecified: Secondary | ICD-10-CM | POA: Diagnosis not present

## 2016-03-15 DIAGNOSIS — M797 Fibromyalgia: Secondary | ICD-10-CM | POA: Diagnosis not present

## 2016-03-15 DIAGNOSIS — M1991 Primary osteoarthritis, unspecified site: Secondary | ICD-10-CM | POA: Diagnosis not present

## 2016-03-15 DIAGNOSIS — F329 Major depressive disorder, single episode, unspecified: Secondary | ICD-10-CM | POA: Diagnosis not present

## 2016-03-15 DIAGNOSIS — F419 Anxiety disorder, unspecified: Secondary | ICD-10-CM | POA: Diagnosis not present

## 2016-03-15 DIAGNOSIS — E559 Vitamin D deficiency, unspecified: Secondary | ICD-10-CM | POA: Diagnosis not present

## 2016-03-15 DIAGNOSIS — R32 Unspecified urinary incontinence: Secondary | ICD-10-CM | POA: Diagnosis not present

## 2016-03-15 DIAGNOSIS — Z471 Aftercare following joint replacement surgery: Secondary | ICD-10-CM | POA: Diagnosis not present

## 2016-03-15 DIAGNOSIS — G43909 Migraine, unspecified, not intractable, without status migrainosus: Secondary | ICD-10-CM | POA: Diagnosis not present

## 2016-03-20 DIAGNOSIS — F419 Anxiety disorder, unspecified: Secondary | ICD-10-CM | POA: Diagnosis not present

## 2016-03-20 DIAGNOSIS — E785 Hyperlipidemia, unspecified: Secondary | ICD-10-CM | POA: Diagnosis not present

## 2016-03-20 DIAGNOSIS — R32 Unspecified urinary incontinence: Secondary | ICD-10-CM | POA: Diagnosis not present

## 2016-03-20 DIAGNOSIS — E559 Vitamin D deficiency, unspecified: Secondary | ICD-10-CM | POA: Diagnosis not present

## 2016-03-20 DIAGNOSIS — G43909 Migraine, unspecified, not intractable, without status migrainosus: Secondary | ICD-10-CM | POA: Diagnosis not present

## 2016-03-20 DIAGNOSIS — M797 Fibromyalgia: Secondary | ICD-10-CM | POA: Diagnosis not present

## 2016-03-20 DIAGNOSIS — I10 Essential (primary) hypertension: Secondary | ICD-10-CM | POA: Diagnosis not present

## 2016-03-20 DIAGNOSIS — G2581 Restless legs syndrome: Secondary | ICD-10-CM | POA: Diagnosis not present

## 2016-03-20 DIAGNOSIS — F329 Major depressive disorder, single episode, unspecified: Secondary | ICD-10-CM | POA: Diagnosis not present

## 2016-03-20 DIAGNOSIS — M1991 Primary osteoarthritis, unspecified site: Secondary | ICD-10-CM | POA: Diagnosis not present

## 2016-03-20 DIAGNOSIS — G4730 Sleep apnea, unspecified: Secondary | ICD-10-CM | POA: Diagnosis not present

## 2016-03-20 DIAGNOSIS — Z96641 Presence of right artificial hip joint: Secondary | ICD-10-CM | POA: Diagnosis not present

## 2016-03-20 DIAGNOSIS — Z471 Aftercare following joint replacement surgery: Secondary | ICD-10-CM | POA: Diagnosis not present

## 2016-03-22 DIAGNOSIS — F419 Anxiety disorder, unspecified: Secondary | ICD-10-CM | POA: Diagnosis not present

## 2016-03-22 DIAGNOSIS — Z96641 Presence of right artificial hip joint: Secondary | ICD-10-CM | POA: Diagnosis not present

## 2016-03-22 DIAGNOSIS — F329 Major depressive disorder, single episode, unspecified: Secondary | ICD-10-CM | POA: Diagnosis not present

## 2016-03-22 DIAGNOSIS — G4730 Sleep apnea, unspecified: Secondary | ICD-10-CM | POA: Diagnosis not present

## 2016-03-22 DIAGNOSIS — Z471 Aftercare following joint replacement surgery: Secondary | ICD-10-CM | POA: Diagnosis not present

## 2016-03-22 DIAGNOSIS — M797 Fibromyalgia: Secondary | ICD-10-CM | POA: Diagnosis not present

## 2016-03-22 DIAGNOSIS — E559 Vitamin D deficiency, unspecified: Secondary | ICD-10-CM | POA: Diagnosis not present

## 2016-03-22 DIAGNOSIS — R32 Unspecified urinary incontinence: Secondary | ICD-10-CM | POA: Diagnosis not present

## 2016-03-22 DIAGNOSIS — G43909 Migraine, unspecified, not intractable, without status migrainosus: Secondary | ICD-10-CM | POA: Diagnosis not present

## 2016-03-22 DIAGNOSIS — G2581 Restless legs syndrome: Secondary | ICD-10-CM | POA: Diagnosis not present

## 2016-03-22 DIAGNOSIS — E785 Hyperlipidemia, unspecified: Secondary | ICD-10-CM | POA: Diagnosis not present

## 2016-03-22 DIAGNOSIS — M1991 Primary osteoarthritis, unspecified site: Secondary | ICD-10-CM | POA: Diagnosis not present

## 2016-03-22 DIAGNOSIS — I10 Essential (primary) hypertension: Secondary | ICD-10-CM | POA: Diagnosis not present

## 2016-03-27 ENCOUNTER — Telehealth (INDEPENDENT_AMBULATORY_CARE_PROVIDER_SITE_OTHER): Payer: Self-pay | Admitting: Orthopaedic Surgery

## 2016-03-27 NOTE — Telephone Encounter (Signed)
Please advise 

## 2016-03-27 NOTE — Telephone Encounter (Signed)
Pt calling to ask if we could increase her dosage of  gabapentin as she cannot bare the pain from surgery, she stated the last time she requested that no one called her back to advise   [phone]

## 2016-03-27 NOTE — Telephone Encounter (Signed)
Please call in neurontin 300 mg to take up to 3 times daily.  She can take 600mg  for her evening dose. #90. Thanks

## 2016-03-27 NOTE — Telephone Encounter (Signed)
LMOM for patient that I called this in for her.

## 2016-04-09 DIAGNOSIS — G4733 Obstructive sleep apnea (adult) (pediatric): Secondary | ICD-10-CM | POA: Diagnosis not present

## 2016-04-10 ENCOUNTER — Ambulatory Visit (INDEPENDENT_AMBULATORY_CARE_PROVIDER_SITE_OTHER): Payer: BLUE CROSS/BLUE SHIELD | Admitting: Orthopaedic Surgery

## 2016-04-10 ENCOUNTER — Encounter (INDEPENDENT_AMBULATORY_CARE_PROVIDER_SITE_OTHER): Payer: Self-pay | Admitting: Orthopaedic Surgery

## 2016-04-10 DIAGNOSIS — Z96641 Presence of right artificial hip joint: Secondary | ICD-10-CM

## 2016-04-10 MED ORDER — HYDROMORPHONE HCL 2 MG PO TABS: 2.0000 mg | ORAL_TABLET | Freq: Two times a day (BID) | ORAL | 0 refills | Status: DC | PRN

## 2016-04-10 NOTE — Progress Notes (Signed)
The patient is now 6 weeks status post a right total hip arthroplasty through direct injury approach. She is struggling with pain and mobility. The some of this is secondary to her fibromyalgia but also some of this is secondary to her young age as well as the cold weather. She understands this fully. She is trying all diseased can do to increase her mobility and decrease her pain.  On examination she tolerates me easily putting her right hip through internal/external rotation.  At this point I would like to put her into outpatient physical therapy to hopefully increase the strength of her right hip and decrease her pain. This can also help with balance coordination and mobility. I did refill her.lauded today. She is also taking Neurontin. We'll see her back in 4 weeks to see how she doing overall but no x-rays are needed.

## 2016-04-16 DIAGNOSIS — M25551 Pain in right hip: Secondary | ICD-10-CM | POA: Diagnosis not present

## 2016-04-19 DIAGNOSIS — M25551 Pain in right hip: Secondary | ICD-10-CM | POA: Diagnosis not present

## 2016-04-24 DIAGNOSIS — M25551 Pain in right hip: Secondary | ICD-10-CM | POA: Diagnosis not present

## 2016-04-26 DIAGNOSIS — M25551 Pain in right hip: Secondary | ICD-10-CM | POA: Diagnosis not present

## 2016-05-04 DIAGNOSIS — M25551 Pain in right hip: Secondary | ICD-10-CM | POA: Diagnosis not present

## 2016-05-07 DIAGNOSIS — M25551 Pain in right hip: Secondary | ICD-10-CM | POA: Diagnosis not present

## 2016-05-08 ENCOUNTER — Ambulatory Visit (INDEPENDENT_AMBULATORY_CARE_PROVIDER_SITE_OTHER): Payer: BLUE CROSS/BLUE SHIELD | Admitting: Orthopaedic Surgery

## 2016-05-08 ENCOUNTER — Encounter (INDEPENDENT_AMBULATORY_CARE_PROVIDER_SITE_OTHER): Payer: Self-pay

## 2016-05-09 DIAGNOSIS — G4733 Obstructive sleep apnea (adult) (pediatric): Secondary | ICD-10-CM | POA: Diagnosis not present

## 2016-05-10 ENCOUNTER — Ambulatory Visit (INDEPENDENT_AMBULATORY_CARE_PROVIDER_SITE_OTHER): Payer: BLUE CROSS/BLUE SHIELD | Admitting: Orthopaedic Surgery

## 2016-05-10 DIAGNOSIS — M25551 Pain in right hip: Secondary | ICD-10-CM | POA: Diagnosis not present

## 2016-05-10 DIAGNOSIS — Z96641 Presence of right artificial hip joint: Secondary | ICD-10-CM

## 2016-05-10 NOTE — Progress Notes (Signed)
The patient is now about 9 weeks post a right total hip replacement. 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 She is making progress. She is still having pain.  On examination she has much better range of motion of her right hip but her hip flexors are still weak. She has decreased reaction time with pushing down with that leg.  She is making progress though. I would like see her back in 3 weeks see how she doing overall. I did give her no reflecting the fact that I will really only and comfortable with her driving short distances and mainly between her house and food as well as doctors appointments. She is continuing her cane. I'll see her back in 3 weeks see how she doing overall. No x-rays are needed. She does report that Neurontin has helped her quite a bit

## 2016-05-14 ENCOUNTER — Telehealth (INDEPENDENT_AMBULATORY_CARE_PROVIDER_SITE_OTHER): Payer: Self-pay | Admitting: Orthopaedic Surgery

## 2016-05-14 DIAGNOSIS — M25551 Pain in right hip: Secondary | ICD-10-CM | POA: Diagnosis not present

## 2016-05-14 NOTE — Telephone Encounter (Signed)
PATIENT CAME BY OFFICE AND COMPLETED RELEASE FORM FOR HER LAST OV NOTE 05/10/2016 BE FAXED TO [employer] FOR HER FMLA AS TODAY IS THE DEADLINE FOR IT TO RECEIVED. I FAXED TO [phone] AND CLAIM NUMBER ON FAX COVER

## 2016-05-15 DIAGNOSIS — G4733 Obstructive sleep apnea (adult) (pediatric): Secondary | ICD-10-CM | POA: Diagnosis not present

## 2016-05-16 ENCOUNTER — Other Ambulatory Visit (INDEPENDENT_AMBULATORY_CARE_PROVIDER_SITE_OTHER): Payer: Self-pay | Admitting: Orthopaedic Surgery

## 2016-05-16 NOTE — Telephone Encounter (Signed)
Please advise 

## 2016-05-17 DIAGNOSIS — M25551 Pain in right hip: Secondary | ICD-10-CM | POA: Diagnosis not present

## 2016-05-23 DIAGNOSIS — M25551 Pain in right hip: Secondary | ICD-10-CM | POA: Diagnosis not present

## 2016-05-29 DIAGNOSIS — M25551 Pain in right hip: Secondary | ICD-10-CM | POA: Diagnosis not present

## 2016-05-31 ENCOUNTER — Other Ambulatory Visit: Payer: Self-pay | Admitting: General Practice

## 2016-05-31 ENCOUNTER — Encounter (INDEPENDENT_AMBULATORY_CARE_PROVIDER_SITE_OTHER): Payer: Self-pay | Admitting: Orthopaedic Surgery

## 2016-05-31 ENCOUNTER — Ambulatory Visit (INDEPENDENT_AMBULATORY_CARE_PROVIDER_SITE_OTHER): Payer: BLUE CROSS/BLUE SHIELD | Admitting: Orthopaedic Surgery

## 2016-05-31 DIAGNOSIS — M25551 Pain in right hip: Secondary | ICD-10-CM | POA: Diagnosis not present

## 2016-05-31 DIAGNOSIS — Z96641 Presence of right artificial hip joint: Secondary | ICD-10-CM

## 2016-05-31 MED ORDER — HYDROCHLOROTHIAZIDE 12.5 MG PO TABS: 12.5000 mg | ORAL_TABLET | Freq: Every day | ORAL | 0 refills | Status: DC

## 2016-05-31 NOTE — Progress Notes (Signed)
The patient is now 3 months status post a right total hip arthroplasty through direct anterior approach. She's having problems with flareup of her fibromyalgia. She is and living with a cane still. She is been getting in the pool for aquatic therapy and trying an elliptical as well. 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 She plans to go back to work 06/10/2016. I feel comfortable with her doing that except for I would like her to only drive about 4 hours daily for the first 4 weeks.  Her ligaments are equal. I can easily put her right hip to internal and external rotation with no problems with this.  At this point she'll continue slowly increase her activities. I like see her back in 3 months. I will like a low AP pelvis and a lateral of her right hip at that visit.

## 2016-06-04 DIAGNOSIS — M25551 Pain in right hip: Secondary | ICD-10-CM | POA: Diagnosis not present

## 2016-06-11 DIAGNOSIS — R269 Unspecified abnormalities of gait and mobility: Secondary | ICD-10-CM | POA: Diagnosis not present

## 2016-06-12 ENCOUNTER — Other Ambulatory Visit (INDEPENDENT_AMBULATORY_CARE_PROVIDER_SITE_OTHER): Payer: Self-pay | Admitting: Orthopaedic Surgery

## 2016-06-12 NOTE — Telephone Encounter (Signed)
Please advise 

## 2016-06-15 DIAGNOSIS — R269 Unspecified abnormalities of gait and mobility: Secondary | ICD-10-CM | POA: Diagnosis not present

## 2016-06-19 DIAGNOSIS — R269 Unspecified abnormalities of gait and mobility: Secondary | ICD-10-CM | POA: Diagnosis not present

## 2016-06-26 DIAGNOSIS — R269 Unspecified abnormalities of gait and mobility: Secondary | ICD-10-CM | POA: Diagnosis not present

## 2016-07-04 ENCOUNTER — Ambulatory Visit (INDEPENDENT_AMBULATORY_CARE_PROVIDER_SITE_OTHER): Payer: BLUE CROSS/BLUE SHIELD | Admitting: Family Medicine

## 2016-07-04 ENCOUNTER — Encounter: Payer: Self-pay | Admitting: Family Medicine

## 2016-07-04 VITALS — BP 138/82 | HR 56 | Resp 17 | Ht 68.0 in | Wt 183.0 lb

## 2016-07-04 DIAGNOSIS — I10 Essential (primary) hypertension: Secondary | ICD-10-CM | POA: Diagnosis not present

## 2016-07-04 MED ORDER — LOSARTAN POTASSIUM-HCTZ 50-12.5 MG PO TABS: 1.0000 | ORAL_TABLET | Freq: Every day | ORAL | 1 refills | Status: DC

## 2016-07-04 MED ORDER — LOSARTAN POTASSIUM-HCTZ 50-12.5 MG PO TABS: 1.0000 | ORAL_TABLET | Freq: Every day | ORAL | 3 refills | Status: DC

## 2016-07-04 NOTE — Progress Notes (Signed)
Pre visit review using our clinic review tool, if applicable. No additional management support is needed unless otherwise documented below in the visit note. 

## 2016-07-04 NOTE — Patient Instructions (Signed)
Follow up in 1 month to recheck BP STOP the HCTZ daily START the Losartan HCTZ daily for BP Continue to work on healthy diet and regular exercise- this will improve your pressure Call with any questions or concerns Hang in there!!!

## 2016-07-04 NOTE — Addendum Note (Signed)
Addended by: Yvone Neu L on: 07/04/2016 11:05 AM   Modules accepted: Orders

## 2016-07-04 NOTE — Progress Notes (Signed)
   Subjective:    Patient ID: [name], female    DOB: 04/24/1965, 51 y.o.   MRN: [id number]  HPI HTN- chronic problem, on HCTZ daily.  Pt has been seeing Dr Verl Dicker office and was told that since BP was not elevated at those visits 'there was nothing to do'.  Pt reports at night BP will 'spike to 140-150/90-100'.  Increased migraine headaches recently.     Review of Systems For ROS see HPI     Objective:   Physical Exam  Constitutional: She is oriented to person, place, and time. She appears well-developed and well-nourished. No distress.  HENT:  Head: Normocephalic and atraumatic.  Eyes: Conjunctivae and EOM are normal. Pupils are equal, round, and reactive to light.  Neck: Normal range of motion. Neck supple. No thyromegaly present.  Cardiovascular: Normal rate, regular rhythm, normal heart sounds and intact distal pulses.   No murmur heard. Pulmonary/Chest: Effort normal and breath sounds normal. No respiratory distress.  Abdominal: Soft. She exhibits no distension. There is no tenderness.  Musculoskeletal: She exhibits no edema.  Lymphadenopathy:    She has no cervical adenopathy.  Neurological: She is alert and oriented to person, place, and time.  Skin: Skin is warm and dry.  Psychiatric: She has a normal mood and affect. Her behavior is normal.  Vitals reviewed.         Assessment & Plan:

## 2016-07-04 NOTE — Assessment & Plan Note (Signed)
Deteriorated.  Pt reports home BPs are running 140-150s/90-100s and she is having increased HAs.  Would like better BP control.  Start Losartan in addition to HCTZ and Propranolol.  Will continue to follow.

## 2016-07-05 DIAGNOSIS — R269 Unspecified abnormalities of gait and mobility: Secondary | ICD-10-CM | POA: Diagnosis not present

## 2016-07-06 ENCOUNTER — Telehealth (INDEPENDENT_AMBULATORY_CARE_PROVIDER_SITE_OTHER): Payer: Self-pay | Admitting: Orthopaedic Surgery

## 2016-07-06 NOTE — Telephone Encounter (Signed)
Pt requested an extension for her work restrictions as well as a refill of Robaxin please.  [phone]

## 2016-07-09 ENCOUNTER — Encounter (INDEPENDENT_AMBULATORY_CARE_PROVIDER_SITE_OTHER): Payer: Self-pay

## 2016-07-09 MED ORDER — METHOCARBAMOL 500 MG PO TABS: 500.0000 mg | ORAL_TABLET | Freq: Four times a day (QID) | ORAL | 0 refills | Status: DC | PRN

## 2016-07-09 NOTE — Telephone Encounter (Signed)
I sent in the robaxin.  OK to extend her work note for one more month.  Have it state to continue her current work restrictions for four more weeks. Thanks

## 2016-07-09 NOTE — Telephone Encounter (Signed)
Please advise 

## 2016-07-09 NOTE — Telephone Encounter (Signed)
Can you do me a favor and print this note/letter out and put it at front desk, she is coming by this morning to get it, and we are in [facility]

## 2016-07-12 ENCOUNTER — Other Ambulatory Visit (INDEPENDENT_AMBULATORY_CARE_PROVIDER_SITE_OTHER): Payer: Self-pay | Admitting: Physician Assistant

## 2016-07-13 ENCOUNTER — Telehealth (INDEPENDENT_AMBULATORY_CARE_PROVIDER_SITE_OTHER): Payer: Self-pay | Admitting: Radiology

## 2016-07-13 NOTE — Telephone Encounter (Signed)
Patient called concerned that she was told she needed to sign another release for her info to be sent to disability.  I have pulled form from epic and filled it out and sent it in for her.  Just FYI to you so you know.  See me with any questions. Thanks!!

## 2016-07-16 DIAGNOSIS — R269 Unspecified abnormalities of gait and mobility: Secondary | ICD-10-CM | POA: Diagnosis not present

## 2016-07-16 NOTE — Telephone Encounter (Signed)
Thank you :)

## 2016-07-20 NOTE — Progress Notes (Deleted)
Office Visit Note  Patient: Abigail Gomez             Date of Birth: [birth year]           MRN: [id number]             PCP: Sheliah Hatch, MD Referring: Sheliah Hatch, MD Visit Date: 07/31/2016 Occupation: @GU[employer] @    Subjective:  No chief complaint on file.   History of Present Illness: Abigail Gomez is a 51 y.o. female ***   Activities of Daily Living:  Patient reports morning stiffness for *** {minute/hour:19697}.   Patient {ACTIONS;DENIES/REPORTS:21021675::"Denies"} nocturnal pain.  Difficulty dressing/grooming: {ACTIONS;DENIES/REPORTS:21021675::"Denies"} Difficulty climbing stairs: {ACTIONS;DENIES/REPORTS:21021675::"Denies"} Difficulty getting out of chair: {ACTIONS;DENIES/REPORTS:21021675::"Denies"} Difficulty using hands for taps, buttons, cutlery, and/or writing: {ACTIONS;DENIES/REPORTS:21021675::"Denies"}   No Rheumatology ROS completed.   PMFS History:  Patient Active Problem List   Diagnosis Date Noted  . Hyperlipidemia 02/28/2016  . Osteoarthritis of right hip 02/28/2016  . Status post total replacement of right hip 02/28/2016  . Obstructive sleep apnea syndrome 02/01/2016  . Pain of right hip joint 02/01/2016  . Hypertension 01/31/2016  . Restless leg syndrome 01/31/2016  . Sickle cell trait (HCC) 01/31/2016  . Gastroesophageal reflux 01/31/2016  . History of bunionectomy of both great toes 01/31/2016  . History of pelvic fracture 01/31/2016  . Trochanteric bursitis, right hip 01/26/2016  . Anxiety and depression 09/19/2015  . Fibromyalgia 09/19/2015  . Migraines 09/19/2015  . Hypothyroidism 09/19/2015  . Insomnia 08/20/2015  . Hypersomnia 08/20/2015  . Depression 08/20/2015    Past Medical History:  Diagnosis Date  . Allergy   . Anemia   . Anxiety   . Depression   . Disease    lymes disease  . Fibromyalgia   . H/o Lyme disease   . H/O sickle cell trait   . History of pelvic fracture   . History of urinary incontinence   .  Hypertension   . Hypothyroidism   . Insomnia   . Labral tear of hip joint    Right Hip  . Migraine   . Osteoarthritis   . RLS (restless legs syndrome)   . Sleep apnea    cpap  . Thyroid disease   . TMJ syndrome   . Vitamin D deficiency     Family History  Problem Relation Age of Onset  . Hyperlipidemia Mother   . Diabetes Father   . Hypertension Father   . Cancer Father        prostate  . Diabetes Sister   . Hypertension Sister   . Arthritis Maternal Grandmother   . Stroke Maternal Grandmother   . Hypertension Maternal Grandmother   . Hyperlipidemia Maternal Grandmother   . Hyperkalemia Maternal Grandmother   . Heart disease Maternal Grandmother   . Arthritis Paternal Grandmother   . Diabetes Sister    Past Surgical History:  Procedure Laterality Date  . ABDOMINAL HYSTERECTOMY    . BUNIONECTOMY Bilateral   . chiari malformation    . ENDOMETRIAL ABLATION    . MYOMECTOMY    . OOPHORECTOMY    . TONSILLECTOMY AND ADENOIDECTOMY    . TOTAL HIP ARTHROPLASTY Right 02/28/2016  . TOTAL HIP ARTHROPLASTY Right 02/28/2016   Procedure: RIGHT TOTAL HIP ARTHROPLASTY ANTERIOR APPROACH;  Surgeon: Kathryne Hitch, MD;  Location: [facility] OR;  Service: Orthopedics;  Laterality: Right;   Social History   Social History Narrative  . No narrative on file     Objective: Vital Signs: There  were no vitals taken for this visit.   Physical Exam   Musculoskeletal Exam: ***  CDAI Exam: No CDAI exam completed.    Investigation: No additional findings. CBC Latest Ref Rng & Units 03/01/2016 02/29/2016 02/27/2016  WBC 4.0 - 10.5 K/uL 7.1 6.5 5.1  Hemoglobin 12.0 - 15.0 g/dL 1.6(X8.8(L) 0.9(U9.9(L) 04.512.7  Hematocrit 36.0 - 46.0 % 24.9(L) 28.1(L) 37.0  Platelets 150 - 400 K/uL 112(L) 129(L) 161    CMP Latest Ref Rng & Units 02/29/2016 02/28/2016 02/27/2016  Glucose 65 - 99 mg/dL 409(W109(H) - 99  BUN 6 - 20 mg/dL 11 - 10  Creatinine 1.190.44 - 1.00 mg/dL 1.470.74 - 8.290.82  Sodium 562135 - 145 mmol/L  133(L) - 139  Potassium 3.5 - 5.1 mmol/L 4.1 - 4.3  Chloride 101 - 111 mmol/L 99(L) - 103  CO2 22 - 32 mmol/L 28 - 31  Calcium 8.9 - 10.3 mg/dL 1.3(Y8.4(L) - 9.5  Total Protein 6.0 - 8.3 g/dL - 6.3 -  Total Bilirubin 0.2 - 1.2 mg/dL - 0.6 -  Alkaline Phos 39 - 117 U/L - 74 -  AST 0 - 37 U/L - 22 -  ALT 0 - 35 U/L - 28 -    Imaging: No results found.  Speciality Comments: No specialty comments available.    Procedures:  No procedures performed Allergies: Penicillins; Food; Lactose intolerance (gi); Lisinopril; and Percocet [oxycodone-acetaminophen]   Assessment / Plan:     Visit Diagnoses: Fibromyalgia  Trochanteric bursitis, right hip  Primary osteoarthritis of right hip  Primary insomnia  Hypersomnia  Anxiety and depression  Restless leg syndrome  Sickle cell trait (HCC)  History of bunionectomy of both great toes  History of pelvic fracture  Pain of right hip joint  Status post total replacement of right hip  History of hyperlipidemia    Orders: No orders of the defined types were placed in this encounter.  No orders of the defined types were placed in this encounter.   Face-to-face time spent with patient was *** minutes. 50% of time was spent in counseling and coordination of care.  Follow-Up Instructions: No Follow-up on file.   Aamna Mallozzi, RT  Note - This record has been created using [employer] software.  Chart creation errors have been sought, but may not always  have been located. Such creation errors do not reflect on  the standard of medical care.

## 2016-07-31 ENCOUNTER — Ambulatory Visit: Payer: BLUE CROSS/BLUE SHIELD | Admitting: Rheumatology

## 2016-08-01 ENCOUNTER — Ambulatory Visit: Payer: BLUE CROSS/BLUE SHIELD | Admitting: Family Medicine

## 2016-08-07 DIAGNOSIS — R635 Abnormal weight gain: Secondary | ICD-10-CM | POA: Diagnosis not present

## 2016-08-07 DIAGNOSIS — K5904 Chronic idiopathic constipation: Secondary | ICD-10-CM | POA: Diagnosis not present

## 2016-08-08 ENCOUNTER — Encounter: Payer: Self-pay | Admitting: General Practice

## 2016-08-08 ENCOUNTER — Ambulatory Visit (INDEPENDENT_AMBULATORY_CARE_PROVIDER_SITE_OTHER): Payer: BLUE CROSS/BLUE SHIELD | Admitting: Family Medicine

## 2016-08-08 ENCOUNTER — Encounter: Payer: Self-pay | Admitting: Family Medicine

## 2016-08-08 VITALS — BP 131/81 | HR 47 | Resp 16 | Ht 68.0 in | Wt 187.0 lb

## 2016-08-08 DIAGNOSIS — I10 Essential (primary) hypertension: Secondary | ICD-10-CM | POA: Diagnosis not present

## 2016-08-08 DIAGNOSIS — Z96641 Presence of right artificial hip joint: Secondary | ICD-10-CM | POA: Diagnosis not present

## 2016-08-08 LAB — BASIC METABOLIC PANEL
BUN: 10 mg/dL (ref 6–23)
CALCIUM: 9.3 mg/dL (ref 8.4–10.5)
CO2: 32 meq/L (ref 19–32)
CREATININE: 0.9 mg/dL (ref 0.40–1.20)
Chloride: 103 mEq/L (ref 96–112)
GFR: 85.63 mL/min (ref >60.00)
Glucose, Bld: 96 mg/dL (ref 70–99)
Potassium: 4.4 mEq/L (ref 3.5–5.1)
Sodium: 138 mEq/L (ref 135–145)

## 2016-08-08 NOTE — Assessment & Plan Note (Signed)
Handicapped form completed for pt due to chronic back and hip pain.

## 2016-08-08 NOTE — Assessment & Plan Note (Signed)
Chronic problem.  Better control today.  Asymptomatic.  Repeat BMP to assess renal fxn w/ addition of ARB.  No anticipated med changes.  Will follow.

## 2016-08-08 NOTE — Progress Notes (Signed)
Pre visit review using our clinic review tool, if applicable. No additional management support is needed unless otherwise documented below in the visit note. 

## 2016-08-08 NOTE — Progress Notes (Signed)
   Subjective:    Patient ID: Trellis MomentNicole N Vetrano, female    DOB: September 22, 1965, 51 y.o.   MRN: [id number]  HPI HTN- chronic problem.  At last visit, we switched HCTZ for Losartan HCTZ.  SBP has dropped 7 points today.  I suspect that her BP would be better but she is having severe back pain today.  Denies CP, SOB, HAs, visual changes, edema.  Pt reports home BPs have been 'pretty good'- 118/80 was most recent   Review of Systems For ROS see HPI     Objective:   Physical Exam  Constitutional: She is oriented to person, place, and time. She appears well-developed and well-nourished. No distress.  HENT:  Head: Normocephalic and atraumatic.  Eyes: Conjunctivae and EOM are normal. Pupils are equal, round, and reactive to light.  Neck: Normal range of motion. Neck supple. No thyromegaly present.  Cardiovascular: Normal rate, regular rhythm, normal heart sounds and intact distal pulses.   No murmur heard. Pulmonary/Chest: Effort normal and breath sounds normal. No respiratory distress.  Abdominal: Soft. She exhibits no distension. There is no tenderness.  Musculoskeletal: She exhibits no edema.  Lymphadenopathy:    She has no cervical adenopathy.  Neurological: She is alert and oriented to person, place, and time.  Skin: Skin is warm and dry.  Psychiatric: She has a normal mood and affect. Her behavior is normal.  Vitals reviewed.         Assessment & Plan:

## 2016-08-08 NOTE — Patient Instructions (Signed)
Schedule your complete physical in 3-4 months We'll notify you of your lab results and make any changes if needed Continue the Losartan HCTZ daily Call with any questions or concerns Have a great summer!!!

## 2016-08-09 ENCOUNTER — Other Ambulatory Visit: Payer: Self-pay | Admitting: General Practice

## 2016-08-09 DIAGNOSIS — M25551 Pain in right hip: Secondary | ICD-10-CM | POA: Diagnosis not present

## 2016-08-09 MED ORDER — LOSARTAN POTASSIUM-HCTZ 50-12.5 MG PO TABS: 1.0000 | ORAL_TABLET | Freq: Every day | ORAL | 1 refills | Status: DC

## 2016-08-17 ENCOUNTER — Other Ambulatory Visit: Payer: Self-pay | Admitting: Family Medicine

## 2016-08-23 ENCOUNTER — Ambulatory Visit (INDEPENDENT_AMBULATORY_CARE_PROVIDER_SITE_OTHER): Payer: BLUE CROSS/BLUE SHIELD | Admitting: Family Medicine

## 2016-08-23 ENCOUNTER — Encounter: Payer: Self-pay | Admitting: Family Medicine

## 2016-08-23 VITALS — BP 110/70 | HR 47 | Temp 98.1°F | Resp 16 | Ht 68.0 in | Wt 184.0 lb

## 2016-08-23 DIAGNOSIS — J0100 Acute maxillary sinusitis, unspecified: Secondary | ICD-10-CM

## 2016-08-23 DIAGNOSIS — G4733 Obstructive sleep apnea (adult) (pediatric): Secondary | ICD-10-CM | POA: Diagnosis not present

## 2016-08-23 MED ORDER — CLARITHROMYCIN 500 MG PO TABS: 500.0000 mg | ORAL_TABLET | Freq: Two times a day (BID) | ORAL | 0 refills | Status: DC

## 2016-08-23 MED ORDER — PROMETHAZINE-DM 6.25-15 MG/5ML PO SYRP: 5.0000 mL | ORAL_SOLUTION | Freq: Four times a day (QID) | ORAL | 0 refills | Status: DC | PRN

## 2016-08-23 NOTE — Progress Notes (Signed)
Office Visit Note  Patient: Abigail Gomez             Date of Birth: 10-Sep-1965           MRN: [id number]             PCP: Sheliah Hatch, MD Referring: Sheliah Hatch, MD Visit Date: 08/24/2016 Occupation: @GUAROCC @    Subjective:  Fibromyalgia (FOLLOW UP)   History of Present Illness: Abigail Gomez is a 51 y.o. female with history of fibromyalgia and osteoarthritis. She had right total hip replacement in December 2017. She is still recovering from that. She has difficulty getting in and out of the car. She's having discomfort in her left hip as well. She has follow-up with Dr. Rayburn Ma in July. She is off and on discomfort in her knee joints. She's been having frequent flares of fibromyalgia. She rates her fibromyalgia pain on a scale of 0-10 about 7-8, fatigue is at 7 as well. She currently has a sinus infection. She is on Biaxin currently.  Activities of Daily Living:  Patient reports morning stiffness for 30 minutes.   Patient Reports nocturnal pain.  Difficulty dressing/grooming: Reports Difficulty climbing stairs: Reports Difficulty getting out of chair: Reports Difficulty using hands for taps, buttons, cutlery, and/or writing: Denies   Review of Systems  Constitutional: Positive for fatigue. Negative for night sweats, weight gain, weight loss and weakness.  HENT: Positive for mouth dryness. Negative for mouth sores, trouble swallowing, trouble swallowing and nose dryness.   Eyes: Positive for dryness. Negative for pain, redness and visual disturbance.  Respiratory: Positive for cough. Negative for shortness of breath and difficulty breathing.   Cardiovascular: Negative for chest pain, palpitations, hypertension, irregular heartbeat and swelling in legs/feet.  Gastrointestinal: Negative for blood in stool, constipation and diarrhea.  Endocrine: Negative for increased urination.  Genitourinary: Negative for vaginal dryness.  Musculoskeletal: Positive for  arthralgias, joint pain, myalgias, morning stiffness and myalgias. Negative for joint swelling, muscle weakness and muscle tenderness.  Skin: Negative for color change, rash, hair loss, skin tightness, ulcers and sensitivity to sunlight.  Allergic/Immunologic: Negative for susceptible to infections.  Neurological: Negative for dizziness, memory loss and night sweats.  Hematological: Negative for swollen glands.  Psychiatric/Behavioral: Positive for depressed mood and sleep disturbance. The patient is nervous/anxious.     PMFS History:  Patient Active Problem List   Diagnosis Date Noted  . Hyperlipidemia 02/28/2016  . Osteoarthritis of right hip 02/28/2016  . Status post total replacement of right hip 02/28/2016  . Obstructive sleep apnea syndrome 02/01/2016  . Pain of right hip joint 02/01/2016  . Hypertension 01/31/2016  . Restless leg syndrome 01/31/2016  . Sickle cell trait (HCC) 01/31/2016  . Gastroesophageal reflux 01/31/2016  . History of bunionectomy of both great toes 01/31/2016  . History of pelvic fracture 01/31/2016  . Trochanteric bursitis, right hip 01/26/2016  . Anxiety and depression 09/19/2015  . Fibromyalgia 09/19/2015  . Migraines 09/19/2015  . Hypothyroidism 09/19/2015  . Insomnia 08/20/2015  . Hypersomnia 08/20/2015  . Depression 08/20/2015    Past Medical History:  Diagnosis Date  . Allergy   . Anemia   . Anxiety   . Depression   . Disease    lymes disease  . Fibromyalgia   . H/o Lyme disease   . H/O sickle cell trait   . History of pelvic fracture   . History of urinary incontinence   . Hypertension   . Hypothyroidism   . Insomnia   .  Labral tear of hip joint    Right Hip  . Migraine   . Osteoarthritis   . RLS (restless legs syndrome)   . Sleep apnea    cpap  . Thyroid disease   . TMJ syndrome   . Vitamin D deficiency     Family History  Problem Relation Age of Onset  . Hyperlipidemia Mother   . Diabetes Father   . Hypertension  Father   . Cancer Father        prostate  . Diabetes Sister   . Hypertension Sister   . Arthritis Maternal Grandmother   . Stroke Maternal Grandmother   . Hypertension Maternal Grandmother   . Hyperlipidemia Maternal Grandmother   . Hyperkalemia Maternal Grandmother   . Heart disease Maternal Grandmother   . Arthritis Paternal Grandmother   . Diabetes Sister    Past Surgical History:  Procedure Laterality Date  . ABDOMINAL HYSTERECTOMY    . BUNIONECTOMY Bilateral   . chiari malformation    . ENDOMETRIAL ABLATION    . MYOMECTOMY    . OOPHORECTOMY    . TONSILLECTOMY AND ADENOIDECTOMY    . TOTAL HIP ARTHROPLASTY Right 02/28/2016  . TOTAL HIP ARTHROPLASTY Right 02/28/2016   Procedure: RIGHT TOTAL HIP ARTHROPLASTY ANTERIOR APPROACH;  Surgeon: Kathryne Hitch, MD;  Location: [facility] OR;  Service: Orthopedics;  Laterality: Right;   Social History   Social History Narrative  . No narrative on file     Objective: Vital Signs: BP 128/72 (BP Location: Left Arm, Patient Position: Sitting, Cuff Size: Normal)   Pulse (!) 56   Resp 14   Ht 5\' 8"  (1.727 m)   Wt 187 lb (84.8 kg)   BMI 28.43 kg/m    Physical Exam  Constitutional: She is oriented to person, place, and time. She appears well-developed and well-nourished.  HENT:  Head: Normocephalic and atraumatic.  Eyes: Conjunctivae and EOM are normal.  Neck: Normal range of motion.  Cardiovascular: Normal rate, regular rhythm, normal heart sounds and intact distal pulses.   Pulmonary/Chest: Effort normal and breath sounds normal.  Abdominal: Soft. Bowel sounds are normal.  Lymphadenopathy:    She has no cervical adenopathy.  Neurological: She is alert and oriented to person, place, and time.  Skin: Skin is warm and dry. Capillary refill takes less than 2 seconds.  Psychiatric: She has a normal mood and affect. Her behavior is normal.  Nursing note and vitals reviewed.    Musculoskeletal Exam: C-spine and thoracic lumbar  spine good range of motion. She is some discomfort range of motion of lumbar spine. Shoulder joints elbow joints wrist joints MCPs PIPs DIPs with good range of motion. No synovitis was noted. She has painful range of motion of her right hip joint and some discomfort range of motion of her left hip joint. Knee joints ankle joints MTPs PIPs DIPs are good range of motion with no synovitis. Fibromyalgia tender points were 18 out of 18 positive.  CDAI Exam: No CDAI exam completed.    Investigation: No additional findings. CBC Latest Ref Rng & Units 03/01/2016 02/29/2016 02/27/2016  WBC 4.0 - 10.5 K/uL 7.1 6.5 5.1  Hemoglobin 12.0 - 15.0 g/dL 1.6(X) 0.9(U) 04.5  Hematocrit 36.0 - 46.0 % 24.9(L) 28.1(L) 37.0  Platelets 150 - 400 K/uL 112(L) 129(L) 161    CMP Latest Ref Rng & Units 08/08/2016 02/29/2016 02/28/2016  Glucose 70 - 99 mg/dL 96 409(W) -  BUN 6 - 23 mg/dL 10 11 -  Creatinine  0.40 - 1.20 mg/dL 4.090.90 8.110.74 -  Sodium 914135 - 145 mEq/L 138 133(L) -  Potassium 3.5 - 5.1 mEq/L 4.4 4.1 -  Chloride 96 - 112 mEq/L 103 99(L) -  CO2 19 - 32 mEq/L 32 28 -  Calcium 8.4 - 10.5 mg/dL 9.3 7.8(G8.4(L) -  Total Protein 6.0 - 8.3 g/dL - - 6.3  Total Bilirubin 0.2 - 1.2 mg/dL - - 0.6  Alkaline Phos 39 - 117 U/L - - 74  AST 0 - 37 U/L - - 22  ALT 0 - 35 U/L - - 28   Imaging: No results found.  Speciality Comments: No specialty comments available.    Procedures:  No procedures performed Allergies: Penicillins; Food; Lactose intolerance (gi); Lisinopril; and Percocet [oxycodone-acetaminophen]   Assessment / Plan:     Visit Diagnoses: Status post total replacement of right hip: She had right hip replacement in December 2017. She continues to have ongoing pain in her right hip. She's also complaining of left hip pain. She'll follow-up with Dr. Rayburn MaBlackmon closely.  History of bunionectomy of both great toes: Not much discomfort  Fibromyalgia: She continues to have lower of pain and discomfort. She states  she's having a flare of fibromyalgia generalized pain. Need for regular exercise and good sleep hygiene was discussed. She requested a refill for Lidoderm patch was also given. There is nothing else to add our offer. I've advised her to follow-up with her PCP. She can return for follow-up on when necessary basis.  Primary insomnia - On CPAP  Hypersomnia  History of pelvic fracture  Restless leg syndrome  History of migraine  History of anxiety and depression: She is seeing psychiatrist and has been started on new medication.  Sickle cell trait (HCC)  History of hypertension - Followed up by Dr. Jacinto HalimGanji  History of gastroesophageal reflux (GERD)    Orders: No orders of the defined types were placed in this encounter.  Meds ordered this encounter  Medications  . lidocaine (LIDODERM) 5 %    Sig: Place 1 patch onto the skin daily. Remove & Discard patch within 12 hours or as directed by MD    Dispense:  90 patch    Refill:  1    Face-to-face time spent with patient was 30 minutes. 50% of time was spent in counseling and coordination of care.  Follow-Up Instructions: Return if symptoms worsen or fail to improve, for FMS, OA.   Pollyann SavoyShaili Dvaughn Fickle, MD  Note - This record has been created using Animal nutritionistDragon software.  Chart creation errors have been sought, but may not always  have been located. Such creation errors do not reflect on  the standard of medical care.

## 2016-08-23 NOTE — Patient Instructions (Signed)
Follow up as needed Start the Clarithromycin twice daily- take w/ food Use Mucinex DM for daytime cough and congestion Cough syrup for nights and weekends- may cause drowsiness Drink plenty of fluids REST! Call with any questions or concerns Hang in there!!!

## 2016-08-23 NOTE — Progress Notes (Signed)
   Subjective:    Patient ID: Abigail Gomez, female    DOB: 02/22/1966, 51 y.o.   MRN: [id number]  HPI URI- 'i have a sinus infection'.  + upper tooth pain, nasal congestion, facial pain.  Nasal drainage is yellow-green, + cough, fatigue.  No fevers, + night sweats.  + sick contacts.  sxs started ~2 weeks ago.   Review of Systems For ROS see HPI     Objective:   Physical Exam  Constitutional: She appears well-developed and well-nourished. No distress.  HENT:  Head: Normocephalic and atraumatic.  Right Ear: Tympanic membrane normal.  Left Ear: Tympanic membrane normal.  Nose: Mucosal edema and rhinorrhea present. Right sinus exhibits maxillary sinus tenderness and frontal sinus tenderness. Left sinus exhibits maxillary sinus tenderness and frontal sinus tenderness.  Mouth/Throat: Uvula is midline and mucous membranes are normal. Posterior oropharyngeal erythema present. No oropharyngeal exudate.  Eyes: Conjunctivae and EOM are normal. Pupils are equal, round, and reactive to light.  Neck: Normal range of motion. Neck supple.  Cardiovascular: Normal rate, regular rhythm and normal heart sounds.   Pulmonary/Chest: Effort normal and breath sounds normal. No respiratory distress. She has no wheezes.  Lymphadenopathy:    She has no cervical adenopathy.  Vitals reviewed.         Assessment & Plan:  Maxillary sinusitis- new.  Pt's sxs and PE consistent w/ infxn.  Start abx- will start Biaxin due to PCN allergy.  Cough meds prn.  Reviewed supportive care and red flags that should prompt return. Pt expressed understanding and is in agreement w/ plan.

## 2016-08-23 NOTE — Progress Notes (Signed)
Pre visit review using our clinic review tool, if applicable. No additional management support is needed unless otherwise documented below in the visit note. 

## 2016-08-24 ENCOUNTER — Encounter: Payer: Self-pay | Admitting: Rheumatology

## 2016-08-24 ENCOUNTER — Ambulatory Visit (INDEPENDENT_AMBULATORY_CARE_PROVIDER_SITE_OTHER): Payer: BLUE CROSS/BLUE SHIELD | Admitting: Rheumatology

## 2016-08-24 VITALS — BP 128/72 | HR 56 | Resp 14 | Ht 68.0 in | Wt 187.0 lb

## 2016-08-24 DIAGNOSIS — Z8659 Personal history of other mental and behavioral disorders: Secondary | ICD-10-CM | POA: Diagnosis not present

## 2016-08-24 DIAGNOSIS — Z96641 Presence of right artificial hip joint: Secondary | ICD-10-CM

## 2016-08-24 DIAGNOSIS — G2581 Restless legs syndrome: Secondary | ICD-10-CM

## 2016-08-24 DIAGNOSIS — G4710 Hypersomnia, unspecified: Secondary | ICD-10-CM | POA: Diagnosis not present

## 2016-08-24 DIAGNOSIS — Z9889 Other specified postprocedural states: Secondary | ICD-10-CM | POA: Diagnosis not present

## 2016-08-24 DIAGNOSIS — Z8781 Personal history of (healed) traumatic fracture: Secondary | ICD-10-CM

## 2016-08-24 DIAGNOSIS — D573 Sickle-cell trait: Secondary | ICD-10-CM | POA: Diagnosis not present

## 2016-08-24 DIAGNOSIS — M797 Fibromyalgia: Secondary | ICD-10-CM | POA: Diagnosis not present

## 2016-08-24 DIAGNOSIS — Z8679 Personal history of other diseases of the circulatory system: Secondary | ICD-10-CM | POA: Diagnosis not present

## 2016-08-24 DIAGNOSIS — F5101 Primary insomnia: Secondary | ICD-10-CM

## 2016-08-24 DIAGNOSIS — Z8669 Personal history of other diseases of the nervous system and sense organs: Secondary | ICD-10-CM | POA: Diagnosis not present

## 2016-08-24 DIAGNOSIS — Z8719 Personal history of other diseases of the digestive system: Secondary | ICD-10-CM | POA: Diagnosis not present

## 2016-08-24 MED ORDER — LIDOCAINE 5 % EX PTCH: 1.0000 | MEDICATED_PATCH | CUTANEOUS | 1 refills | Status: DC

## 2016-08-27 ENCOUNTER — Telehealth: Payer: Self-pay | Admitting: Family Medicine

## 2016-08-27 ENCOUNTER — Encounter: Payer: Self-pay | Admitting: General Practice

## 2016-08-27 NOTE — Telephone Encounter (Signed)
Called pt and LMOVM to find out what her email address is. Letter m placed in billing spt in the back for front desk to send to pt.

## 2016-08-27 NOTE — Telephone Encounter (Signed)
FYI, e-mail is in pt chart.

## 2016-08-27 NOTE — Telephone Encounter (Signed)
Ok for note keeping pt out of work until Wednesday.  Hoarseness improves w/ vocal rest, plenty of fluids, hot tea, and time.  The anti-inflammatories that she is taking will also help

## 2016-08-27 NOTE — Telephone Encounter (Signed)
Please advise. Ok for note? 

## 2016-08-27 NOTE — Telephone Encounter (Signed)
Pt states that she is feeling a little better and is just now getting her voice back,but is still very horse and asking what can she do to improve that, also pt states that she did not go to work today nor will she be going tomorrow and need a work note e-mailed to her so that she can give it to her employer when she returns to work on [employer].

## 2016-08-30 ENCOUNTER — Telehealth: Payer: Self-pay

## 2016-08-30 NOTE — Telephone Encounter (Signed)
Patient would like a copy of her last office mailed.

## 2016-08-30 NOTE — Telephone Encounter (Signed)
Patient will need to request records through medical records.

## 2016-08-31 ENCOUNTER — Ambulatory Visit (INDEPENDENT_AMBULATORY_CARE_PROVIDER_SITE_OTHER): Payer: BLUE CROSS/BLUE SHIELD | Admitting: Family Medicine

## 2016-08-31 ENCOUNTER — Telehealth: Payer: Self-pay | Admitting: Family Medicine

## 2016-08-31 ENCOUNTER — Encounter: Payer: Self-pay | Admitting: Family Medicine

## 2016-08-31 ENCOUNTER — Encounter: Payer: Self-pay | Admitting: General Practice

## 2016-08-31 VITALS — BP 124/76 | HR 57 | Temp 97.9°F | Resp 16 | Ht 68.0 in | Wt 186.0 lb

## 2016-08-31 DIAGNOSIS — I10 Essential (primary) hypertension: Secondary | ICD-10-CM

## 2016-08-31 DIAGNOSIS — Z0000 Encounter for general adult medical examination without abnormal findings: Secondary | ICD-10-CM

## 2016-08-31 DIAGNOSIS — Z8619 Personal history of other infectious and parasitic diseases: Secondary | ICD-10-CM

## 2016-08-31 LAB — CBC WITH DIFFERENTIAL/PLATELET
BASOS PCT: 0.7 % (ref 0.0–3.0)
Basophils Absolute: 0 10*3/uL (ref 0.0–0.1)
EOS PCT: 1 % (ref 0.0–5.0)
Eosinophils Absolute: 0 10*3/uL (ref 0.0–0.7)
HCT: 32.3 % — ABNORMAL LOW (ref 36.0–46.0)
Hemoglobin: 10.9 g/dL — ABNORMAL LOW (ref 12.0–15.0)
Lymphocytes Relative: 49.3 % — ABNORMAL HIGH (ref 12.0–46.0)
Lymphs Abs: 2.3 10*3/uL (ref 0.7–4.0)
MCHC: 33.9 g/dL (ref 30.0–36.0)
MCV: 82.3 fl (ref 78.0–100.0)
MONOS PCT: 9.8 % (ref 3.0–12.0)
Monocytes Absolute: 0.5 10*3/uL (ref 0.1–1.0)
NEUTROS PCT: 39.2 % — AB (ref 43.0–77.0)
Neutro Abs: 1.8 10*3/uL (ref 1.4–7.7)
Platelets: 183 10*3/uL (ref 150.0–400.0)
RBC: 3.93 Mil/uL (ref 3.87–5.11)
RDW: 15.8 % — AB (ref 11.5–15.5)
WBC: 4.7 10*3/uL (ref 4.0–10.5)

## 2016-08-31 LAB — BASIC METABOLIC PANEL
BUN: 11 mg/dL (ref 6–23)
CHLORIDE: 106 meq/L (ref 96–112)
CO2: 29 mEq/L (ref 19–32)
Calcium: 8.8 mg/dL (ref 8.4–10.5)
Creatinine, Ser: 0.91 mg/dL (ref 0.40–1.20)
GFR: 84.52 mL/min (ref >60.00)
Glucose, Bld: 104 mg/dL — ABNORMAL HIGH (ref 70–99)
POTASSIUM: 4.5 meq/L (ref 3.5–5.1)
SODIUM: 141 meq/L (ref 135–145)

## 2016-08-31 LAB — HEPATIC FUNCTION PANEL
ALBUMIN: 3.8 g/dL (ref 3.5–5.2)
ALK PHOS: 77 U/L (ref 39–117)
ALT: 13 U/L (ref 0–35)
AST: 20 U/L (ref 0–37)
BILIRUBIN DIRECT: 0.1 mg/dL (ref 0.0–0.3)
BILIRUBIN TOTAL: 0.3 mg/dL (ref 0.2–1.2)
Total Protein: 5.8 g/dL — ABNORMAL LOW (ref 6.0–8.3)

## 2016-08-31 LAB — LIPID PANEL
CHOL/HDL RATIO: 3
Cholesterol: 149 mg/dL (ref 0–200)
HDL: 49 mg/dL (ref >39.00)
LDL CALC: 90 mg/dL (ref 0–99)
NONHDL: 99.59
Triglycerides: 50 mg/dL (ref 0.0–149.0)
VLDL: 10 mg/dL (ref 0.0–40.0)

## 2016-08-31 LAB — TSH: TSH: 2.04 u[IU]/mL (ref 0.35–4.50)

## 2016-08-31 NOTE — Telephone Encounter (Signed)
Ok for referral?

## 2016-08-31 NOTE — Telephone Encounter (Signed)
Caller name: Relationship to patient: Self Can be reached: [phone]  Pharmacy:  Reason for call: Request a referral to Dr. Juluis MirePat Triplett @ [facility] Infectious Disease (Lyme Disease) [address] [place], [place] [address]  [phone]

## 2016-08-31 NOTE — Progress Notes (Signed)
Pre visit review using our clinic review tool, if applicable. No additional management support is needed unless otherwise documented below in the visit note. 

## 2016-08-31 NOTE — Patient Instructions (Signed)
Follow up in 6 months to recheck BP We'll notify you of your lab results and make any changes if needed Continue to work on healthy diet and regular exercise- you can do it! Call with any questions or concerns Have a great weekend!

## 2016-08-31 NOTE — Assessment & Plan Note (Signed)
Chronic problem.  Well controlled today.  Asymptomatic.  Check labs.  No anticipated med changes.  Will follow. 

## 2016-08-31 NOTE — Assessment & Plan Note (Signed)
Pt's PE WNL w/ exception of being overweight.  UTD on GYN, immunizations.  Check labs.  Anticipatory guidance provided.  

## 2016-08-31 NOTE — Telephone Encounter (Signed)
This referral was placed today. Just wanted to let you know that it is to an external office.

## 2016-08-31 NOTE — Progress Notes (Signed)
   Subjective:    Patient ID: Abigail Gomez, female    DOB: 1965/11/07, 51 y.o.   MRN: [id number]  HPI CPE- UTD on GYN (Grewal), Immunizations (due for Tdap next year)   Review of Systems Patient reports no vision/ hearing changes, adenopathy,fever, weight change,  persistant/recurrent hoarseness , swallowing issues, chest pain, palpitations, edema, persistant/recurrent cough, hemoptysis, dyspnea (rest/exertional/paroxysmal nocturnal), gastrointestinal bleeding (melena, rectal bleeding), abdominal pain, significant heartburn, bowel changes, GU symptoms (dysuria, hematuria, incontinence), Gyn symptoms (abnormal  bleeding, pain),  syncope, focal weakness, memory loss, skin/hair/nail changes, abnormal bruising or bleeding, anxiety, or depression.   + numbness of feet    Objective:   Physical Exam General Appearance:    Alert, cooperative, no distress, appears stated age  Head:    Normocephalic, without obvious abnormality, atraumatic  Eyes:    PERRL, conjunctiva/corneas clear, EOM's intact, fundi    benign, both eyes  Ears:    Normal TM's and external ear canals, both ears  Nose:   Nares normal, septum midline, mucosa normal, no drainage    or sinus tenderness  Throat:   Lips, mucosa, and tongue normal; teeth and gums normal  Neck:   Supple, symmetrical, trachea midline, no adenopathy;    Thyroid: no enlargement/tenderness/nodules  Back:     Symmetric, no curvature, ROM normal, no CVA tenderness  Lungs:     Clear to auscultation bilaterally, respirations unlabored  Chest Wall:    No tenderness or deformity   Heart:    Regular rate and rhythm, S1 and S2 normal, no murmur, rub   or gallop  Breast Exam:    Deferred to GYN  Abdomen:     Soft, non-tender, bowel sounds active all four quadrants,    no masses, no organomegaly  Genitalia:    Deferred to GYN  Rectal:    Extremities:   Extremities normal, atraumatic, no cyanosis or edema  Pulses:   2+ and symmetric all extremities  Skin:    Skin color, texture, turgor normal, no rashes or lesions  Lymph nodes:   Cervical, supraclavicular, and axillary nodes normal  Neurologic:   CNII-XII intact, normal strength, sensation and reflexes    throughout          Assessment & Plan:

## 2016-08-31 NOTE — Telephone Encounter (Signed)
Talked with patient and advised her of message concerning medical records.  Please is aware.  Thank You.

## 2016-08-31 NOTE — Telephone Encounter (Signed)
Ok for referral (dx hx of lyme disease)

## 2016-09-05 ENCOUNTER — Telehealth (INDEPENDENT_AMBULATORY_CARE_PROVIDER_SITE_OTHER): Payer: Self-pay | Admitting: Rheumatology

## 2016-09-05 NOTE — Telephone Encounter (Signed)
Copy of records ready at front desk °

## 2016-09-05 NOTE — Telephone Encounter (Signed)
Please advise 

## 2016-09-05 NOTE — Telephone Encounter (Signed)
We have not treated her for this.  I don't have any office notes to send.

## 2016-09-05 NOTE — Telephone Encounter (Signed)
Received faxed from [facility] stating that they need office notes and labs on pt for lyme disease. Is this something that we treated? I am not finding any office note on this from [facility]

## 2016-09-07 NOTE — Telephone Encounter (Signed)
LM advising pt that we did not treat her for lyme disease and she would need to get records to [facility] before an appt could be scheduled.

## 2016-09-11 DIAGNOSIS — F411 Generalized anxiety disorder: Secondary | ICD-10-CM | POA: Diagnosis not present

## 2016-09-11 DIAGNOSIS — M545 Low back pain: Secondary | ICD-10-CM | POA: Diagnosis not present

## 2016-09-11 DIAGNOSIS — M797 Fibromyalgia: Secondary | ICD-10-CM | POA: Diagnosis not present

## 2016-09-11 DIAGNOSIS — G4709 Other insomnia: Secondary | ICD-10-CM | POA: Diagnosis not present

## 2016-09-11 DIAGNOSIS — F605 Obsessive-compulsive personality disorder: Secondary | ICD-10-CM | POA: Diagnosis not present

## 2016-09-11 DIAGNOSIS — F330 Major depressive disorder, recurrent, mild: Secondary | ICD-10-CM | POA: Diagnosis not present

## 2016-09-13 DIAGNOSIS — M797 Fibromyalgia: Secondary | ICD-10-CM | POA: Diagnosis not present

## 2016-09-13 DIAGNOSIS — M545 Low back pain: Secondary | ICD-10-CM | POA: Diagnosis not present

## 2016-09-13 DIAGNOSIS — G4709 Other insomnia: Secondary | ICD-10-CM | POA: Diagnosis not present

## 2016-09-13 DIAGNOSIS — F411 Generalized anxiety disorder: Secondary | ICD-10-CM | POA: Diagnosis not present

## 2016-09-18 ENCOUNTER — Telehealth: Payer: Self-pay | Admitting: Family Medicine

## 2016-09-18 NOTE — Telephone Encounter (Signed)
Pt states that she started an abx a few weeks ago and has now developed a yeast infection and asking if diflucan can be called in for her, [facility] [address]

## 2016-09-19 MED ORDER — FLUCONAZOLE 150 MG PO TABS: 150.0000 mg | ORAL_TABLET | Freq: Once | ORAL | 0 refills | Status: AC

## 2016-09-19 NOTE — Telephone Encounter (Signed)
Medication filled to pharmacy as requested.  Pt informed.   

## 2016-09-19 NOTE — Telephone Encounter (Signed)
Ok for Diflucan 150mg x1 dose 

## 2016-09-19 NOTE — Telephone Encounter (Signed)
Please advise 

## 2016-09-20 DIAGNOSIS — M797 Fibromyalgia: Secondary | ICD-10-CM | POA: Diagnosis not present

## 2016-09-20 DIAGNOSIS — M545 Low back pain: Secondary | ICD-10-CM | POA: Diagnosis not present

## 2016-09-20 DIAGNOSIS — F411 Generalized anxiety disorder: Secondary | ICD-10-CM | POA: Diagnosis not present

## 2016-09-20 DIAGNOSIS — G4709 Other insomnia: Secondary | ICD-10-CM | POA: Diagnosis not present

## 2016-10-01 ENCOUNTER — Other Ambulatory Visit (INDEPENDENT_AMBULATORY_CARE_PROVIDER_SITE_OTHER): Payer: Self-pay

## 2016-10-01 ENCOUNTER — Ambulatory Visit (INDEPENDENT_AMBULATORY_CARE_PROVIDER_SITE_OTHER): Payer: BLUE CROSS/BLUE SHIELD | Admitting: Orthopaedic Surgery

## 2016-10-01 ENCOUNTER — Ambulatory Visit (INDEPENDENT_AMBULATORY_CARE_PROVIDER_SITE_OTHER): Payer: Self-pay

## 2016-10-01 DIAGNOSIS — M25552 Pain in left hip: Secondary | ICD-10-CM | POA: Diagnosis not present

## 2016-10-01 DIAGNOSIS — R5381 Other malaise: Secondary | ICD-10-CM | POA: Diagnosis not present

## 2016-10-01 DIAGNOSIS — M2550 Pain in unspecified joint: Secondary | ICD-10-CM | POA: Diagnosis not present

## 2016-10-01 DIAGNOSIS — Z96641 Presence of right artificial hip joint: Secondary | ICD-10-CM | POA: Diagnosis not present

## 2016-10-01 DIAGNOSIS — M549 Dorsalgia, unspecified: Secondary | ICD-10-CM | POA: Diagnosis not present

## 2016-10-01 DIAGNOSIS — G909 Disorder of the autonomic nervous system, unspecified: Secondary | ICD-10-CM | POA: Diagnosis not present

## 2016-10-01 DIAGNOSIS — R5383 Other fatigue: Secondary | ICD-10-CM | POA: Diagnosis not present

## 2016-10-01 NOTE — Progress Notes (Signed)
The patient is well-known to me. She is between 7 and 8 months months out from a right total hip arthroplasty through direct anterior approach. She still having considerable problems of the right hip. This is now affected her SI joints and her sciatica. She's having significant left hip and groin pain as well. The right hip disease was found on MRI arthrogram that showed significant labral tearing and arthritic findings. She was seen by hip arthroscopist who recommended arthroplasty instead. She is in need of continued physical therapy and [facility] a [facility] therapy outfit in town but also deals with fibromyalgia. I do feel though her right hip pain is definitely related to her surgery and it would be medically necessary in my opinion for see further physical therapy on her back and her hips to help. I am concerned about the pain she is getting in her left hip and the locking and catching.  X-rays are obtained of her pelvis and hips today. The left hip appears normal plain films but the right hip did prior to us obtaining an MRI of the right hip finding a significant labral tear and arthritic changes. The hip x-rays today on the right side show well-seated implant with no complicating features.  She has significant pain with internal/external rotation of both hips.  At this point I do feel is warranted to obtain an MR arthrogram of her left hip to rule out a labral tear given the locking catching in her hip and a worsening groin pain. I also feel is medically necessary would continue her in extensive physical therapy to help her balance and coordination and decreasing her pain. All questions were encouraged and answered. I'll see her back once the MRI arthrograms attained to assess the cartilage and labrum of her left hip.

## 2016-10-02 ENCOUNTER — Other Ambulatory Visit (INDEPENDENT_AMBULATORY_CARE_PROVIDER_SITE_OTHER): Payer: Self-pay

## 2016-10-02 DIAGNOSIS — M25552 Pain in left hip: Secondary | ICD-10-CM

## 2016-10-09 ENCOUNTER — Telehealth (INDEPENDENT_AMBULATORY_CARE_PROVIDER_SITE_OTHER): Payer: Self-pay | Admitting: Orthopaedic Surgery

## 2016-10-09 DIAGNOSIS — M545 Low back pain: Secondary | ICD-10-CM | POA: Diagnosis not present

## 2016-10-09 DIAGNOSIS — F411 Generalized anxiety disorder: Secondary | ICD-10-CM | POA: Diagnosis not present

## 2016-10-09 DIAGNOSIS — M797 Fibromyalgia: Secondary | ICD-10-CM | POA: Diagnosis not present

## 2016-10-09 DIAGNOSIS — G4709 Other insomnia: Secondary | ICD-10-CM | POA: Diagnosis not present

## 2016-10-09 NOTE — Telephone Encounter (Signed)
Patient called asking for a muscle relaxer and a pain reliever so she can sleep at night and work without pain. CB # [phone]

## 2016-10-09 NOTE — Telephone Encounter (Signed)
Please advise 

## 2016-10-10 ENCOUNTER — Other Ambulatory Visit (INDEPENDENT_AMBULATORY_CARE_PROVIDER_SITE_OTHER): Payer: Self-pay

## 2016-10-10 ENCOUNTER — Other Ambulatory Visit (INDEPENDENT_AMBULATORY_CARE_PROVIDER_SITE_OTHER): Payer: Self-pay | Admitting: Orthopaedic Surgery

## 2016-10-10 MED ORDER — ACETAMINOPHEN-CODEINE #3 300-30 MG PO TABS: 1.0000 | ORAL_TABLET | Freq: Three times a day (TID) | ORAL | 0 refills | Status: DC | PRN

## 2016-10-10 MED ORDER — TIZANIDINE HCL 4 MG PO TABS: 4.0000 mg | ORAL_TABLET | Freq: Two times a day (BID) | ORAL | 0 refills | Status: DC | PRN

## 2016-10-10 NOTE — Telephone Encounter (Signed)
Called into pharmacy, patient aware 

## 2016-10-10 NOTE — Telephone Encounter (Signed)
Asked her if she would rather have tramadol or Tylenol No. 3 because these are the only medications at this point we can call in for pain. Either one would be 1-2 every 8 hours as needed for pain #60 with no refills. Also she can have Zanaflex 4 mg twice daily as needed for spasms #60 with no refills.

## 2016-10-11 DIAGNOSIS — G4709 Other insomnia: Secondary | ICD-10-CM | POA: Diagnosis not present

## 2016-10-11 DIAGNOSIS — M545 Low back pain: Secondary | ICD-10-CM | POA: Diagnosis not present

## 2016-10-11 DIAGNOSIS — M797 Fibromyalgia: Secondary | ICD-10-CM | POA: Diagnosis not present

## 2016-10-11 DIAGNOSIS — F411 Generalized anxiety disorder: Secondary | ICD-10-CM | POA: Diagnosis not present

## 2016-10-15 ENCOUNTER — Telehealth (INDEPENDENT_AMBULATORY_CARE_PROVIDER_SITE_OTHER): Payer: Self-pay | Admitting: *Deleted

## 2016-10-15 NOTE — Telephone Encounter (Signed)
Pt called not very happy wanting to know why she has not heard about scheduling MRI arthrogram of her hips, she states she is suppose to have MRI of R hip wo contrast and MRI L hip with contrast arthrogram and also an MRI of her LSP without contrast. I explained to pt there was nothing written about MRI of all three and that there was mention of L hip, she states she had spoken with Dr. Magnus IvanBlackman about her pain in all three areas and wants something done about it. I informed pt that I will send this message to his assistant and see if he agrees with the MRI of all three areas and if so then he will sign off on them and will get her scheduled. Pt is not very happy with our services but agrees will wait to hear back from someone on the procedures.   Please advise.

## 2016-10-16 DIAGNOSIS — F411 Generalized anxiety disorder: Secondary | ICD-10-CM | POA: Diagnosis not present

## 2016-10-16 DIAGNOSIS — G4709 Other insomnia: Secondary | ICD-10-CM | POA: Diagnosis not present

## 2016-10-16 DIAGNOSIS — M545 Low back pain: Secondary | ICD-10-CM | POA: Diagnosis not present

## 2016-10-16 DIAGNOSIS — M797 Fibromyalgia: Secondary | ICD-10-CM | POA: Diagnosis not present

## 2016-10-16 NOTE — Telephone Encounter (Signed)
Please get an MRI of her pelvis to include a MRI arthrogram of the right hip. Also get a MRI of her lumbar spine due to radicular pain down her left leg rule out HNP as the source

## 2016-10-16 NOTE — Telephone Encounter (Signed)
Want to MRI all, or just Hip as the note says?

## 2016-10-17 ENCOUNTER — Other Ambulatory Visit (INDEPENDENT_AMBULATORY_CARE_PROVIDER_SITE_OTHER): Payer: Self-pay

## 2016-10-17 DIAGNOSIS — M4807 Spinal stenosis, lumbosacral region: Secondary | ICD-10-CM

## 2016-10-17 DIAGNOSIS — M62830 Muscle spasm of back: Secondary | ICD-10-CM | POA: Diagnosis not present

## 2016-10-17 DIAGNOSIS — M5431 Sciatica, right side: Secondary | ICD-10-CM | POA: Diagnosis not present

## 2016-10-17 DIAGNOSIS — M9904 Segmental and somatic dysfunction of sacral region: Secondary | ICD-10-CM | POA: Diagnosis not present

## 2016-10-17 DIAGNOSIS — M5441 Lumbago with sciatica, right side: Secondary | ICD-10-CM | POA: Diagnosis not present

## 2016-10-17 NOTE — Telephone Encounter (Signed)
He wants both hips I believe  Just arthrogram on the right hip

## 2016-10-17 NOTE — Telephone Encounter (Signed)
It would have to be MRI hip w contrast arthrogram unless he is just wanting the hip. But should be fine with just hip.

## 2016-10-17 NOTE — Telephone Encounter (Signed)
See below, can we do this?  I will put in additional order for her back

## 2016-10-18 DIAGNOSIS — M5431 Sciatica, right side: Secondary | ICD-10-CM | POA: Diagnosis not present

## 2016-10-18 DIAGNOSIS — F411 Generalized anxiety disorder: Secondary | ICD-10-CM | POA: Diagnosis not present

## 2016-10-18 DIAGNOSIS — M797 Fibromyalgia: Secondary | ICD-10-CM | POA: Diagnosis not present

## 2016-10-18 DIAGNOSIS — M62830 Muscle spasm of back: Secondary | ICD-10-CM | POA: Diagnosis not present

## 2016-10-18 DIAGNOSIS — M545 Low back pain: Secondary | ICD-10-CM | POA: Diagnosis not present

## 2016-10-18 DIAGNOSIS — M5441 Lumbago with sciatica, right side: Secondary | ICD-10-CM | POA: Diagnosis not present

## 2016-10-18 DIAGNOSIS — G4709 Other insomnia: Secondary | ICD-10-CM | POA: Diagnosis not present

## 2016-10-18 DIAGNOSIS — M9904 Segmental and somatic dysfunction of sacral region: Secondary | ICD-10-CM | POA: Diagnosis not present

## 2016-10-22 ENCOUNTER — Encounter (HOSPITAL_COMMUNITY): Payer: Self-pay | Admitting: Nurse Practitioner

## 2016-10-22 ENCOUNTER — Emergency Department (HOSPITAL_COMMUNITY)
Admission: EM | Admit: 2016-10-22 | Discharge: 2016-10-22 | Disposition: A | Discharge status: Home / Self Care | Payer: BLUE CROSS/BLUE SHIELD | Attending: Emergency Medicine | Admitting: Emergency Medicine

## 2016-10-22 ENCOUNTER — Ambulatory Visit (INDEPENDENT_AMBULATORY_CARE_PROVIDER_SITE_OTHER): Payer: BLUE CROSS/BLUE SHIELD | Admitting: Orthopaedic Surgery

## 2016-10-22 ENCOUNTER — Other Ambulatory Visit (INDEPENDENT_AMBULATORY_CARE_PROVIDER_SITE_OTHER): Payer: Self-pay | Admitting: Orthopaedic Surgery

## 2016-10-22 DIAGNOSIS — M25551 Pain in right hip: Secondary | ICD-10-CM | POA: Insufficient documentation

## 2016-10-22 DIAGNOSIS — Z79899 Other long term (current) drug therapy: Secondary | ICD-10-CM | POA: Diagnosis not present

## 2016-10-22 DIAGNOSIS — G8929 Other chronic pain: Secondary | ICD-10-CM

## 2016-10-22 DIAGNOSIS — Z96641 Presence of right artificial hip joint: Secondary | ICD-10-CM | POA: Insufficient documentation

## 2016-10-22 DIAGNOSIS — I10 Essential (primary) hypertension: Secondary | ICD-10-CM | POA: Diagnosis not present

## 2016-10-22 DIAGNOSIS — M25552 Pain in left hip: Secondary | ICD-10-CM

## 2016-10-22 DIAGNOSIS — E039 Hypothyroidism, unspecified: Secondary | ICD-10-CM | POA: Insufficient documentation

## 2016-10-22 DIAGNOSIS — M545 Low back pain: Secondary | ICD-10-CM | POA: Insufficient documentation

## 2016-10-22 NOTE — ED Triage Notes (Signed)
Pt presents today with c/o hip and back pain. Her pain is chronic. She is being seen by Dr [facility] at [facility] for her hip and back pain but she came here today because she can not manage her pain at home and she does not want to follow up with Dr Rayburn MaBlackmon any longer.  She has tried tylenol 3, zanaflex, ginger and turmeric, heat, ice, acupuncture, yoga, ice for pain at home with no relief.

## 2016-10-22 NOTE — ED Provider Notes (Signed)
[facility]-EMERGENCY DEPT Provider Note   Abigail Gomez Arrival date & time: 10/22/16  1514     History   Chief Complaint Chief Complaint  Patient presents with  . Back Pain  . Hip Pain    HPI ODA PLACKE is a 51 y.o. female.  The history is provided by the patient and medical records. No language interpreter was used.  Back Pain   Pertinent negatives include no fever, no numbness, no abdominal pain and no weakness.  Hip Pain  Pertinent negatives include no abdominal pain.   Abigail Gomez is a 51 y.o. female  with a PMH of fibromyalgia, prior right total hip arthroplasty who presents to the Emergency Department complaining of low back pain and right hip pain for several months. She notes radiation of right hip pain down leg just above knee. She has tried Tylenol 3, Zanaflex, heat, ice, acupuncture, hot yoga, ginger and Humira call with little relief. She states nothing helps with her pain. She was supposed to see Dr. Rayburn Ma today to discuss MRI results, however miscommunication appears to have occurred and she has not gotten an MRI yet. She states that she is here in the emergency department for the MRI. No numbness, bowel or bladder incontinence, fever/chills, history of IV drug use, recent spinal procedures, saddle anesthesia.   Past Medical History:  Diagnosis Date  . Allergy   . Anemia   . Anxiety   . Depression   . Disease    lymes disease  . Fibromyalgia   . H/o Lyme disease   . H/O sickle cell trait   . History of pelvic fracture   . History of urinary incontinence   . Hypertension   . Hypothyroidism   . Insomnia   . Labral tear of hip joint    Right Hip  . Migraine   . Osteoarthritis   . RLS (restless legs syndrome)   . Sleep apnea    cpap  . Thyroid disease   . TMJ syndrome   . Vitamin D deficiency     Patient Active Problem List   Diagnosis Date Noted  . Physical exam 08/31/2016  . Hyperlipidemia 02/28/2016  . Osteoarthritis of right hip  02/28/2016  . Status post total replacement of right hip 02/28/2016  . Obstructive sleep apnea syndrome 02/01/2016  . Pain of right hip joint 02/01/2016  . Hypertension 01/31/2016  . Restless leg syndrome 01/31/2016  . Sickle cell trait (HCC) 01/31/2016  . Gastroesophageal reflux 01/31/2016  . History of bunionectomy of both great toes 01/31/2016  . History of pelvic fracture 01/31/2016  . Trochanteric bursitis, right hip 01/26/2016  . Anxiety and depression 09/19/2015  . Fibromyalgia 09/19/2015  . Migraines 09/19/2015  . Hypothyroidism 09/19/2015  . Insomnia 08/20/2015  . Hypersomnia 08/20/2015  . Depression 08/20/2015    Past Surgical History:  Procedure Laterality Date  . ABDOMINAL HYSTERECTOMY    . BUNIONECTOMY Bilateral   . chiari malformation    . ENDOMETRIAL ABLATION    . MYOMECTOMY    . OOPHORECTOMY    . TONSILLECTOMY AND ADENOIDECTOMY    . TOTAL HIP ARTHROPLASTY Right 02/28/2016  . TOTAL HIP ARTHROPLASTY Right 02/28/2016   Procedure: RIGHT TOTAL HIP ARTHROPLASTY ANTERIOR APPROACH;  Surgeon: Kathryne Hitch, MD;  Location: [facility] OR;  Service: Orthopedics;  Laterality: Right;    OB History    No data available       Home Medications    Prior to Admission medications   Medication  Sig Start Date End Date Taking? Authorizing Provider  acetaminophen-codeine (TYLENOL #3) 300-30 MG tablet Take 1-2 tablets by mouth every 8 (eight) hours as needed for moderate pain. 10/10/16   Kathryne HitchBlackman, Christopher Y, MD  carisoprodol (SOMA) 350 MG tablet Take 1 tablet (350 mg total) by mouth daily. Patient taking differently: Take 350 mg by mouth as needed.  02/01/16   Pollyann Savoyeveshwar, Shaili, MD  cholecalciferol (VITAMIN D) 1000 units tablet Take 1,000 Units by mouth daily.    [provider]  clarithromycin (BIAXIN) 500 MG tablet Take 1 tablet (500 mg total) by mouth 2 (two) times daily. 08/23/16   Sheliah Hatchabori, Katherine E, MD  estradiol (VIVELLE-DOT) 0.075 MG/24HR Place 1 patch onto  the skin every other day.  06/03/14   [provider]  lidocaine (LIDODERM) 5 % Place 1 patch onto the skin daily. Remove & Discard patch within 12 hours or as directed by MD 08/24/16   Pollyann Savoyeveshwar, Shaili, MD  losartan-hydrochlorothiazide (HYZAAR) 50-12.5 MG tablet Take 1 tablet by mouth daily. 08/09/16   Sheliah Hatchabori, Katherine E, MD  Magnesium Amino Acid Chelate 20 % POWD Take 450 mg by mouth at bedtime.    [provider]  Melatonin 10 MG TABS Take 10 mg by mouth at bedtime.     [provider]  naproxen (NAPROSYN) 500 MG tablet Take 1 tablet (500 mg total) by mouth 2 (two) times daily with a meal. 03/13/16   Chestine Sporelark, Allayne GitelmanGilbert W, PA-C  OVER THE COUNTER MEDICATION Take 33.5 mg by mouth daily. Nature Thyroid OTC    [provider]  progesterone (PROMETRIUM) 100 MG capsule Take 100 mg by mouth at bedtime.  08/07/15   [provider]  promethazine-dextromethorphan (PROMETHAZINE-DM) 6.25-15 MG/5ML syrup Take 5 mLs by mouth 4 (four) times daily as needed. 08/23/16   Sheliah Hatchabori, Katherine E, MD  propranolol ER (INDERAL LA) 80 MG 24 hr capsule TAKE ONE CAPSULE BY MOUTH EVERY DAY 08/17/16   Sheliah Hatchabori, Katherine E, MD  rizatriptan (MAXALT-MLT) 10 MG disintegrating tablet Take 10 mg by mouth daily as needed for migraine.  10/13/14   [provider]  tiZANidine (ZANAFLEX) 4 MG tablet Take 1 tablet (4 mg total) by mouth every 12 (twelve) hours as needed for muscle spasms. 10/10/16   Kathryne HitchBlackman, Christopher Y, MD  VITAMIN B COMPLEX-C PO Take 1 capsule by mouth daily.    [provider]  Vortioxetine HBr (TRINTELLIX) 20 MG TABS Take 20 mg by mouth daily.     [provider]  zonisamide (ZONEGRAN) 50 MG capsule Take 150 mg by mouth at bedtime.    [provider]    Family History Family History  Problem Relation Age of Onset  . Hyperlipidemia Mother   . Diabetes Father   . Hypertension Father   . Cancer Father        prostate  . Diabetes Sister   .  Hypertension Sister   . Arthritis Maternal Grandmother   . Stroke Maternal Grandmother   . Hypertension Maternal Grandmother   . Hyperlipidemia Maternal Grandmother   . Hyperkalemia Maternal Grandmother   . Heart disease Maternal Grandmother   . Arthritis Paternal Grandmother   . Diabetes Sister     Social History Social History  Substance Use Topics  . Smoking status: Never Smoker  . Smokeless tobacco: Never Used  . Alcohol use 0.0 oz/week     Comment: occ     Allergies   Penicillins; Food; Lactose intolerance (gi); Lisinopril; and Percocet [oxycodone-acetaminophen]  Review of Systems Review of Systems  Constitutional: Negative for chills and fever.  Gastrointestinal: Negative for abdominal pain.  Genitourinary: Negative for difficulty urinating.  Musculoskeletal: Positive for arthralgias and back pain.  Neurological: Negative for weakness and numbness.     Physical Exam Updated Vital Signs BP 122/67   Pulse (!) 51   Temp 97.6 F (36.4 C) (Oral)   Resp 16   SpO2 100%   Physical Exam  Constitutional: She is oriented to person, place, and time. She appears well-developed and well-nourished. No distress.  HENT:  Head: Normocephalic and atraumatic.  Cardiovascular: Normal rate, regular rhythm and normal heart sounds.   No murmur heard. Pulmonary/Chest: Effort normal and breath sounds normal. No respiratory distress.  Abdominal: Soft. She exhibits no distension. There is no tenderness.  Musculoskeletal: She exhibits no edema.  Tenderness to palpation along the right lumbar paraspinal musculature and right lateral hip. Decreased range of motion likely secondary to pain. Patient ambulatory with cane which she states has been baseline for the last several months.  Neurological: She is alert and oriented to person, place, and time.  Bilateral lower extremities neurovascularly intact.  Skin: Skin is warm and dry.  Nursing note and vitals reviewed.    ED Treatments  / Results  Labs (all labs ordered are listed, but only abnormal results are displayed) Labs Reviewed - No data to display  EKG  EKG Interpretation None       Radiology No results found.  Procedures Procedures (including critical care time)  Medications Ordered in ED Medications - No data to display   Initial Impression / Assessment and Plan / ED Course  I have reviewed the triage vital signs and the nursing notes.  Pertinent labs & imaging results that were available during my care of the patient were reviewed by me and considered in my medical decision making (see chart for details).    CALIYA NARINE is a 51 y.o. female who presents to ED for chronic hip and low back pain. Patient is status post total right hip arthroplasty by orthopedics, Dr. Magnus Ivan. She was supposed to have follow-up appointment with him today, but states that no one called and she did not get the MRI they were supposed to discuss. No alleviating factors. No red flag symptoms of back pain. Lower extremities are neurovascularly intact. Patient is here requesting MRI. I do not see an emergent reason for MRI and believes this can be done as a routine outpatient image. Discussed this with patient who became very upset that we could not do this imaging today. I feel it is in the best interest of the patient to be evaluated by orthopedics and have imaging done at their discretion. Patient understands plan to follow up with her orthopedist. All questions were answered.   Final Clinical Impressions(s) / ED Diagnoses   Final diagnoses:  Chronic right hip pain  Left hip pain    New Prescriptions Discharge Medication List as of 10/22/2016  5:07 PM       Khoa Opdahl, Chase Picket, PA-C 10/22/16 1725    Geoffery Lyons, MD 10/22/16 1610

## 2016-10-22 NOTE — ED Notes (Signed)
Patient refused to take paper work, states we cannot help her and just wants to leave

## 2016-10-22 NOTE — Discharge Instructions (Signed)
Please call your orthopedist to schedule a follow up appointment.   Return to ER for new or worsening symptoms, any additional concerns.

## 2016-10-23 ENCOUNTER — Telehealth (INDEPENDENT_AMBULATORY_CARE_PROVIDER_SITE_OTHER): Payer: Self-pay | Admitting: Orthopaedic Surgery

## 2016-10-23 DIAGNOSIS — F411 Generalized anxiety disorder: Secondary | ICD-10-CM | POA: Diagnosis not present

## 2016-10-23 DIAGNOSIS — M545 Low back pain: Secondary | ICD-10-CM | POA: Diagnosis not present

## 2016-10-23 DIAGNOSIS — G4709 Other insomnia: Secondary | ICD-10-CM | POA: Diagnosis not present

## 2016-10-23 DIAGNOSIS — M797 Fibromyalgia: Secondary | ICD-10-CM | POA: Diagnosis not present

## 2016-10-23 NOTE — Telephone Encounter (Signed)
Records faxed to Dr. Caswell CorwinStubbs [phone].

## 2016-10-25 DIAGNOSIS — G4709 Other insomnia: Secondary | ICD-10-CM | POA: Diagnosis not present

## 2016-10-25 DIAGNOSIS — F411 Generalized anxiety disorder: Secondary | ICD-10-CM | POA: Diagnosis not present

## 2016-10-25 DIAGNOSIS — M545 Low back pain: Secondary | ICD-10-CM | POA: Diagnosis not present

## 2016-10-25 DIAGNOSIS — M797 Fibromyalgia: Secondary | ICD-10-CM | POA: Diagnosis not present

## 2016-10-31 DIAGNOSIS — M545 Low back pain: Secondary | ICD-10-CM | POA: Diagnosis not present

## 2016-10-31 DIAGNOSIS — M797 Fibromyalgia: Secondary | ICD-10-CM | POA: Diagnosis not present

## 2016-10-31 DIAGNOSIS — F411 Generalized anxiety disorder: Secondary | ICD-10-CM | POA: Diagnosis not present

## 2016-10-31 DIAGNOSIS — G4709 Other insomnia: Secondary | ICD-10-CM | POA: Diagnosis not present

## 2016-11-01 DIAGNOSIS — G4709 Other insomnia: Secondary | ICD-10-CM | POA: Diagnosis not present

## 2016-11-01 DIAGNOSIS — M797 Fibromyalgia: Secondary | ICD-10-CM | POA: Diagnosis not present

## 2016-11-01 DIAGNOSIS — M545 Low back pain: Secondary | ICD-10-CM | POA: Diagnosis not present

## 2016-11-01 DIAGNOSIS — F411 Generalized anxiety disorder: Secondary | ICD-10-CM | POA: Diagnosis not present

## 2016-11-05 DIAGNOSIS — Z96641 Presence of right artificial hip joint: Secondary | ICD-10-CM | POA: Diagnosis not present

## 2016-11-05 DIAGNOSIS — M25551 Pain in right hip: Secondary | ICD-10-CM | POA: Diagnosis not present

## 2016-11-05 DIAGNOSIS — Z9889 Other specified postprocedural states: Secondary | ICD-10-CM | POA: Diagnosis not present

## 2016-11-05 DIAGNOSIS — M25552 Pain in left hip: Secondary | ICD-10-CM | POA: Diagnosis not present

## 2016-11-06 DIAGNOSIS — R262 Difficulty in walking, not elsewhere classified: Secondary | ICD-10-CM | POA: Diagnosis not present

## 2016-11-06 DIAGNOSIS — M25551 Pain in right hip: Secondary | ICD-10-CM | POA: Diagnosis not present

## 2016-11-06 DIAGNOSIS — Z96641 Presence of right artificial hip joint: Secondary | ICD-10-CM | POA: Diagnosis not present

## 2016-11-07 DIAGNOSIS — F411 Generalized anxiety disorder: Secondary | ICD-10-CM | POA: Diagnosis not present

## 2016-11-07 DIAGNOSIS — G4709 Other insomnia: Secondary | ICD-10-CM | POA: Diagnosis not present

## 2016-11-07 DIAGNOSIS — M545 Low back pain: Secondary | ICD-10-CM | POA: Diagnosis not present

## 2016-11-07 DIAGNOSIS — M797 Fibromyalgia: Secondary | ICD-10-CM | POA: Diagnosis not present

## 2016-11-08 ENCOUNTER — Ambulatory Visit
Admission: RE | Admit: 2016-11-08 | Discharge: 2016-11-08 | Disposition: A | Discharge status: Home / Self Care | Payer: BLUE CROSS/BLUE SHIELD | Source: Ambulatory Visit | Attending: Physician Assistant | Admitting: Physician Assistant

## 2016-11-08 ENCOUNTER — Ambulatory Visit
Admission: RE | Admit: 2016-11-08 | Discharge: 2016-11-08 | Disposition: A | Discharge status: Home / Self Care | Payer: BLUE CROSS/BLUE SHIELD | Source: Ambulatory Visit | Attending: Orthopaedic Surgery | Admitting: Orthopaedic Surgery

## 2016-11-08 DIAGNOSIS — M25552 Pain in left hip: Secondary | ICD-10-CM

## 2016-11-08 DIAGNOSIS — M7062 Trochanteric bursitis, left hip: Secondary | ICD-10-CM | POA: Diagnosis not present

## 2016-11-08 DIAGNOSIS — M25551 Pain in right hip: Secondary | ICD-10-CM

## 2016-11-08 DIAGNOSIS — M4807 Spinal stenosis, lumbosacral region: Secondary | ICD-10-CM

## 2016-11-08 DIAGNOSIS — Z471 Aftercare following joint replacement surgery: Secondary | ICD-10-CM | POA: Diagnosis not present

## 2016-11-08 DIAGNOSIS — Z96651 Presence of right artificial knee joint: Secondary | ICD-10-CM | POA: Diagnosis not present

## 2016-11-08 DIAGNOSIS — M5126 Other intervertebral disc displacement, lumbar region: Secondary | ICD-10-CM | POA: Diagnosis not present

## 2016-11-08 MED ORDER — IOPAMIDOL (ISOVUE-M 200) INJECTION 41%
15.0000 mL | Freq: Once | INTRAMUSCULAR | Status: AC
  Administered 2016-11-08: 15 mL via INTRA_ARTICULAR

## 2016-11-15 DIAGNOSIS — R262 Difficulty in walking, not elsewhere classified: Secondary | ICD-10-CM | POA: Diagnosis not present

## 2016-11-15 DIAGNOSIS — M25551 Pain in right hip: Secondary | ICD-10-CM | POA: Diagnosis not present

## 2016-11-15 DIAGNOSIS — Z96641 Presence of right artificial hip joint: Secondary | ICD-10-CM | POA: Diagnosis not present

## 2016-11-15 DIAGNOSIS — R29898 Other symptoms and signs involving the musculoskeletal system: Secondary | ICD-10-CM | POA: Diagnosis not present

## 2016-11-21 ENCOUNTER — Telehealth: Payer: Self-pay | Admitting: *Deleted

## 2016-11-21 DIAGNOSIS — Z111 Encounter for screening for respiratory tuberculosis: Secondary | ICD-10-CM

## 2016-11-21 NOTE — Telephone Encounter (Signed)
Patient asking for approval for TB Quantiferon testing.  States that she has to get one every year - she has never done this here, only at [facility], so she is asking if we can do this here.

## 2016-11-21 NOTE — Telephone Encounter (Signed)
Patient scheduled for 11/22/16 9:45 lab appt.

## 2016-11-21 NOTE — Telephone Encounter (Signed)
This was ordered, if you want to schedule pt

## 2016-11-21 NOTE — Telephone Encounter (Signed)
Left message for patient to call to discuss.  

## 2016-11-21 NOTE — Telephone Encounter (Signed)
Ok for [employer] Financial[employer]iferon testing

## 2016-11-22 ENCOUNTER — Other Ambulatory Visit (INDEPENDENT_AMBULATORY_CARE_PROVIDER_SITE_OTHER): Payer: BLUE CROSS/BLUE SHIELD

## 2016-11-22 DIAGNOSIS — Z111 Encounter for screening for respiratory tuberculosis: Secondary | ICD-10-CM

## 2016-11-22 DIAGNOSIS — Z96641 Presence of right artificial hip joint: Secondary | ICD-10-CM | POA: Diagnosis not present

## 2016-11-22 DIAGNOSIS — R29898 Other symptoms and signs involving the musculoskeletal system: Secondary | ICD-10-CM | POA: Diagnosis not present

## 2016-11-22 DIAGNOSIS — M25551 Pain in right hip: Secondary | ICD-10-CM | POA: Diagnosis not present

## 2016-11-22 DIAGNOSIS — R262 Difficulty in walking, not elsewhere classified: Secondary | ICD-10-CM | POA: Diagnosis not present

## 2016-11-22 DIAGNOSIS — Z23 Encounter for immunization: Secondary | ICD-10-CM

## 2016-11-24 LAB — QUANTIFERON TB GOLD ASSAY (BLOOD)
QUANTIFERON(R)-TB GOLD: NEGATIVE
Quantiferon Nil Value: 0.06 IU/mL
Quantiferon Tb Ag Minus Nil Value: <0 IU/mL

## 2016-11-26 ENCOUNTER — Encounter: Payer: Self-pay | Admitting: General Practice

## 2016-11-27 DIAGNOSIS — M25551 Pain in right hip: Secondary | ICD-10-CM | POA: Diagnosis not present

## 2016-11-27 DIAGNOSIS — R29898 Other symptoms and signs involving the musculoskeletal system: Secondary | ICD-10-CM | POA: Diagnosis not present

## 2016-11-27 DIAGNOSIS — R262 Difficulty in walking, not elsewhere classified: Secondary | ICD-10-CM | POA: Diagnosis not present

## 2016-11-27 DIAGNOSIS — Z96641 Presence of right artificial hip joint: Secondary | ICD-10-CM | POA: Diagnosis not present

## 2016-12-06 ENCOUNTER — Telehealth: Payer: Self-pay | Admitting: Family Medicine

## 2016-12-06 DIAGNOSIS — M25552 Pain in left hip: Secondary | ICD-10-CM | POA: Diagnosis not present

## 2016-12-06 DIAGNOSIS — M5441 Lumbago with sciatica, right side: Secondary | ICD-10-CM | POA: Diagnosis not present

## 2016-12-06 DIAGNOSIS — M25551 Pain in right hip: Secondary | ICD-10-CM | POA: Diagnosis not present

## 2016-12-06 DIAGNOSIS — R262 Difficulty in walking, not elsewhere classified: Secondary | ICD-10-CM | POA: Diagnosis not present

## 2016-12-06 DIAGNOSIS — M5431 Sciatica, right side: Secondary | ICD-10-CM | POA: Diagnosis not present

## 2016-12-06 DIAGNOSIS — M9904 Segmental and somatic dysfunction of sacral region: Secondary | ICD-10-CM | POA: Diagnosis not present

## 2016-12-06 DIAGNOSIS — M62830 Muscle spasm of back: Secondary | ICD-10-CM | POA: Diagnosis not present

## 2016-12-06 DIAGNOSIS — M5136 Other intervertebral disc degeneration, lumbar region: Secondary | ICD-10-CM | POA: Diagnosis not present

## 2016-12-06 NOTE — Telephone Encounter (Signed)
Pt would like to have a consult with Dr Salomon Fick to see if she would like to switch.  Is that ok with you Dr Beverely Low and you Dr Salomon Fick?

## 2016-12-06 NOTE — Telephone Encounter (Signed)
I am fine with pt switching.  Typically we don't do consultations, but I wish her well with her new provider.  And Welcome, Dr Salomon Fick!!

## 2016-12-11 DIAGNOSIS — M25551 Pain in right hip: Secondary | ICD-10-CM | POA: Diagnosis not present

## 2016-12-11 DIAGNOSIS — R262 Difficulty in walking, not elsewhere classified: Secondary | ICD-10-CM | POA: Diagnosis not present

## 2016-12-11 DIAGNOSIS — M5136 Other intervertebral disc degeneration, lumbar region: Secondary | ICD-10-CM | POA: Diagnosis not present

## 2016-12-11 DIAGNOSIS — M25552 Pain in left hip: Secondary | ICD-10-CM | POA: Diagnosis not present

## 2016-12-19 DIAGNOSIS — M25551 Pain in right hip: Secondary | ICD-10-CM | POA: Diagnosis not present

## 2016-12-19 DIAGNOSIS — R262 Difficulty in walking, not elsewhere classified: Secondary | ICD-10-CM | POA: Diagnosis not present

## 2016-12-19 DIAGNOSIS — M25552 Pain in left hip: Secondary | ICD-10-CM | POA: Diagnosis not present

## 2016-12-19 DIAGNOSIS — M5136 Other intervertebral disc degeneration, lumbar region: Secondary | ICD-10-CM | POA: Diagnosis not present

## 2016-12-26 DIAGNOSIS — M25551 Pain in right hip: Secondary | ICD-10-CM | POA: Diagnosis not present

## 2016-12-26 DIAGNOSIS — R262 Difficulty in walking, not elsewhere classified: Secondary | ICD-10-CM | POA: Diagnosis not present

## 2016-12-26 DIAGNOSIS — M25552 Pain in left hip: Secondary | ICD-10-CM | POA: Diagnosis not present

## 2016-12-26 DIAGNOSIS — M5136 Other intervertebral disc degeneration, lumbar region: Secondary | ICD-10-CM | POA: Diagnosis not present

## 2017-01-08 NOTE — Telephone Encounter (Signed)
Pt was scheduled by another person in the office

## 2017-01-09 ENCOUNTER — Encounter: Payer: Self-pay | Admitting: Family Medicine

## 2017-01-09 ENCOUNTER — Ambulatory Visit (INDEPENDENT_AMBULATORY_CARE_PROVIDER_SITE_OTHER): Payer: BLUE CROSS/BLUE SHIELD | Admitting: Family Medicine

## 2017-01-09 VITALS — BP 110/80 | HR 44 | Temp 97.9°F | Ht 68.0 in | Wt 191.3 lb

## 2017-01-09 DIAGNOSIS — N951 Menopausal and female climacteric states: Secondary | ICD-10-CM | POA: Diagnosis not present

## 2017-01-09 DIAGNOSIS — Z0189 Encounter for other specified special examinations: Secondary | ICD-10-CM | POA: Diagnosis not present

## 2017-01-09 DIAGNOSIS — Z719 Counseling, unspecified: Secondary | ICD-10-CM

## 2017-01-09 DIAGNOSIS — Z780 Asymptomatic menopausal state: Secondary | ICD-10-CM | POA: Diagnosis not present

## 2017-01-09 NOTE — Progress Notes (Signed)
Patient presents to clinic today for consultation to decide if she wants to transfer care.  SUBJECTIVE: PMH:  Pt is a 51 yo with sig pmh including fibromyalgia, migraines, HTN, adrenal fatigue, Lyme disease, hypothyroidism, hip dysplasia, iliopsoas bursitis, osteo-arthritis status post right hip replacement, Chiari malformation status post repair, IBS-C, sickle cell trait, anxiety and depression.  Patient is followed by multiple specialist including Dr. Abran Duke in [place] for fibromyalgia, Dallie Piles, NP at [facility] integrative, Charna Elizabeth, MD gastroenterology. Livingston Diones, MD OB/GYN.  Dr. Corliss Skains Rheumatology. Dr. Lanny Cramp.  Anise Salvo, MD Neurology.  Garnetta Buddy, MD Pulmonology.  Pt has a Development worker, international aid.  Patient states she is having more issues over the years more recently she has been struggling to find a provider that treats fibromyalgia and Lyme disease.  Liable blood pressure: Patient has a history of adrenal fatigue being treated by [facility]. Patient endorses elevated BP at night but hypotension in the morning. She takes losartan-hydrochlorothiazide 50-12 0.5 mg.  Allergies: Penicillin-patient passed out Lisinopril-cough Percocet-itching, paranoia, hives, delusions Lactose intolerance  Past surgical history: Numerous. 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 Patient denies tobacco and drug use. Patient endorses occasional alcohol use.   Past Medical History:  Diagnosis Date  . Allergy   . Anemia   . Anxiety   . Depression   . Disease    lymes disease  . Fibromyalgia   . H/o Lyme disease   . H/O sickle cell trait   . History of pelvic fracture   . History of urinary incontinence   . Hypertension   . Hypothyroidism   . Insomnia   . Labral tear of hip joint    Right Hip  .  Migraine   . Osteoarthritis   . RLS (restless legs syndrome)   . Sleep apnea    cpap  . Thyroid disease   . TMJ syndrome   . Vitamin D deficiency     Past Surgical History:  Procedure Laterality Date  . ABDOMINAL HYSTERECTOMY    . BUNIONECTOMY Bilateral   . chiari malformation    . ENDOMETRIAL ABLATION    . MYOMECTOMY    . OOPHORECTOMY    . TONSILLECTOMY AND ADENOIDECTOMY    . TOTAL HIP ARTHROPLASTY Right 02/28/2016  . TOTAL HIP ARTHROPLASTY Right 02/28/2016   Procedure: RIGHT TOTAL HIP ARTHROPLASTY ANTERIOR APPROACH;  Surgeon: Kathryne Hitch, MD;  Location: [facility] OR;  Service: Orthopedics;  Laterality: Right;    Current Outpatient Prescriptions on File Prior to Visit  Medication Sig Dispense Refill  . carisoprodol (SOMA) 350 MG tablet Take 1 tablet (350 mg total) by mouth daily. (Patient taking differently: Take 350 mg by mouth as needed. ) 30 tablet 4  . cholecalciferol (VITAMIN D) 1000 units tablet Take 1,000 Units by mouth daily.    Marland Kitchen estradiol (VIVELLE-DOT) 0.075 MG/24HR Place 1 patch onto the skin every other day.     . lidocaine (LIDODERM) 5 % Place 1 patch onto the skin daily. Remove & Discard patch within 12 hours or as directed by MD 90 patch 1  . losartan-hydrochlorothiazide (HYZAAR) 50-12.5 MG tablet Take 1 tablet by mouth daily. 90 tablet 1  . Magnesium Amino Acid Chelate 20 % POWD Take 450 mg by mouth at bedtime.    . Melatonin 10 MG TABS Take 10 mg  by mouth at bedtime.     . naproxen (NAPROSYN) 500 MG tablet Take 1 tablet (500 mg total) by mouth 2 (two) times daily with a meal. 60 tablet 1  . OVER THE COUNTER MEDICATION Take 33.5 mg by mouth daily. Nature Thyroid OTC    . progesterone (PROMETRIUM) 100 MG capsule Take 100 mg by mouth at bedtime.   3  . promethazine-dextromethorphan (PROMETHAZINE-DM) 6.25-15 MG/5ML syrup Take 5 mLs by mouth 4 (four) times daily as needed. 240 mL 0  . propranolol ER (INDERAL LA) 80 MG 24 hr capsule TAKE ONE CAPSULE BY MOUTH EVERY  DAY 90 capsule 1  . rizatriptan (MAXALT-MLT) 10 MG disintegrating tablet Take 10 mg by mouth daily as needed for migraine.     Marland Kitchen. tiZANidine (ZANAFLEX) 4 MG tablet Take 1 tablet (4 mg total) by mouth every 12 (twelve) hours as needed for muscle spasms. 40 tablet 0  . VITAMIN B COMPLEX-C PO Take 1 capsule by mouth daily.    . Vortioxetine HBr (TRINTELLIX) 20 MG TABS Take 20 mg by mouth daily.     Marland Kitchen. zonisamide (ZONEGRAN) 50 MG capsule Take 150 mg by mouth at bedtime.     No current facility-administered medications on file prior to visit.     Allergies  Allergen Reactions  . Penicillins Other (See Comments)    Syncope Has patient had a PCN reaction causing immediate rash, facial/tongue/throat swelling, SOB or lightheadedness with hypotension:Yes Has patient had a PCN reaction causing severe rash involving mucus membranes or skin necrosis:No Has patient had a PCN reaction that required hospitalization:No Has patient had a PCN reaction occurring within the last 10 years:No If all of the above answers are "NO", then may proceed with Cephalosporin use.   . Food     Nuts-itching/swelling Bananas-itching/swelling  . Lactose Intolerance (Gi) Other (See Comments)    G.I. upset  . Lisinopril Cough  . Percocet [Oxycodone-Acetaminophen] Itching    Family History  Problem Relation Age of Onset  . Hyperlipidemia Mother   . Diabetes Father   . Hypertension Father   . Cancer Father        prostate  . Diabetes Sister   . Hypertension Sister   . Arthritis Maternal Grandmother   . Stroke Maternal Grandmother   . Hypertension Maternal Grandmother   . Hyperlipidemia Maternal Grandmother   . Hyperkalemia Maternal Grandmother   . Heart disease Maternal Grandmother   . Arthritis Paternal Grandmother   . Diabetes Sister     Social History   Social History  . Marital status: Single    Spouse name: N/A  . Number of children: N/A  . Years of education: N/A   Occupational History  . Not on  file.   Social History Main Topics  . Smoking status: Never Smoker  . Smokeless tobacco: Never Used  . Alcohol use 0.0 oz/week     Comment: occ  . Drug use: No  . Sexual activity: Not on file   Other Topics Concern  . Not on file   Social History Narrative  . No narrative on file    ROS See above HPI. BP 110/80 (BP Location: Left Arm, Patient Position: Sitting, Cuff Size: Normal)   Pulse (!) 44   Temp 97.9 F (36.6 C) (Oral)   Ht 5\' 8"  (1.727 m)   Wt 191 lb 4.8 oz (86.8 kg)   BMI 29.09 kg/m   Physical Exam No physical exam performed.  Recent Results (from the past 2160  hour(s))  Quantiferon tb gold assay (blood)     Status: None   Collection Time: 11/22/16  9:49 AM  Result Value Ref Range   QUANTIFERON(R)-TB GOLD NEGATIVE NEGATIVE    Comment: Negative test result. M. tuberculosis complex  infection unlikely.    Quantiferon Nil Value 0.06 IU/mL   Mitogen-Nil >10.00 IU/mL   Quantiferon Tb Ag Minus Nil Value <0.00 IU/mL    Comment: The Nil tube value is used to determine if the patient has a preexisting immune response which could cause a  false-positive reading on the test. In order for a  test to be valid, the Nil tube must have a value of  less than or equal to 8.0 IU/mL. Marland Kitchen The mitogen control tube is used to assure the patient  has a healthy immune status and also serves as a  control for correct blood handling and incubation. It  is used to detect false-negative readings. The mitogen  tube must have a gamma interferon value of greater  than or equal to 0.5 IU/mL higher than the value of  the Nil tube. . The TB antigen tube is coated with the M. tuberculosis specific antigens. For a test to be considered  positive, the TB antigen tube value minus the Nil tube  value must be greater than or equal to 0.35 IU/mL. Marland Kitchen For additional information, please refer to  http://education.questdiagnostics.com/faq/QFT (This link is being provided for  informational/ educational purposes only.) .     Assessment/Plan: Encounter for consultation -Pt deciding if she wants to establish care

## 2017-01-22 DIAGNOSIS — M25552 Pain in left hip: Secondary | ICD-10-CM | POA: Diagnosis not present

## 2017-01-22 DIAGNOSIS — M7061 Trochanteric bursitis, right hip: Secondary | ICD-10-CM | POA: Diagnosis not present

## 2017-01-29 DIAGNOSIS — M9904 Segmental and somatic dysfunction of sacral region: Secondary | ICD-10-CM | POA: Diagnosis not present

## 2017-01-29 DIAGNOSIS — M5431 Sciatica, right side: Secondary | ICD-10-CM | POA: Diagnosis not present

## 2017-01-29 DIAGNOSIS — M5441 Lumbago with sciatica, right side: Secondary | ICD-10-CM | POA: Diagnosis not present

## 2017-01-29 DIAGNOSIS — M62830 Muscle spasm of back: Secondary | ICD-10-CM | POA: Diagnosis not present

## 2017-02-07 DIAGNOSIS — F411 Generalized anxiety disorder: Secondary | ICD-10-CM | POA: Diagnosis not present

## 2017-02-08 ENCOUNTER — Ambulatory Visit (INDEPENDENT_AMBULATORY_CARE_PROVIDER_SITE_OTHER): Payer: BLUE CROSS/BLUE SHIELD | Admitting: Family Medicine

## 2017-02-08 ENCOUNTER — Encounter: Payer: Self-pay | Admitting: Family Medicine

## 2017-02-08 VITALS — BP 100/70 | HR 53 | Temp 97.4°F | Wt 190.6 lb

## 2017-02-08 DIAGNOSIS — J0100 Acute maxillary sinusitis, unspecified: Secondary | ICD-10-CM

## 2017-02-08 DIAGNOSIS — T3695XA Adverse effect of unspecified systemic antibiotic, initial encounter: Secondary | ICD-10-CM

## 2017-02-08 DIAGNOSIS — B379 Candidiasis, unspecified: Secondary | ICD-10-CM

## 2017-02-08 DIAGNOSIS — J302 Other seasonal allergic rhinitis: Secondary | ICD-10-CM

## 2017-02-08 MED ORDER — FLUTICASONE PROPIONATE 50 MCG/ACT NA SUSP: 1.0000 | Freq: Every day | NASAL | 6 refills | Status: DC

## 2017-02-08 MED ORDER — AZITHROMYCIN 250 MG PO TABS: ORAL_TABLET | ORAL | 0 refills | Status: DC

## 2017-02-08 MED ORDER — FLUCONAZOLE 150 MG PO TABS: 150.0000 mg | ORAL_TABLET | Freq: Once | ORAL | 0 refills | Status: AC

## 2017-02-08 NOTE — Patient Instructions (Addendum)
It is ok to start taking Allegra for your allergies.  The allegra-D (with the decongestant) may cause your blood pressure to become elevated.  It is ok to take the regular Allegra and a cold medicine that is designed for people with High blood pressure such as Coricidin HP.  You can always ask your pharmacist to help you. Allergies, Adult An allergy is when your body's defense system (immune system) overreacts to an otherwise harmless substance (allergen) that you breathe in or eat or something that touches your skin. When you come into contact with something that you are allergic to, your immune system produces certain proteins (antibodies). These proteins cause cells to release chemicals (histamines) that trigger the symptoms of an allergic reaction. Allergies often affect the nasal passages (allergic rhinitis), eyes (allergic conjunctivitis), skin (atopic dermatitis), and stomach. Allergies can be mild or severe. Allergies cannot spread from person to person (are not contagious). They can develop at any age and may be outgrown. What increases the risk? You may be at greater risk of allergies if other people in your family have allergies. What are the signs or symptoms? Symptoms depend on what type of allergy you have. They may include:  Runny, stuffy nose.  Sneezing.  Itchy mouth, ears, or throat.  Postnasal drip.  Sore throat.  Itchy, red, watery, or puffy eyes.  Skin rash or hives.  Stomach pain.  Vomiting.  Diarrhea.  Bloating.  Wheezing or coughing.  People with a severe allergy to food, medicine, or an insect bite may have a life-threatening allergic reaction (anaphylaxis). Symptoms of anaphylaxis include:  Hives.  Itching.  Flushed face.  Swollen lips, tongue, or mouth.  Tight or swollen throat.  Chest pain or tightness in the chest.  Trouble breathing or shortness of breath.  Rapid heartbeat.  Dizziness or fainting.  Vomiting.  Diarrhea.  Pain in the  abdomen.  How is this diagnosed? This condition is diagnosed based on:  Your symptoms.  Your family and medical history.  A physical exam.  You may need to see a health care provider who specializes in treating allergies (allergist). You may also have tests, including:  Skin tests to see which allergens are causing your symptoms, such as: ? Skin prick test. In this test, your skin is pricked with a tiny needle and exposed to small amounts of possible allergens to see if your skin reacts. ? Intradermal skin test. In this test, a small amount of allergen is injected under your skin to see if your skin reacts. ? Patch test. In this test, a small amount of allergen is placed on your skin and then your skin is covered with a bandage. Your health care provider will check your skin after a couple of days to see if a rash has developed.  Blood tests.  Challenges tests. In this test, you inhale a small amount of allergen by mouth to see if you have an allergic reaction.  You may also be asked to:  Keep a food diary. A food diary is a record of all the foods and drinks you have in a day and any symptoms you experience.  Practice an elimination diet. An elimination diet involves eliminating specific foods from your diet and then adding them back in one by one to find out if a certain food causes an allergic reaction.  How is this treated? Treatment for allergies depends on your symptoms. Treatment may include:  Cold compresses to soothe itching and swelling.  Eye drops.  Nasal sprays.  Using a saline spray or container (neti pot) to flush out the nose (nasal irrigation). These methods can help clear away mucus and keep the nasal passages moist.  Using a humidifier.  Oral antihistamines or other medicines to block allergic reaction and inflammation.  Skin creams to treat rashes or itching.  Diet changes to eliminate food allergy triggers.  Repeated exposure to tiny amounts of  allergens to build up a tolerance and prevent future allergic reactions (immunotherapy). These include: ? Allergy shots. ? Oral treatment. This involves taking small doses of an allergen under the tongue (sublingual immunotherapy).  Emergency epinephrine injection (auto-injector) in case of an allergic emergency. This is a self-injectable, pre-measured medicine that must be given within the first few minutes of a serious allergic reaction.  Follow these instructions at home:  Avoid known allergens whenever possible.  If you suffer from airborne allergens, wash out your nose daily. You can do this with a saline spray or a neti pot to flush out your nose (nasal irrigation).  Take over-the-counter and prescription medicines only as told by your health care provider.  Keep all follow-up visits as told by your health care provider. This is important.  If you are at risk of a severe allergic reaction (anaphylaxis), keep your auto-injector with you at all times.  If you have ever had anaphylaxis, wear a medical alert bracelet or necklace that states you have a severe allergy. Contact a health care provider if:  Your symptoms do not improve with treatment. Get help right away if:  You have symptoms of anaphylaxis, such as: ? Swollen mouth, tongue, or throat. ? Pain or tightness in your chest. ? Trouble breathing or shortness of breath. ? Dizziness or fainting. ? Severe abdominal pain, vomiting, or diarrhea. This information is not intended to replace advice given to you by your health care provider. Make sure you discuss any questions you have with your health care provider. Document Released: 05/22/2002 Document Revised: 10/27/2015 Document Reviewed: 09/14/2015 Elsevier Interactive Patient Education  2018 [employer].  Sinusitis, Adult Sinusitis is soreness and inflammation of your sinuses. Sinuses are hollow spaces in the bones around your face. Your sinuses are located:  Around your  eyes.  In the middle of your forehead.  Behind your nose.  In your cheekbones.  Your sinuses and nasal passages are lined with a stringy fluid (mucus). Mucus normally drains out of your sinuses. When your nasal tissues become inflamed or swollen, the mucus can become trapped or blocked so air cannot flow through your sinuses. This allows bacteria, viruses, and funguses to grow, which leads to infection. Sinusitis can develop quickly and last for 7?10 days (acute) or for more than 12 weeks (chronic). Sinusitis often develops after a cold. What are the causes? This condition is caused by anything that creates swelling in the sinuses or stops mucus from draining, including:  Allergies.  Asthma.  Bacterial or viral infection.  Abnormally shaped bones between the nasal passages.  Nasal growths that contain mucus (nasal polyps).  Narrow sinus openings.  Pollutants, such as chemicals or irritants in the air.  A foreign object stuck in the nose.  A fungal infection. This is rare.  What increases the risk? The following factors may make you more likely to develop this condition:  Having allergies or asthma.  Having had a recent cold or respiratory tract infection.  Having structural deformities or blockages in your nose or sinuses.  Having a weak immune system.  Doing a lot of swimming or diving.  Overusing nasal sprays.  Smoking.  What are the signs or symptoms? The main symptoms of this condition are pain and a feeling of pressure around the affected sinuses. Other symptoms include:  Upper toothache.  Earache.  Headache.  Bad breath.  Decreased sense of smell and taste.  A cough that may get worse at night.  Fatigue.  Fever.  Thick drainage from your nose. The drainage is often green and it may contain pus (purulent).  Stuffy nose or congestion.  Postnasal drip. This is when extra mucus collects in the throat or back of the nose.  Swelling and warmth  over the affected sinuses.  Sore throat.  Sensitivity to light.  How is this diagnosed? This condition is diagnosed based on symptoms, a medical history, and a physical exam. To find out if your condition is acute or chronic, your health care provider may:  Look in your nose for signs of nasal polyps.  Tap over the affected sinus to check for signs of infection.  View the inside of your sinuses using an imaging device that has a light attached (endoscope).  If your health care provider suspects that you have chronic sinusitis, you may also:  Be tested for allergies.  Have a sample of mucus taken from your nose (nasal culture) and checked for bacteria.  Have a mucus sample examined to see if your sinusitis is related to an allergy.  If your sinusitis does not respond to treatment and it lasts longer than 8 weeks, you may have an MRI or CT scan to check your sinuses. These scans also help to determine how severe your infection is. In rare cases, a bone biopsy may be done to rule out more serious types of fungal sinus disease. How is this treated? Treatment for sinusitis depends on the cause and whether your condition is chronic or acute. If a virus is causing your sinusitis, your symptoms will go away on their own within 10 days. You may be given medicines to relieve your symptoms, including:  Topical nasal decongestants. They shrink swollen nasal passages and let mucus drain from your sinuses.  Antihistamines. These drugs block inflammation that is triggered by allergies. This can help to ease swelling in your nose and sinuses.  Topical nasal corticosteroids. These are nasal sprays that ease inflammation and swelling in your nose and sinuses.  Nasal saline washes. These rinses can help to get rid of thick mucus in your nose.  If your condition is caused by bacteria, you will be given an antibiotic medicine. If your condition is caused by a fungus, you will be given an antifungal  medicine. Surgery may be needed to correct underlying conditions, such as narrow nasal passages. Surgery may also be needed to remove polyps. Follow these instructions at home: Medicines  Take, use, or apply over-the-counter and prescription medicines only as told by your health care provider. These may include nasal sprays.  If you were prescribed an antibiotic medicine, take it as told by your health care provider. Do not stop taking the antibiotic even if you start to feel better. Hydrate and Humidify  Drink enough water to keep your urine clear or pale yellow. Staying hydrated will help to thin your mucus.  Use a cool mist humidifier to keep the humidity level in your home above 50%.  Inhale steam for 10-15 minutes, 3-4 times a day or as told by your health care provider. You can do this in  the bathroom while a hot shower is running.  Limit your exposure to cool or dry air. Rest  Rest as much as possible.  Sleep with your head raised (elevated).  Make sure to get enough sleep each night. General instructions  Apply a warm, moist washcloth to your face 3-4 times a day or as told by your health care provider. This will help with discomfort.  Wash your hands often with soap and water to reduce your exposure to viruses and other germs. If soap and water are not available, use hand sanitizer.  Do not smoke. Avoid being around people who are smoking (secondhand smoke).  Keep all follow-up visits as told by your health care provider. This is important. Contact a health care provider if:  You have a fever.  Your symptoms get worse.  Your symptoms do not improve within 10 days. Get help right away if:  You have a severe headache.  You have persistent vomiting.  You have pain or swelling around your face or eyes.  You have vision problems.  You develop confusion.  Your neck is stiff.  You have trouble breathing. This information is not intended to replace advice given  to you by your health care provider. Make sure you discuss any questions you have with your health care provider. Document Released: 02/26/2005 Document Revised: 10/23/2015 Document Reviewed: 12/22/2014 Elsevier Interactive Patient Education  2017 [employer]

## 2017-02-08 NOTE — Progress Notes (Signed)
Subjective:    Patient ID: Abigail Gomez, female    DOB: 07/18/1965, 51 y.o.   MRN: [id number]  No chief complaint on file.   HPI Patient was seen today for acute concern.  Nasal congestion, productive cough, irritated throat, "sour stomach" from increased phlegm, ear pressure, night sweats, rhinorrhea, sneezing x 2 wks.  Pt has a h/o allergies.  Was taking daily allergy meds, but feels things have become worse.  Also tried Xyzol, cough syrup, Mucinex with no relief.  In the past was on Flonase.  Denies sick contacts.  Endorses staying hydrated.  Allergies: PCN, Percocet, lisinopril.  Past Medical History:  Diagnosis Date  . Allergy   . Anemia   . Anxiety   . Depression   . Disease    lymes disease  . Fibromyalgia   . H/o Lyme disease   . H/O sickle cell trait   . History of pelvic fracture   . History of urinary incontinence   . Hypertension   . Hypothyroidism   . Insomnia   . Labral tear of hip joint    Right Hip  . Migraine   . Osteoarthritis   . RLS (restless legs syndrome)   . Sleep apnea    cpap  . Thyroid disease   . TMJ syndrome   . Vitamin D deficiency     Allergies  Allergen Reactions  . Penicillins Other (See Comments)    Syncope Has patient had a PCN reaction causing immediate rash, facial/tongue/throat swelling, SOB or lightheadedness with hypotension:Yes Has patient had a PCN reaction causing severe rash involving mucus membranes or skin necrosis:No Has patient had a PCN reaction that required hospitalization:No Has patient had a PCN reaction occurring within the last 10 years:No If all of the above answers are "NO", then may proceed with Cephalosporin use.   . Food     Nuts-itching/swelling Bananas-itching/swelling  . Lactose Intolerance (Gi) Other (See Comments)    G.I. upset  . Lisinopril Cough  . Percocet [Oxycodone-Acetaminophen] Itching    ROS +productive cough, nasal congestion, irritated throat, "sour stomach", increased ear pressure,  night sweats, rhinorrhea, and sneezing.  Remaining ROS negative    Objective:    Blood pressure 100/70, pulse (!) 53, temperature (!) 97.4 F (36.3 C), temperature source Oral, weight 190 lb 9.6 oz (86.5 kg).   Gen. Pleasant, well-nourished, in no distress, normal affect  HEENT: [place]/AT, face symmetric, no scleral icterus, PERRLA, nares patent without drainage, pharynx with postnasal drainage and erythema, no exudate.  Cerumen in canals b/l, TMs without erythema, full. Lungs: productive cough, no accessory muscle use, CTAB, no wheezes or rales Cardiovascular: RRR, no m/r/g, no peripheral edema Abdomen: BS present, soft, NT/ND Neuro:  A&Ox3, CN II-XII intact, normal gait Skin:  Warm, no lesions/ rash   Wt Readings from Last 3 Encounters:  02/08/17 190 lb 9.6 oz (86.5 kg)  01/09/17 191 lb 4.8 oz (86.8 kg)  08/31/16 186 lb (84.4 kg)    Lab Results  Component Value Date   WBC 4.7 08/31/2016   HGB 10.9 (L) 08/31/2016   HCT 32.3 (L) 08/31/2016   PLT 183.0 08/31/2016   GLUCOSE 104 (H) 08/31/2016   CHOL 149 08/31/2016   TRIG 50.0 08/31/2016   HDL 49.00 08/31/2016   LDLCALC 90 08/31/2016   ALT 13 08/31/2016   AST 20 08/31/2016   NA 141 08/31/2016   K 4.5 08/31/2016   CL 106 08/31/2016   CREATININE 0.91 08/31/2016   BUN 11 08/31/2016  CO2 29 08/31/2016   TSH 2.04 08/31/2016   HGBA1C 5.1 08/23/2014    Assessment/Plan:  Acute non-recurrent maxillary sinusitis  - Plan: azithromycin (ZITHROMAX) 250 MG tablet  Seasonal allergies -Ok to take [employer] daily.  Pt advised that the Allegra-D may make elevate her bp.  Advised that she can take regular Allegra and an OTC cold medicine designed for ppl with HTN.  - Plan: fluticasone (FLONASE) 50 MCG/ACT nasal spray  Antibiotic induced yeast infection -diflucan sent to pharmacy   F/u prn in the next few days if no improvement.

## 2017-02-09 ENCOUNTER — Other Ambulatory Visit: Payer: Self-pay | Admitting: Family Medicine

## 2017-02-11 DIAGNOSIS — M5441 Lumbago with sciatica, right side: Secondary | ICD-10-CM | POA: Diagnosis not present

## 2017-02-11 DIAGNOSIS — M9904 Segmental and somatic dysfunction of sacral region: Secondary | ICD-10-CM | POA: Diagnosis not present

## 2017-02-11 DIAGNOSIS — M5431 Sciatica, right side: Secondary | ICD-10-CM | POA: Diagnosis not present

## 2017-02-11 DIAGNOSIS — M62830 Muscle spasm of back: Secondary | ICD-10-CM | POA: Diagnosis not present

## 2017-02-12 DIAGNOSIS — M546 Pain in thoracic spine: Secondary | ICD-10-CM | POA: Diagnosis not present

## 2017-02-12 DIAGNOSIS — M5136 Other intervertebral disc degeneration, lumbar region: Secondary | ICD-10-CM | POA: Diagnosis not present

## 2017-02-12 DIAGNOSIS — M48062 Spinal stenosis, lumbar region with neurogenic claudication: Secondary | ICD-10-CM | POA: Diagnosis not present

## 2017-02-12 DIAGNOSIS — M549 Dorsalgia, unspecified: Secondary | ICD-10-CM | POA: Diagnosis not present

## 2017-02-12 DIAGNOSIS — M4316 Spondylolisthesis, lumbar region: Secondary | ICD-10-CM | POA: Diagnosis not present

## 2017-02-12 DIAGNOSIS — M47816 Spondylosis without myelopathy or radiculopathy, lumbar region: Secondary | ICD-10-CM | POA: Diagnosis not present

## 2017-02-21 DIAGNOSIS — M5136 Other intervertebral disc degeneration, lumbar region: Secondary | ICD-10-CM | POA: Diagnosis not present

## 2017-02-21 DIAGNOSIS — M4726 Other spondylosis with radiculopathy, lumbar region: Secondary | ICD-10-CM | POA: Diagnosis not present

## 2017-02-21 DIAGNOSIS — M48061 Spinal stenosis, lumbar region without neurogenic claudication: Secondary | ICD-10-CM | POA: Diagnosis not present

## 2017-02-25 ENCOUNTER — Ambulatory Visit: Payer: BLUE CROSS/BLUE SHIELD | Admitting: Family Medicine

## 2017-02-25 DIAGNOSIS — F411 Generalized anxiety disorder: Secondary | ICD-10-CM | POA: Diagnosis not present

## 2017-02-27 DIAGNOSIS — M9904 Segmental and somatic dysfunction of sacral region: Secondary | ICD-10-CM | POA: Diagnosis not present

## 2017-02-27 DIAGNOSIS — Z1231 Encounter for screening mammogram for malignant neoplasm of breast: Secondary | ICD-10-CM | POA: Diagnosis not present

## 2017-02-27 DIAGNOSIS — M5431 Sciatica, right side: Secondary | ICD-10-CM | POA: Diagnosis not present

## 2017-02-27 DIAGNOSIS — R6882 Decreased libido: Secondary | ICD-10-CM | POA: Diagnosis not present

## 2017-02-27 DIAGNOSIS — Z01419 Encounter for gynecological examination (general) (routine) without abnormal findings: Secondary | ICD-10-CM | POA: Diagnosis not present

## 2017-02-27 DIAGNOSIS — M62830 Muscle spasm of back: Secondary | ICD-10-CM | POA: Diagnosis not present

## 2017-02-27 DIAGNOSIS — M5441 Lumbago with sciatica, right side: Secondary | ICD-10-CM | POA: Diagnosis not present

## 2017-02-27 DIAGNOSIS — M797 Fibromyalgia: Secondary | ICD-10-CM | POA: Diagnosis not present

## 2017-02-27 DIAGNOSIS — M2550 Pain in unspecified joint: Secondary | ICD-10-CM | POA: Diagnosis not present

## 2017-02-27 DIAGNOSIS — Z6827 Body mass index (BMI) 27.0-27.9, adult: Secondary | ICD-10-CM | POA: Diagnosis not present

## 2017-02-28 ENCOUNTER — Ambulatory Visit (INDEPENDENT_AMBULATORY_CARE_PROVIDER_SITE_OTHER): Payer: BLUE CROSS/BLUE SHIELD | Admitting: Family Medicine

## 2017-02-28 ENCOUNTER — Encounter: Payer: Self-pay | Admitting: Family Medicine

## 2017-02-28 VITALS — BP 122/70 | HR 57 | Temp 98.3°F | Wt 183.4 lb

## 2017-02-28 DIAGNOSIS — M545 Low back pain: Secondary | ICD-10-CM | POA: Diagnosis not present

## 2017-02-28 DIAGNOSIS — F32A Depression, unspecified: Secondary | ICD-10-CM

## 2017-02-28 DIAGNOSIS — A6920 Lyme disease, unspecified: Secondary | ICD-10-CM | POA: Diagnosis not present

## 2017-02-28 DIAGNOSIS — F329 Major depressive disorder, single episode, unspecified: Secondary | ICD-10-CM

## 2017-02-28 DIAGNOSIS — M797 Fibromyalgia: Secondary | ICD-10-CM

## 2017-02-28 DIAGNOSIS — G4709 Other insomnia: Secondary | ICD-10-CM | POA: Diagnosis not present

## 2017-02-28 DIAGNOSIS — I10 Essential (primary) hypertension: Secondary | ICD-10-CM | POA: Diagnosis not present

## 2017-02-28 DIAGNOSIS — F411 Generalized anxiety disorder: Secondary | ICD-10-CM | POA: Diagnosis not present

## 2017-02-28 DIAGNOSIS — F419 Anxiety disorder, unspecified: Secondary | ICD-10-CM

## 2017-02-28 NOTE — Patient Instructions (Addendum)
DASH Eating Plan °DASH stands for "Dietary Approaches to Stop Hypertension." The DASH eating plan is a healthy eating plan that has been shown to reduce high blood pressure (hypertension). It may also reduce your risk for type 2 diabetes, heart disease, and stroke. The DASH eating plan may also help with weight loss. °What are tips for following this plan? °General guidelines °· Avoid eating more than 2,300 mg (milligrams) of salt (sodium) a day. If you have hypertension, you may need to reduce your sodium intake to 1,500 mg a day. °· Limit alcohol intake to no more than 1 drink a day for nonpregnant women and 2 drinks a day for men. One drink equals 12 oz of beer, 5 oz of wine, or 1½ oz of hard liquor. °· Work with your health care provider to maintain a healthy body weight or to lose weight. Ask what an ideal weight is for you. °· Get at least 30 minutes of exercise that causes your heart to beat faster (aerobic exercise) most days of the week. Activities may include walking, swimming, or biking. °· Work with your health care provider or diet and nutrition specialist (dietitian) to adjust your eating plan to your individual calorie needs. °Reading food labels °· Check food labels for the amount of sodium per serving. Choose foods with less than 5 percent of the Daily Value of sodium. Generally, foods with less than 300 mg of sodium per serving fit into this eating plan. °· To find whole grains, look for the word "whole" as the first word in the ingredient list. °Shopping °· Buy products labeled as "low-sodium" or "no salt added." °· Buy fresh foods. Avoid canned foods and premade or frozen meals. °Cooking °· Avoid adding salt when cooking. Use salt-free seasonings or herbs instead of table salt or sea salt. Check with your health care provider or pharmacist before using salt substitutes. °· Do not fry foods. Cook foods using healthy methods such as baking, boiling, grilling, and broiling instead. °· Cook with  heart-healthy oils, such as olive, canola, soybean, or sunflower oil. °Meal planning ° °· Eat a balanced diet that includes: °? 5 or more servings of fruits and vegetables each day. At each meal, try to fill half of your plate with fruits and vegetables. °? Up to 6-8 servings of whole grains each day. °? Less than 6 oz of lean meat, poultry, or fish each day. A 3-oz serving of meat is about the same size as a deck of cards. One egg equals 1 oz. °? 2 servings of low-fat dairy each day. °? A serving of nuts, seeds, or beans 5 times each week. °? Heart-healthy fats. Healthy fats called Omega-3 fatty acids are found in foods such as flaxseeds and coldwater fish, like sardines, salmon, and mackerel. °· Limit how much you eat of the following: °? Canned or prepackaged foods. °? Food that is high in trans fat, such as fried foods. °? Food that is high in saturated fat, such as fatty meat. °? Sweets, desserts, sugary drinks, and other foods with added sugar. °? Full-fat dairy products. °· Do not salt foods before eating. °· Try to eat at least 2 vegetarian meals each week. °· Eat more home-cooked food and less restaurant, buffet, and fast food. °· When eating at a restaurant, ask that your food be prepared with less salt or no salt, if possible. °What foods are recommended? °The items listed may not be a complete list. Talk with your dietitian about what   dietary choices are best for you. °Grains °Whole-grain or whole-wheat bread. Whole-grain or whole-wheat pasta. Brown rice. Oatmeal. Quinoa. Bulgur. Whole-grain and low-sodium cereals. Pita bread. Low-fat, low-sodium crackers. Whole-wheat flour tortillas. °Vegetables °Fresh or frozen vegetables (raw, steamed, roasted, or grilled). Low-sodium or reduced-sodium tomato and vegetable juice. Low-sodium or reduced-sodium tomato sauce and tomato paste. Low-sodium or reduced-sodium canned vegetables. °Fruits °All fresh, dried, or frozen fruit. Canned fruit in natural juice (without  added sugar). °Meat and other protein foods °Skinless chicken or turkey. Ground chicken or turkey. Pork with fat trimmed off. Fish and seafood. Egg whites. Dried beans, peas, or lentils. Unsalted nuts, nut butters, and seeds. Unsalted canned beans. Lean cuts of beef with fat trimmed off. Low-sodium, lean deli meat. °Dairy °Low-fat (1%) or fat-free (skim) milk. Fat-free, low-fat, or reduced-fat cheeses. Nonfat, low-sodium ricotta or cottage cheese. Low-fat or nonfat yogurt. Low-fat, low-sodium cheese. °Fats and oils °Soft margarine without trans fats. Vegetable oil. Low-fat, reduced-fat, or light mayonnaise and salad dressings (reduced-sodium). Canola, safflower, olive, soybean, and sunflower oils. Avocado. °Seasoning and other foods °Herbs. Spices. Seasoning mixes without salt. Unsalted popcorn and pretzels. Fat-free sweets. °What foods are not recommended? °The items listed may not be a complete list. Talk with your dietitian about what dietary choices are best for you. °Grains °Baked goods made with fat, such as croissants, muffins, or some breads. Dry pasta or rice meal packs. °Vegetables °Creamed or fried vegetables. Vegetables in a cheese sauce. Regular canned vegetables (not low-sodium or reduced-sodium). Regular canned tomato sauce and paste (not low-sodium or reduced-sodium). Regular tomato and vegetable juice (not low-sodium or reduced-sodium). Pickles. Olives. °Fruits °Canned fruit in a light or heavy syrup. Fried fruit. Fruit in cream or butter sauce. °Meat and other protein foods °Fatty cuts of meat. Ribs. Fried meat. Bacon. Sausage. Bologna and other processed lunch meats. Salami. Fatback. Hotdogs. Bratwurst. Salted nuts and seeds. Canned beans with added salt. Canned or smoked fish. Whole eggs or egg yolks. Chicken or turkey with skin. °Dairy °Whole or 2% milk, cream, and half-and-half. Whole or full-fat cream cheese. Whole-fat or sweetened yogurt. Full-fat cheese. Nondairy creamers. Whipped toppings.  Processed cheese and cheese spreads. °Fats and oils °Butter. Stick margarine. Lard. Shortening. Ghee. Bacon fat. Tropical oils, such as coconut, palm kernel, or palm oil. °Seasoning and other foods °Salted popcorn and pretzels. Onion salt, garlic salt, seasoned salt, table salt, and sea salt. Worcestershire sauce. Tartar sauce. Barbecue sauce. Teriyaki sauce. Soy sauce, including reduced-sodium. Steak sauce. Canned and packaged gravies. Fish sauce. Oyster sauce. Cocktail sauce. Horseradish that you find on the shelf. Ketchup. Mustard. Meat flavorings and tenderizers. Bouillon cubes. Hot sauce and Tabasco sauce. Premade or packaged marinades. Premade or packaged taco seasonings. Relishes. Regular salad dressings. °Where to find more information: °· National Heart, Lung, and Blood Institute: www.nhlbi.nih.gov °· American Heart Association: www.heart.org °Summary °· The DASH eating plan is a healthy eating plan that has been shown to reduce high blood pressure (hypertension). It may also reduce your risk for type 2 diabetes, heart disease, and stroke. °· With the DASH eating plan, you should limit salt (sodium) intake to 2,300 mg a day. If you have hypertension, you may need to reduce your sodium intake to 1,500 mg a day. °· When on the DASH eating plan, aim to eat more fresh fruits and vegetables, whole grains, lean proteins, low-fat dairy, and heart-healthy fats. °· Work with your health care provider or diet and nutrition specialist (dietitian) to adjust your eating plan to your individual   calorie needs. °This information is not intended to replace advice given to you by your health care provider. Make sure you discuss any questions you have with your health care provider. °Document Released: 02/15/2011 Document Revised: 02/20/2016 Document Reviewed: 02/20/2016 °Elsevier Interactive Patient Education © 2018 [employer] ° ° °How to Take Your Blood Pressure °Blood pressure is a measurement of how strongly your blood  is pressing against the walls of your arteries. Arteries are blood vessels that carry blood from your heart throughout your body. Your health care provider takes your blood pressure at each office visit. You can also take your own blood pressure at home with a blood pressure machine. You may need to take your own blood pressure: °· To confirm a diagnosis of high blood pressure (hypertension). °· To monitor your blood pressure over time. °· To make sure your blood pressure medicine is working. ° °Supplies needed: °To take your blood pressure, you will need a blood pressure machine. You can buy a blood pressure machine, or blood pressure monitor, at most drugstores or online. There are several types of home blood pressure monitors. When choosing one, consider the following: °· Choose a monitor that has an arm cuff. °· Choose a monitor that wraps snugly around your upper arm. You should be able to fit only one finger between your arm and the cuff. °· Do not choose a monitor that measures your blood pressure from your wrist or finger. ° °Your health care provider can suggest a reliable monitor that will meet your needs. °How to prepare °To get the most accurate reading, avoid the following for 30 minutes before you check your blood pressure: °· Drinking caffeine. °· Drinking alcohol. °· Eating. °· Smoking. °· Exercising. ° °Five minutes before you check your blood pressure: °· Empty your bladder. °· Sit quietly without talking in a dining chair, rather than in a soft couch or armchair. ° °How to take your blood pressure °To check your blood pressure, follow the instructions in the manual that came with your blood pressure monitor. If you have a digital blood pressure monitor, the instructions may be as follows: °1. Sit up straight. °2. Place your feet on the floor. Do not cross your ankles or legs. °3. Rest your left arm at the level of your heart on a table or desk or on the arm of a chair. °4. Pull up your shirt  sleeve. °5. Wrap the blood pressure cuff around the upper part of your left arm, 1 inch (2.5 cm) above your elbow. It is best to wrap the cuff around bare skin. °6. Fit the cuff snugly around your arm. You should be able to place only one finger between the cuff and your arm. °7. Position the cord inside the groove of your elbow. °8. Press the power button. °9. Sit quietly while the cuff inflates and deflates. °10. Read the digital reading on the monitor screen and write it down (record it). °11. Wait 2-3 minutes, then repeat the steps, starting at step 1. ° °What does my blood pressure reading mean? °A blood pressure reading consists of a higher number over a lower number. Ideally, your blood pressure should be below 120/80. The first ("top") number is called the systolic pressure. It is a measure of the pressure in your arteries as your heart beats. The second ("bottom") number is called the diastolic pressure. It is a measure of the pressure in your arteries as the heart relaxes. °Blood pressure is classified into four   stages. The following are the stages for adults who do not have a short-term serious illness or a chronic condition. Systolic pressure and diastolic pressure are measured in a unit called mm Hg. °Normal °· Systolic pressure: below 120. °· Diastolic pressure: below 80. °Elevated °· Systolic pressure: 120-129. °· Diastolic pressure: below 80. °Hypertension stage 1 °· Systolic pressure: 130-139. °· Diastolic pressure: 80-89. °Hypertension stage 2 °· Systolic pressure: 140 or above. °· Diastolic pressure: 90 or above. °You can have prehypertension or hypertension even if only the systolic or only the diastolic number in your reading is higher than normal. °Follow these instructions at home: °· Check your blood pressure as often as recommended by your health care provider. °· Take your monitor to the next appointment with your health care provider to make sure: °? That you are using it  correctly. °? That it provides accurate readings. °· Be sure you understand what your goal blood pressure numbers are. °· Tell your health care provider if you are having any side effects from blood pressure medicine. °Contact a health care provider if: °· Your blood pressure is consistently high. °Get help right away if: °· Your systolic blood pressure is higher than 180. °· Your diastolic blood pressure is higher than 110. °This information is not intended to replace advice given to you by your health care provider. Make sure you discuss any questions you have with your health care provider. °Document Released: 08/05/2015 Document Revised: 10/18/2015 Document Reviewed: 08/05/2015 °Elsevier Interactive Patient Education © 2018 Else[employer] Inc. ° °

## 2017-03-01 ENCOUNTER — Encounter: Payer: Self-pay | Admitting: Family Medicine

## 2017-03-01 DIAGNOSIS — M545 Low back pain: Secondary | ICD-10-CM | POA: Diagnosis not present

## 2017-03-01 DIAGNOSIS — F411 Generalized anxiety disorder: Secondary | ICD-10-CM | POA: Diagnosis not present

## 2017-03-01 DIAGNOSIS — G4709 Other insomnia: Secondary | ICD-10-CM | POA: Diagnosis not present

## 2017-03-01 DIAGNOSIS — M797 Fibromyalgia: Secondary | ICD-10-CM | POA: Diagnosis not present

## 2017-03-01 NOTE — Progress Notes (Signed)
Subjective:    Patient ID: Trellis MomentNicole N Valletta, female    DOB: 10-14-1965, 51 y.o.   MRN: [id number]  No chief complaint on file.   HPI Patient was seen today for f/u.  Patient with concerns for her blood pressure.  Pt notices in the am her blood pressure is low/normal but it increases in the evening.  Patient has been taking her blood pressure medications in the a.m. afternoon readings have been 181/100, 180/107.  This is concerning as patient has a personal and family history of brain aneurysm.  Patient also expresses concern over not being able to find a rheumatologist for her fibromyalgia or an internal medicine physician specializing treatment of Lyme disease.  Patient was seen Dr. Abran [place]ancesca [place].  This is becoming costly as the provider is cash only/does not accept insurance.  At one point patient was prescribed low-dose naltrexone for fibromyalgia and chronic pain.  At times patient's fibromyalgia has kept her from getting out of the bed.  Towards end of visit patient becomes tearful when thinking about state of current health.  She is followed by a counselor and has an upcoming appointment this week.  Patient is taking Trintellix 20 mg.  Past Medical History:  Diagnosis Date  . Allergy   . Anemia   . Anxiety   . Depression   . Disease    lymes disease  . Fibromyalgia   . H/o Lyme disease   . H/O sickle cell trait   . History of pelvic fracture   . History of urinary incontinence   . Hypertension   . Hypothyroidism   . Insomnia   . Labral tear of hip joint    Right Hip  . Migraine   . Osteoarthritis   . RLS (restless legs syndrome)   . Sleep apnea    cpap  . Thyroid disease   . TMJ syndrome   . Vitamin D deficiency     Allergies  Allergen Reactions  . Penicillins Other (See Comments)    Syncope Has patient had a PCN reaction causing immediate rash, facial/tongue/throat swelling, SOB or lightheadedness with hypotension:Yes Has patient had  a PCN reaction causing severe rash involving mucus membranes or skin necrosis:No Has patient had a PCN reaction that required hospitalization:No Has patient had a PCN reaction occurring within the last 10 years:No If all of the above answers are "NO", then may proceed with Cephalosporin use.   . Food     Nuts-itching/swelling Bananas-itching/swelling  . Lactose Intolerance (Gi) Other (See Comments)    G.I. upset  . Lisinopril Cough  . Percocet [Oxycodone-Acetaminophen] Itching    ROS General: Denies fever, chills, night sweats, changes in weight, changes in appetite HEENT: Denies headaches, ear pain, changes in vision, rhinorrhea, sore throat CV: Denies CP, palpitations, SOB, orthopnea Pulm: Denies SOB, cough, wheezing GI: Denies abdominal pain, nausea, vomiting, diarrhea, constipation GU: Denies dysuria, hematuria, frequency, vaginal discharge Msk: Denies muscle cramps   +joint pain (back, hip) Neuro: Denies weakness, numbness, tingling Skin: Denies rashes, bruising Psych: Denies anxiety, hallucinations  +depression     Objective:    Blood pressure 122/70, pulse (!) 57, temperature 98.3 F (36.8 C), temperature source Oral, weight 183 lb 6.4 oz (83.2 kg).   Gen. Pleasant, well-nourished, in no distress, normal affect at one point patient becomes tearful when thinking about home of her health problems. HEENT: [place]/AT, face symmetric, no scleral icterus, PERRLA, nares patent without drainage. Lungs: no accessory muscle use, CTAB,  no wheezes or rales Cardiovascular: RRR, no m/r/g, no peripheral edema Neuro:  A&Ox3, CN II-XII intact, normal gait Skin:  Warm, no lesions/ rash   Wt Readings from Last 3 Encounters:  02/28/17 183 lb 6.4 oz (83.2 kg)  02/08/17 190 lb 9.6 oz (86.5 kg)  01/09/17 191 lb 4.8 oz (86.8 kg)    Lab Results  Component Value Date   WBC 4.7 08/31/2016   HGB 10.9 (L) 08/31/2016   HCT 32.3 (L) 08/31/2016   PLT 183.0 08/31/2016   GLUCOSE 104 (H)  08/31/2016   CHOL 149 08/31/2016   TRIG 50.0 08/31/2016   HDL 49.00 08/31/2016   LDLCALC 90 08/31/2016   ALT 13 08/31/2016   AST 20 08/31/2016   NA 141 08/31/2016   K 4.5 08/31/2016   CL 106 08/31/2016   CREATININE 0.91 08/31/2016   BUN 11 08/31/2016   CO2 29 08/31/2016   TSH 2.04 08/31/2016   HGBA1C 5.1 08/23/2014    Assessment/Plan:  Fibromyalgia  - Plan: Ambulatory referral to Rheumatology  Lyme disease  - Plan: Ambulatory referral to Infectious Disease  Essential hypertension -We will adjust timing of taking blood pressure medication.  Patient will take losartan-hydrochlorothiazide 50-12.5 mg and propranolol ER 80 mg at lunchtime given history of afternoon high blood pressure readings. -Patient will continue to check BP at home and keep a log.  Patient to update clinic if blood pressure continues to be elevated.  Anxiety and depression -Continue taking Trintellix  20 mg -Pt encouraged to continue seeing her therapist.  Has an appt this wk. -will continue to assess at each [facility].    F/u in 1 mo for HTN and other concerns.   Abbe AmsterdamShannon Banks, MD

## 2017-03-08 DIAGNOSIS — M545 Low back pain: Secondary | ICD-10-CM | POA: Diagnosis not present

## 2017-03-08 DIAGNOSIS — M797 Fibromyalgia: Secondary | ICD-10-CM | POA: Diagnosis not present

## 2017-03-08 DIAGNOSIS — G4709 Other insomnia: Secondary | ICD-10-CM | POA: Diagnosis not present

## 2017-03-08 DIAGNOSIS — F411 Generalized anxiety disorder: Secondary | ICD-10-CM | POA: Diagnosis not present

## 2017-03-19 ENCOUNTER — Ambulatory Visit: Payer: BLUE CROSS/BLUE SHIELD | Admitting: Internal Medicine

## 2017-03-29 DIAGNOSIS — R6882 Decreased libido: Secondary | ICD-10-CM | POA: Diagnosis not present

## 2017-04-02 ENCOUNTER — Telehealth: Payer: Self-pay | Admitting: Family Medicine

## 2017-04-02 DIAGNOSIS — M25551 Pain in right hip: Secondary | ICD-10-CM | POA: Diagnosis not present

## 2017-04-02 DIAGNOSIS — A6920 Lyme disease, unspecified: Secondary | ICD-10-CM

## 2017-04-02 DIAGNOSIS — M7611 Psoas tendinitis, right hip: Secondary | ICD-10-CM | POA: Diagnosis not present

## 2017-04-02 DIAGNOSIS — M7061 Trochanteric bursitis, right hip: Secondary | ICD-10-CM | POA: Diagnosis not present

## 2017-04-02 DIAGNOSIS — Z471 Aftercare following joint replacement surgery: Secondary | ICD-10-CM | POA: Diagnosis not present

## 2017-04-02 DIAGNOSIS — Z96641 Presence of right artificial hip joint: Secondary | ICD-10-CM | POA: Diagnosis not present

## 2017-04-02 NOTE — Telephone Encounter (Signed)
Copied from CRM [id number]. Topic: Inquiry >> Apr 02, 2017  4:12 PM Abigail BergeronBarksdale, Abigail B wrote: Reason for CRM: [facility] and state the pt no showed her appt, and the dr states he will not see her unless a new referral is made, IF the pt is still needing to come in

## 2017-04-03 NOTE — Telephone Encounter (Signed)
Referral has been placed. Nothing further needed.   

## 2017-04-03 NOTE — Telephone Encounter (Signed)
Please place a new referral for the pt to see ID for lyme dz.

## 2017-04-03 NOTE — Telephone Encounter (Signed)
Patient states that she must have not written down the appt time and date down, but she would still like to see infectious diease and would like another referral placed. Would you like for me to place referral?

## 2017-04-04 DIAGNOSIS — Z6828 Body mass index (BMI) 28.0-28.9, adult: Secondary | ICD-10-CM | POA: Diagnosis not present

## 2017-04-04 DIAGNOSIS — Q283 Other malformations of cerebral vessels: Secondary | ICD-10-CM | POA: Diagnosis not present

## 2017-04-04 DIAGNOSIS — I10 Essential (primary) hypertension: Secondary | ICD-10-CM | POA: Diagnosis not present

## 2017-04-05 ENCOUNTER — Encounter: Payer: Self-pay | Admitting: Family

## 2017-04-05 ENCOUNTER — Ambulatory Visit (INDEPENDENT_AMBULATORY_CARE_PROVIDER_SITE_OTHER): Payer: BLUE CROSS/BLUE SHIELD | Admitting: Family

## 2017-04-05 ENCOUNTER — Other Ambulatory Visit: Payer: Self-pay | Admitting: Family

## 2017-04-05 VITALS — BP 126/81 | HR 64 | Temp 98.3°F | Ht 68.0 in | Wt 169.0 lb

## 2017-04-05 DIAGNOSIS — M797 Fibromyalgia: Secondary | ICD-10-CM

## 2017-04-05 DIAGNOSIS — M5136 Other intervertebral disc degeneration, lumbar region: Secondary | ICD-10-CM | POA: Diagnosis not present

## 2017-04-05 DIAGNOSIS — Z79899 Other long term (current) drug therapy: Secondary | ICD-10-CM | POA: Diagnosis not present

## 2017-04-05 NOTE — Assessment & Plan Note (Signed)
Ms. Abigail Gomez appears to have symptoms consistent with fibromyalgia or a complex pain syndrome. I explained that despite the positive IgM test being positive for Lyme, given the length of time from initial infection there is an increased likelihood that this may be a false positive test. Many of the symptoms she describes are not consistent with Lyme Disease. Although it is possible Lyme Disease may be present, there was no improvement with doxycycline. She also has had several episodes of EBV which may be a contributing factor to her symptoms as well. There is no sign of infection or indication for antibiotics at present time. I encouraged her to work with Rheumatology for further assessment as she referred several times to an "auto-immune" process. I am happy to see her back as needed.

## 2017-04-05 NOTE — Patient Instructions (Addendum)
Nice to meet you!  You have a lot going on and have done a great job working through it.  Please continue to take your medication as prescribed.  Would not recommend any antibiotic treatment at this time.   Continue to work with Rheumatology.  Follow up as needed.

## 2017-04-05 NOTE — Progress Notes (Signed)
Subjective:    Patient ID: Abigail Gomez, female    DOB: 1965-11-26, 52 y.o.   MRN: [id number]  Chief Complaint  Patient presents with  . Lyme Disease    HPI:  Abigail Gomez is a 52 y.o. female who presents today for evaluation of Lyme Disease.   Ms. Slovacek has an extensive history of multiple medical conditions including fibromyalgia and a history of Lyme Disease. She is here today to discuss the Lyme disease. She believes that when she was around 52 years of age she remembers seeing a bulls-eye type rash that was never treated. She was in her typical state of health until college when she began feeling fatigue and noted that her sleeping pattern had changed. This led to cascade of symptoms over the course of the next several years including continuous joint pain. She was diagnosed with fibromyalgia in 2004 following a myomectomy. She continued to have headaches as well and noted to have a Chiari malformation.   She was tested for Lyme Disease in March of 2017 showing a negative IGG and a positive IGM with IgM 41 Ab and IgM 23 Ab being positive. IgM p39 was negative. The test was repeated but she is unsure where the test result. She completed a 1.5 month course of doxycycline that did not help with her symptoms and left her with yeast infection.  Continues to have severe pains throughout her body and there is nothing that seems to be helping. She is working to establish with a Insurance claims handler and is considering [facility] to help with her symptom management. 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 She continues to face depression and anxiety.   Allergies  Allergen Reactions  . Peanut Oil Anaphylaxis  . Penicillins Other (See Comments) and Anaphylaxis    Syncope Has patient had a PCN reaction causing immediate rash, facial/tongue/throat swelling, SOB or lightheadedness with  hypotension:Yes Has patient had a PCN reaction causing severe rash involving mucus membranes or skin necrosis:No Has patient had a PCN reaction that required hospitalization:No Has patient had a PCN reaction occurring within the last 10 years:No If all of the above answers are "NO", then may proceed with Cephalosporin use.   . Food     Nuts-itching/swelling Bananas-itching/swelling  . Lactose Intolerance (Gi) Other (See Comments)    G.I. upset  . Lactulose Other (See Comments)    G.I. upset  . Lisinopril Cough  . Other Itching    Nuts-itching/swelling Bananas-itching/swelling  . Oxycodone-Acetaminophen Itching and Other (See Comments)      Outpatient Medications Prior to Visit  Medication Sig Dispense Refill  . estradiol (CLIMARA - DOSED IN MG/24 HR) 0.1 mg/24hr patch Place 0.1 mg onto the skin 2 (two) times a week.    . cholecalciferol (VITAMIN D) 1000 units tablet Take 1,000 Units by mouth daily.    . fluticasone (FLONASE) 50 MCG/ACT nasal spray Place 1 spray into both nostrils daily. 16 g 6  . lidocaine (LIDODERM) 5 % Place 1 patch onto the skin daily. Remove & Discard patch within 12 hours or as directed by MD 90 patch 1  . losartan-hydrochlorothiazide (HYZAAR) 50-12.5 MG tablet Take 1 tablet by mouth daily. 90 tablet 1  . Magnesium Amino Acid Chelate 20 % POWD Take 450 mg by mouth at bedtime.    . Melatonin 10 MG TABS Take 10 mg by mouth at bedtime.     Marland Kitchen  NATURE-THROID 32.5 MG tablet TAKE 1 TABLET IN THE MORNING 30 MINUTES PRIOR TO FOOD, MEDS, OR VITAMINS  1  . rizatriptan (MAXALT-MLT) 10 MG disintegrating tablet Take 10 mg by mouth daily as needed for migraine.     Marland Kitchen tiZANidine (ZANAFLEX) 4 MG tablet Take 1 tablet (4 mg total) by mouth every 12 (twelve) hours as needed for muscle spasms. 40 tablet 0  . UNABLE TO FIND CX-NALTREXONE 2MG - TAKE 1 CAPSULE BY MOUTH AT BEDTIME    . VITAMIN B COMPLEX-C PO Take 1 capsule by mouth daily.    . Vortioxetine HBr (TRINTELLIX) 20 MG TABS  Take 20 mg by mouth daily.     Marland Kitchen zonisamide (ZONEGRAN) 50 MG capsule Take 150 mg by mouth at bedtime.    Marland Kitchen estradiol (ESTRACE) 0.1 MG/GM vaginal cream Place 1 Applicatorful vaginally at bedtime.    Marland Kitchen estradiol (VIVELLE-DOT) 0.075 MG/24HR Place 1 patch onto the skin every other day.     Marland Kitchen OVER THE COUNTER MEDICATION Take 33.5 mg by mouth daily. Nature Thyroid OTC    . progesterone (PROMETRIUM) 100 MG capsule Take 200 mg by mouth at bedtime.   3  . promethazine-dextromethorphan (PROMETHAZINE-DM) 6.25-15 MG/5ML syrup Take 5 mLs by mouth 4 (four) times daily as needed. 240 mL 0  . propranolol ER (INDERAL LA) 80 MG 24 hr capsule TAKE 1 CAPSULE BY MOUTH EVERY DAY 90 capsule 1  . REXULTI 1 MG TABS      No facility-administered medications prior to visit.      Past Medical History:  Diagnosis Date  . Allergy   . Anemia   . Anxiety   . Depression   . Disease    lymes disease  . Fibromyalgia   . H/o Lyme disease   . H/O sickle cell trait   . History of pelvic fracture   . History of urinary incontinence   . Hypertension   . Hypothyroidism   . Insomnia   . Labral tear of hip joint    Right Hip  . Migraine   . Osteoarthritis   . RLS (restless legs syndrome)   . Sleep apnea    cpap  . Spinal stenosis   . Thyroid disease   . TMJ syndrome   . Vitamin D deficiency       Past Surgical History:  Procedure Laterality Date  . ABDOMINAL HYSTERECTOMY    . BUNIONECTOMY Bilateral   . chiari malformation    . ENDOMETRIAL ABLATION    . MYOMECTOMY    . OOPHORECTOMY    . TONSILLECTOMY AND ADENOIDECTOMY    . TOTAL HIP ARTHROPLASTY Right 02/28/2016  . TOTAL HIP ARTHROPLASTY Right 02/28/2016   Procedure: RIGHT TOTAL HIP ARTHROPLASTY ANTERIOR APPROACH;  Surgeon: Kathryne Hitch, MD;  Location[facility];  Service: Orthopedics;  Laterality: Right;      Family History  Problem Relation Age of Onset  . Hyperlipidemia Mother   . Diabetes Father   . Hypertension Father   . Cancer  Father        prostate  . Diabetes Sister   . Hypertension Sister   . Arthritis Maternal Grandmother   . Stroke Maternal Grandmother   . Hypertension Maternal Grandmother   . Hyperlipidemia Maternal Grandmother   . Hyperkalemia Maternal Grandmother   . Heart disease Maternal Grandmother   . Arthritis Paternal Grandmother   . Diabetes Sister       Social History   Socioeconomic History  . Marital status: Single  Spouse name: Not on file  . Number of children: Not on file  . Years of education: Not on file  . Highest education level: Not on file  Social Needs  . Financial resource strain: Not on file  . Food insecurity - worry: Not on file  . Food insecurity - inability: Not on file  . Transportation needs - medical: Not on file  . Transportation needs - non-medical: Not on file  Occupational History  . Not on file  Tobacco Use  . Smoking status: Never Smoker  . Smokeless tobacco: Never Used  Substance and Sexual Activity  . Alcohol use: Yes    Alcohol/week: 0.0 oz    Comment: occ  . Drug use: No  . Sexual activity: Not on file  Other Topics Concern  . Not on file  Social History Narrative  . Not on file      Review of Systems  Constitutional: Negative for activity change, appetite change, chills, fever and unexpected weight change.  Respiratory: Negative for cough, chest tightness, shortness of breath and wheezing.   Cardiovascular: Negative for chest pain, palpitations and leg swelling.  Genitourinary: Negative for dysuria.  Musculoskeletal: Positive for arthralgias and myalgias. Negative for joint swelling.  Skin: Negative for rash.  Neurological: Positive for headaches.       Objective:    BP 126/81   Pulse 64   Temp 98.3 F (36.8 C) (Oral)   Ht 5\' 8"  (1.727 m)   Wt 169 lb (76.7 kg)   BMI 25.70 kg/m  Nursing note and vital signs reviewed.  Physical Exam  Constitutional: She is oriented to person, place, and time. She appears well-developed  and well-nourished. No distress.  Cardiovascular: Normal rate, regular rhythm, normal heart sounds and intact distal pulses.  Pulmonary/Chest: Effort normal and breath sounds normal.  Neurological: She is alert and oriented to person, place, and time.  Skin: Skin is warm and dry. No rash noted.  Psychiatric: She has a normal mood and affect. Her behavior is normal. Judgment and thought content normal.        Assessment & Plan:   Problem List Items Addressed This Visit      Other   Fibromyalgia - Primary    Ms. Edwyna ReadyOfford appears to have symptoms consistent with fibromyalgia or a complex pain syndrome. I explained that despite the positive IgM test being positive for Lyme, given the length of time from initial infection there is an increased likelihood that this may be a false positive test. Many of the symptoms she describes are not consistent with Lyme Disease. Although it is possible Lyme Disease may be present, there was no improvement with doxycycline. She also has had several episodes of EBV which may be a contributing factor to her symptoms as well. There is no sign of infection or indication for antibiotics at present time. I encouraged her to work with Rheumatology for further assessment as she referred several times to an "auto-immune" process. I am happy to see her back as needed.           I have discontinued Dorothy PufferNicole N. Rung's progesterone, OVER THE COUNTER MEDICATION, promethazine-dextromethorphan, propranolol ER, and REXULTI. I am also having her maintain her cholecalciferol, vortioxetine HBr, rizatriptan, Melatonin, zonisamide, VITAMIN B COMPLEX-C PO, Magnesium Amino Acid Chelate, losartan-hydrochlorothiazide, lidocaine, tiZANidine, NATURE-THROID, UNABLE TO FIND, fluticasone, and estradiol.   No orders of the defined types were placed in this encounter.   Follow-up:  As needed  Jeanine LuzGregory Calone, FNP [facility]

## 2017-04-11 DIAGNOSIS — N3281 Overactive bladder: Secondary | ICD-10-CM | POA: Diagnosis not present

## 2017-04-11 DIAGNOSIS — N393 Stress incontinence (female) (male): Secondary | ICD-10-CM | POA: Diagnosis not present

## 2017-04-17 DIAGNOSIS — R262 Difficulty in walking, not elsewhere classified: Secondary | ICD-10-CM | POA: Diagnosis not present

## 2017-04-17 DIAGNOSIS — R29898 Other symptoms and signs involving the musculoskeletal system: Secondary | ICD-10-CM | POA: Diagnosis not present

## 2017-04-17 DIAGNOSIS — M25551 Pain in right hip: Secondary | ICD-10-CM | POA: Diagnosis not present

## 2017-04-17 DIAGNOSIS — M5136 Other intervertebral disc degeneration, lumbar region: Secondary | ICD-10-CM | POA: Diagnosis not present

## 2017-04-30 DIAGNOSIS — R6882 Decreased libido: Secondary | ICD-10-CM | POA: Diagnosis not present

## 2017-05-01 DIAGNOSIS — F411 Generalized anxiety disorder: Secondary | ICD-10-CM | POA: Diagnosis not present

## 2017-05-01 DIAGNOSIS — F333 Major depressive disorder, recurrent, severe with psychotic symptoms: Secondary | ICD-10-CM | POA: Diagnosis not present

## 2017-05-01 DIAGNOSIS — F605 Obsessive-compulsive personality disorder: Secondary | ICD-10-CM | POA: Diagnosis not present

## 2017-05-02 DIAGNOSIS — M7061 Trochanteric bursitis, right hip: Secondary | ICD-10-CM | POA: Diagnosis not present

## 2017-05-02 DIAGNOSIS — R29898 Other symptoms and signs involving the musculoskeletal system: Secondary | ICD-10-CM | POA: Diagnosis not present

## 2017-05-02 DIAGNOSIS — M25551 Pain in right hip: Secondary | ICD-10-CM | POA: Diagnosis not present

## 2017-05-02 DIAGNOSIS — M5136 Other intervertebral disc degeneration, lumbar region: Secondary | ICD-10-CM | POA: Diagnosis not present

## 2017-05-08 DIAGNOSIS — R262 Difficulty in walking, not elsewhere classified: Secondary | ICD-10-CM | POA: Diagnosis not present

## 2017-05-08 DIAGNOSIS — R29898 Other symptoms and signs involving the musculoskeletal system: Secondary | ICD-10-CM | POA: Diagnosis not present

## 2017-05-08 DIAGNOSIS — M25551 Pain in right hip: Secondary | ICD-10-CM | POA: Diagnosis not present

## 2017-05-08 DIAGNOSIS — M5136 Other intervertebral disc degeneration, lumbar region: Secondary | ICD-10-CM | POA: Diagnosis not present

## 2017-05-15 DIAGNOSIS — R29898 Other symptoms and signs involving the musculoskeletal system: Secondary | ICD-10-CM | POA: Diagnosis not present

## 2017-05-15 DIAGNOSIS — R262 Difficulty in walking, not elsewhere classified: Secondary | ICD-10-CM | POA: Diagnosis not present

## 2017-05-15 DIAGNOSIS — M25551 Pain in right hip: Secondary | ICD-10-CM | POA: Diagnosis not present

## 2017-05-15 DIAGNOSIS — M5136 Other intervertebral disc degeneration, lumbar region: Secondary | ICD-10-CM | POA: Diagnosis not present

## 2017-05-22 DIAGNOSIS — Z96641 Presence of right artificial hip joint: Secondary | ICD-10-CM | POA: Diagnosis not present

## 2017-05-22 DIAGNOSIS — M5136 Other intervertebral disc degeneration, lumbar region: Secondary | ICD-10-CM | POA: Diagnosis not present

## 2017-05-22 DIAGNOSIS — M25551 Pain in right hip: Secondary | ICD-10-CM | POA: Diagnosis not present

## 2017-05-22 DIAGNOSIS — R262 Difficulty in walking, not elsewhere classified: Secondary | ICD-10-CM | POA: Diagnosis not present

## 2017-05-24 ENCOUNTER — Other Ambulatory Visit: Payer: Self-pay | Admitting: Neurosurgery

## 2017-05-24 DIAGNOSIS — Q283 Other malformations of cerebral vessels: Principal | ICD-10-CM

## 2017-05-24 DIAGNOSIS — Q281 Other malformations of precerebral vessels: Secondary | ICD-10-CM

## 2017-05-29 DIAGNOSIS — M5136 Other intervertebral disc degeneration, lumbar region: Secondary | ICD-10-CM | POA: Diagnosis not present

## 2017-05-29 DIAGNOSIS — R29898 Other symptoms and signs involving the musculoskeletal system: Secondary | ICD-10-CM | POA: Diagnosis not present

## 2017-05-29 DIAGNOSIS — R262 Difficulty in walking, not elsewhere classified: Secondary | ICD-10-CM | POA: Diagnosis not present

## 2017-05-29 DIAGNOSIS — M25551 Pain in right hip: Secondary | ICD-10-CM | POA: Diagnosis not present

## 2017-06-03 DIAGNOSIS — R6882 Decreased libido: Secondary | ICD-10-CM | POA: Diagnosis not present

## 2017-06-03 DIAGNOSIS — R5383 Other fatigue: Secondary | ICD-10-CM | POA: Diagnosis not present

## 2017-06-04 ENCOUNTER — Encounter: Payer: Self-pay | Admitting: Family Medicine

## 2017-06-04 ENCOUNTER — Ambulatory Visit (INDEPENDENT_AMBULATORY_CARE_PROVIDER_SITE_OTHER): Payer: BLUE CROSS/BLUE SHIELD | Admitting: Family Medicine

## 2017-06-04 VITALS — BP 100/58 | HR 76 | Temp 97.2°F | Wt 195.0 lb

## 2017-06-04 DIAGNOSIS — E039 Hypothyroidism, unspecified: Secondary | ICD-10-CM

## 2017-06-04 DIAGNOSIS — I10 Essential (primary) hypertension: Secondary | ICD-10-CM

## 2017-06-04 DIAGNOSIS — M797 Fibromyalgia: Secondary | ICD-10-CM

## 2017-06-04 MED ORDER — LOSARTAN POTASSIUM-HCTZ 50-12.5 MG PO TABS: 1.0000 | ORAL_TABLET | Freq: Every day | ORAL | 3 refills | Status: DC

## 2017-06-04 MED ORDER — LIDOCAINE 5 % EX PTCH: 1.0000 | MEDICATED_PATCH | CUTANEOUS | 3 refills | Status: DC

## 2017-06-04 MED ORDER — METOPROLOL TARTRATE 50 MG PO TABS: 50.0000 mg | ORAL_TABLET | Freq: Two times a day (BID) | ORAL | 3 refills | Status: DC

## 2017-06-04 MED ORDER — TIZANIDINE HCL 4 MG PO CAPS: 4.0000 mg | ORAL_CAPSULE | Freq: Two times a day (BID) | ORAL | 3 refills | Status: DC | PRN

## 2017-06-04 MED ORDER — NATURE-THROID 32.5 MG PO TABS: ORAL_TABLET | ORAL | 3 refills | Status: DC

## 2017-06-04 MED ORDER — AMITRIPTYLINE HCL 10 MG PO TABS: 40.0000 mg | ORAL_TABLET | Freq: Every day | ORAL | 3 refills | Status: DC

## 2017-06-04 NOTE — Progress Notes (Signed)
Subjective:    Patient ID: Abigail Gomez, female    DOB: [birth year], 52 y.o.   MRN: [id number]  Chief Complaint  Patient presents with  . Follow-up    HPI Patient was seen today for f/u on chronic issues.  Pt has seen/is seeing multiple specialist for her ongoing chronic conditions.  Pt has tried to find a new Rheumatologist taking pt's with fibro, but has not been successful.  Pt thought she was seeing a Publishing rights manager at [facility], but it turns out she was seen in pain clinic.  Pt does not want to be on chronic pain meds.  Pt has also tried to find an ID physician for her h/o Lyme dz, but was told that she technically did not have lyme dz as she was positive for 3/5 bands and not 5/5.  Pt has recently had trouble seeing Dr. Percell Locus? in [place].  Pt had been seeing her for fibro but states the provider is going through some personal issues and has not really been available.  Pt states she has been trying to have FMLA paperwork completed x 3 months.  She has also been trying to get refills on meds from this provider.  Pt is requesting refills on Amitriptyline, Nature-throid, Metoprolol, losartan-HCTZ.  Past Medical History:  Diagnosis Date  . Allergy   . Anemia   . Anxiety   . Depression   . Disease    lymes disease  . Fibromyalgia   . H/o Lyme disease   . H/O sickle cell trait   . History of pelvic fracture   . History of urinary incontinence   . Hypertension   . Hypothyroidism   . Insomnia   . Labral tear of hip joint    Right Hip  . Migraine   . Osteoarthritis   . RLS (restless legs syndrome)   . Sleep apnea    cpap  . Spinal stenosis   . Thyroid disease   . TMJ syndrome   . Vitamin D deficiency     Allergies  Allergen Reactions  . Peanut Oil Anaphylaxis  . Penicillins Other (See Comments) and Anaphylaxis    Syncope Has patient had a PCN reaction causing immediate rash, facial/tongue/throat swelling, SOB or lightheadedness with hypotension:Yes Has  patient had a PCN reaction causing severe rash involving mucus membranes or skin necrosis:No Has patient had a PCN reaction that required hospitalization:No Has patient had a PCN reaction occurring within the last 10 years:No If all of the above answers are "NO", then may proceed with Cephalosporin use.   . Food     Nuts-itching/swelling Bananas-itching/swelling  . Lactose Intolerance (Gi) Other (See Comments)    G.I. upset  . Lactulose Other (See Comments)    G.I. upset  . Lisinopril Cough  . Other Itching    Nuts-itching/swelling Bananas-itching/swelling  . Oxycodone-Acetaminophen Itching and Other (See Comments)    ROS General: Denies fever, chills, night sweats, changes in weight, changes in appetite HEENT: Denies headaches, ear pain, changes in vision, rhinorrhea, sore throat CV: Denies CP, palpitations, SOB, orthopnea Pulm: Denies SOB, cough, wheezing GI: Denies abdominal pain, nausea, vomiting, diarrhea, constipation GU: Denies dysuria, hematuria, frequency, vaginal discharge Msk: Denies muscle cramps  +hip pain, diffuse pain Neuro: Denies weakness, numbness, tingling Skin: Denies rashes, bruising Psych: Denies depression, anxiety, hallucinations     Objective:    Blood pressure (!) 100/58, pulse 76, temperature (!) 97.2 F (36.2 C), temperature source Oral, weight 195 lb (88.5 kg), SpO2 98 %.  Gen. Pleasant, well-nourished, in no distress, normal affect   HEENT: [place]/AT, face symmetric, no scleral icterus, PERRLA, nares patent without drainage Lungs: no accessory muscle use Cardiovascular: RRR, no peripheral edema Neuro:  A&Ox3, CN II-XII intact, normal gait Skin:  Warm, no lesions/ rash   Wt Readings from Last 3 Encounters:  06/04/17 195 lb (88.5 kg)  04/05/17 169 lb (76.7 kg)  02/28/17 183 lb 6.4 oz (83.2 kg)    Lab Results  Component Value Date   WBC 4.7 08/31/2016   HGB 10.9 (L) 08/31/2016   HCT 32.3 (L) 08/31/2016   PLT 183.0 08/31/2016   GLUCOSE  104 (H) 08/31/2016   CHOL 149 08/31/2016   TRIG 50.0 08/31/2016   HDL 49.00 08/31/2016   LDLCALC 90 08/31/2016   ALT 13 08/31/2016   AST 20 08/31/2016   NA 141 08/31/2016   K 4.5 08/31/2016   CL 106 08/31/2016   CREATININE 0.91 08/31/2016   BUN 11 08/31/2016   CO2 29 08/31/2016   TSH 2.04 08/31/2016   HGBA1C 5.1 08/23/2014    Assessment/Plan:  Fibromyalgia -Discussed using ibuprofen during fibromyalgia flare - Plan: lidocaine (LIDODERM) 5 %, amitriptyline (ELAVIL) 10 MG tablet, tiZANidine (ZANAFLEX) 4 MG capsule  Essential hypertension -Controlled -Continue current medications -Lifestyle modifications encouraged - Plan: losartan-hydrochlorothiazide (HYZAAR) 50-12.5 MG tablet, metoprolol tartrate (LOPRESSOR) 50 MG tablet  Hypothyroidism, unspecified type - Plan: NATURE-THROID 32.5 MG tablet -Check TSH at next OFV  Follow-up PRN.  Patient is unable to get FMLA paperwork from her  provider this provider is willing to complete it.  Abbe AmsterdamShannon Rasool Rommel, MD

## 2017-06-05 ENCOUNTER — Encounter: Payer: Self-pay | Admitting: Family Medicine

## 2017-06-10 DIAGNOSIS — N3941 Urge incontinence: Secondary | ICD-10-CM | POA: Diagnosis not present

## 2017-06-10 DIAGNOSIS — N949 Unspecified condition associated with female genital organs and menstrual cycle: Secondary | ICD-10-CM | POA: Diagnosis not present

## 2017-06-10 DIAGNOSIS — N393 Stress incontinence (female) (male): Secondary | ICD-10-CM | POA: Diagnosis not present

## 2017-06-10 DIAGNOSIS — R32 Unspecified urinary incontinence: Secondary | ICD-10-CM | POA: Diagnosis not present

## 2017-06-13 ENCOUNTER — Ambulatory Visit
Admission: RE | Admit: 2017-06-13 | Discharge: 2017-06-13 | Disposition: A | Discharge status: Home / Self Care | Payer: BLUE CROSS/BLUE SHIELD | Source: Ambulatory Visit | Attending: Neurosurgery | Admitting: Neurosurgery

## 2017-06-13 DIAGNOSIS — Q283 Other malformations of cerebral vessels: Principal | ICD-10-CM

## 2017-06-13 DIAGNOSIS — R51 Headache: Secondary | ICD-10-CM | POA: Diagnosis not present

## 2017-06-13 DIAGNOSIS — Q281 Other malformations of precerebral vessels: Secondary | ICD-10-CM

## 2017-06-14 DIAGNOSIS — M48062 Spinal stenosis, lumbar region with neurogenic claudication: Secondary | ICD-10-CM | POA: Diagnosis not present

## 2017-06-14 DIAGNOSIS — M47816 Spondylosis without myelopathy or radiculopathy, lumbar region: Secondary | ICD-10-CM | POA: Diagnosis not present

## 2017-06-14 DIAGNOSIS — Q283 Other malformations of cerebral vessels: Secondary | ICD-10-CM | POA: Diagnosis not present

## 2017-06-14 DIAGNOSIS — M5136 Other intervertebral disc degeneration, lumbar region: Secondary | ICD-10-CM | POA: Diagnosis not present

## 2017-06-21 DIAGNOSIS — M5136 Other intervertebral disc degeneration, lumbar region: Secondary | ICD-10-CM | POA: Diagnosis not present

## 2017-06-21 DIAGNOSIS — R262 Difficulty in walking, not elsewhere classified: Secondary | ICD-10-CM | POA: Diagnosis not present

## 2017-06-21 DIAGNOSIS — R29898 Other symptoms and signs involving the musculoskeletal system: Secondary | ICD-10-CM | POA: Diagnosis not present

## 2017-06-21 DIAGNOSIS — M25551 Pain in right hip: Secondary | ICD-10-CM | POA: Diagnosis not present

## 2017-06-27 DIAGNOSIS — G4709 Other insomnia: Secondary | ICD-10-CM | POA: Diagnosis not present

## 2017-06-27 DIAGNOSIS — M545 Low back pain: Secondary | ICD-10-CM | POA: Diagnosis not present

## 2017-06-27 DIAGNOSIS — F411 Generalized anxiety disorder: Secondary | ICD-10-CM | POA: Diagnosis not present

## 2017-06-27 DIAGNOSIS — M797 Fibromyalgia: Secondary | ICD-10-CM | POA: Diagnosis not present

## 2017-06-27 DIAGNOSIS — M25551 Pain in right hip: Secondary | ICD-10-CM | POA: Diagnosis not present

## 2017-07-02 DIAGNOSIS — M25551 Pain in right hip: Secondary | ICD-10-CM | POA: Diagnosis not present

## 2017-07-02 DIAGNOSIS — M5136 Other intervertebral disc degeneration, lumbar region: Secondary | ICD-10-CM | POA: Diagnosis not present

## 2017-07-02 DIAGNOSIS — R262 Difficulty in walking, not elsewhere classified: Secondary | ICD-10-CM | POA: Diagnosis not present

## 2017-07-02 DIAGNOSIS — M25552 Pain in left hip: Secondary | ICD-10-CM | POA: Diagnosis not present

## 2017-07-02 DIAGNOSIS — R29898 Other symptoms and signs involving the musculoskeletal system: Secondary | ICD-10-CM | POA: Diagnosis not present

## 2017-07-08 ENCOUNTER — Telehealth: Payer: Self-pay | Admitting: Family Medicine

## 2017-07-08 NOTE — Telephone Encounter (Signed)
Copied from CRM [id number]. Topic: General - Other >> Jul 08, 2017  9:18 AM Percival Spanish wrote:  [facility] has been taking NATURE-THROID 32.5 MG tablet  it is on back order and the pharmacy would like to switch pt to ARMOUR 32.5 and would like to know if this is ok     [facility] [address] [facility] Todd  [phone]

## 2017-07-08 NOTE — Telephone Encounter (Signed)
Please see below message    Please advise

## 2017-07-09 NOTE — Telephone Encounter (Signed)
That's fine with me. Is the pt aware of this as she has been on this medication for a while?

## 2017-07-09 NOTE — Telephone Encounter (Signed)
Pt was called and a detailed voice message was left on voicemail.

## 2017-07-09 NOTE — Telephone Encounter (Signed)
Spoke with pharmacist Summer from [facility] Dr Salomon Fick agrees with mediation change to ARMOUR 30 mg, one tab PO QD.

## 2017-07-17 DIAGNOSIS — F329 Major depressive disorder, single episode, unspecified: Secondary | ICD-10-CM | POA: Diagnosis not present

## 2017-07-17 DIAGNOSIS — M797 Fibromyalgia: Secondary | ICD-10-CM | POA: Diagnosis not present

## 2017-07-17 DIAGNOSIS — M1632 Unilateral osteoarthritis resulting from hip dysplasia, left hip: Secondary | ICD-10-CM | POA: Diagnosis not present

## 2017-07-17 DIAGNOSIS — F419 Anxiety disorder, unspecified: Secondary | ICD-10-CM | POA: Diagnosis not present

## 2017-07-17 DIAGNOSIS — M25551 Pain in right hip: Secondary | ICD-10-CM | POA: Diagnosis not present

## 2017-07-17 DIAGNOSIS — Z8619 Personal history of other infectious and parasitic diseases: Secondary | ICD-10-CM | POA: Diagnosis not present

## 2017-07-17 DIAGNOSIS — K219 Gastro-esophageal reflux disease without esophagitis: Secondary | ICD-10-CM | POA: Diagnosis not present

## 2017-07-17 DIAGNOSIS — D571 Sickle-cell disease without crisis: Secondary | ICD-10-CM | POA: Diagnosis not present

## 2017-07-18 DIAGNOSIS — M545 Low back pain: Secondary | ICD-10-CM | POA: Diagnosis not present

## 2017-07-18 DIAGNOSIS — M797 Fibromyalgia: Secondary | ICD-10-CM | POA: Diagnosis not present

## 2017-07-18 DIAGNOSIS — M25551 Pain in right hip: Secondary | ICD-10-CM | POA: Diagnosis not present

## 2017-07-18 DIAGNOSIS — F411 Generalized anxiety disorder: Secondary | ICD-10-CM | POA: Diagnosis not present

## 2017-07-18 DIAGNOSIS — G4709 Other insomnia: Secondary | ICD-10-CM | POA: Diagnosis not present

## 2017-07-24 DIAGNOSIS — M25551 Pain in right hip: Secondary | ICD-10-CM | POA: Diagnosis not present

## 2017-07-24 DIAGNOSIS — M5136 Other intervertebral disc degeneration, lumbar region: Secondary | ICD-10-CM | POA: Diagnosis not present

## 2017-07-24 DIAGNOSIS — R29898 Other symptoms and signs involving the musculoskeletal system: Secondary | ICD-10-CM | POA: Diagnosis not present

## 2017-07-24 DIAGNOSIS — R262 Difficulty in walking, not elsewhere classified: Secondary | ICD-10-CM | POA: Diagnosis not present

## 2017-07-25 DIAGNOSIS — M797 Fibromyalgia: Secondary | ICD-10-CM | POA: Diagnosis not present

## 2017-07-25 DIAGNOSIS — M25551 Pain in right hip: Secondary | ICD-10-CM | POA: Diagnosis not present

## 2017-07-25 DIAGNOSIS — G4709 Other insomnia: Secondary | ICD-10-CM | POA: Diagnosis not present

## 2017-07-25 DIAGNOSIS — M545 Low back pain: Secondary | ICD-10-CM | POA: Diagnosis not present

## 2017-08-14 DIAGNOSIS — Z6829 Body mass index (BMI) 29.0-29.9, adult: Secondary | ICD-10-CM | POA: Diagnosis not present

## 2017-08-14 DIAGNOSIS — Q283 Other malformations of cerebral vessels: Secondary | ICD-10-CM | POA: Diagnosis not present

## 2017-08-14 DIAGNOSIS — I10 Essential (primary) hypertension: Secondary | ICD-10-CM | POA: Diagnosis not present

## 2017-08-22 DIAGNOSIS — M25551 Pain in right hip: Secondary | ICD-10-CM | POA: Diagnosis not present

## 2017-08-22 DIAGNOSIS — M545 Low back pain: Secondary | ICD-10-CM | POA: Diagnosis not present

## 2017-08-22 DIAGNOSIS — G4709 Other insomnia: Secondary | ICD-10-CM | POA: Diagnosis not present

## 2017-08-22 DIAGNOSIS — M797 Fibromyalgia: Secondary | ICD-10-CM | POA: Diagnosis not present

## 2017-08-29 DIAGNOSIS — G4709 Other insomnia: Secondary | ICD-10-CM | POA: Diagnosis not present

## 2017-08-29 DIAGNOSIS — M797 Fibromyalgia: Secondary | ICD-10-CM | POA: Diagnosis not present

## 2017-08-29 DIAGNOSIS — M545 Low back pain: Secondary | ICD-10-CM | POA: Diagnosis not present

## 2017-08-29 DIAGNOSIS — M25551 Pain in right hip: Secondary | ICD-10-CM | POA: Diagnosis not present

## 2017-09-05 DIAGNOSIS — M545 Low back pain: Secondary | ICD-10-CM | POA: Diagnosis not present

## 2017-09-05 DIAGNOSIS — M797 Fibromyalgia: Secondary | ICD-10-CM | POA: Diagnosis not present

## 2017-09-05 DIAGNOSIS — G4709 Other insomnia: Secondary | ICD-10-CM | POA: Diagnosis not present

## 2017-09-05 DIAGNOSIS — M25551 Pain in right hip: Secondary | ICD-10-CM | POA: Diagnosis not present

## 2017-09-17 DIAGNOSIS — N393 Stress incontinence (female) (male): Secondary | ICD-10-CM | POA: Diagnosis not present

## 2017-09-17 DIAGNOSIS — M25551 Pain in right hip: Secondary | ICD-10-CM | POA: Diagnosis not present

## 2017-09-17 DIAGNOSIS — M545 Low back pain: Secondary | ICD-10-CM | POA: Diagnosis not present

## 2017-09-17 DIAGNOSIS — R102 Pelvic and perineal pain: Secondary | ICD-10-CM | POA: Diagnosis not present

## 2017-09-17 DIAGNOSIS — M797 Fibromyalgia: Secondary | ICD-10-CM | POA: Diagnosis not present

## 2017-09-17 DIAGNOSIS — G4709 Other insomnia: Secondary | ICD-10-CM | POA: Diagnosis not present

## 2017-09-17 DIAGNOSIS — F411 Generalized anxiety disorder: Secondary | ICD-10-CM | POA: Diagnosis not present

## 2017-09-19 DIAGNOSIS — M797 Fibromyalgia: Secondary | ICD-10-CM | POA: Diagnosis not present

## 2017-09-19 DIAGNOSIS — M25551 Pain in right hip: Secondary | ICD-10-CM | POA: Diagnosis not present

## 2017-09-19 DIAGNOSIS — M545 Low back pain: Secondary | ICD-10-CM | POA: Diagnosis not present

## 2017-09-24 DIAGNOSIS — N393 Stress incontinence (female) (male): Secondary | ICD-10-CM | POA: Diagnosis not present

## 2017-09-24 DIAGNOSIS — R102 Pelvic and perineal pain: Secondary | ICD-10-CM | POA: Diagnosis not present

## 2017-09-24 DIAGNOSIS — M545 Low back pain: Secondary | ICD-10-CM | POA: Diagnosis not present

## 2017-09-24 DIAGNOSIS — M797 Fibromyalgia: Secondary | ICD-10-CM | POA: Diagnosis not present

## 2017-09-24 DIAGNOSIS — F411 Generalized anxiety disorder: Secondary | ICD-10-CM | POA: Diagnosis not present

## 2017-09-25 ENCOUNTER — Ambulatory Visit (INDEPENDENT_AMBULATORY_CARE_PROVIDER_SITE_OTHER): Payer: BLUE CROSS/BLUE SHIELD | Admitting: Family Medicine

## 2017-09-25 ENCOUNTER — Encounter: Payer: Self-pay | Admitting: Family Medicine

## 2017-09-25 VITALS — BP 108/78 | HR 64 | Temp 98.3°F | Wt 194.0 lb

## 2017-09-25 DIAGNOSIS — M797 Fibromyalgia: Secondary | ICD-10-CM

## 2017-09-25 DIAGNOSIS — M25551 Pain in right hip: Secondary | ICD-10-CM

## 2017-09-25 DIAGNOSIS — G8929 Other chronic pain: Secondary | ICD-10-CM

## 2017-09-25 MED ORDER — KETOROLAC TROMETHAMINE 60 MG/2ML IM SOLN
60.0000 mg | Freq: Once | INTRAMUSCULAR | Status: AC
  Administered 2017-09-25: 60 mg via INTRAMUSCULAR

## 2017-09-25 MED ORDER — PREDNISONE 20 MG PO TABS: 60.0000 mg | ORAL_TABLET | Freq: Every day | ORAL | 0 refills | Status: AC

## 2017-09-25 NOTE — Progress Notes (Signed)
Subjective:    Patient ID: Abigail Gomez, female    DOB: 1965-10-02, 52 y.o.   MRN: [id number]  No chief complaint on file.   HPI Patient was seen today for f/u.  Pt endorses fibromyalgia flare.  States she is having increased pain in her R hip, R leg, and low back.  Pt states she went to Ortho at [facility] for another opinion regarding her right hip osteoarthritis.  Was given an injection for right trochanteric bursitis which helped some.  Patient states she was told she may need another surgery on her hip.  Patient states the pain/discomfort in her right hip and leg are making it difficult for her to drive.  Patient was supposed to drive out of town for a meeting today.  Patient is still looking for rheumatologist to help manage her fibromyalgia, possible Lyme disease.  Patient was seeing a provider in [place] but this became expensive as she did not take insurance.  The provider in [place] also completed patient's FMLA however only listed 1 day per month for patient to miss for fibromyalgia flares.  Pt often has at least 3 days where she misses work 2/2 her chronic medical issues.  Pt has continued to manage her pain with acupuncture, massage, inversion therapy, yoga, etc.  Past Medical History:  Diagnosis Date  . Allergy   . Anemia   . Anxiety   . Depression   . Disease    lymes disease  . Fibromyalgia   . H/o Lyme disease   . H/O sickle cell trait   . History of pelvic fracture   . History of urinary incontinence   . Hypertension   . Hypothyroidism   . Insomnia   . Labral tear of hip joint    Right Hip  . Migraine   . Osteoarthritis   . RLS (restless legs syndrome)   . Sleep apnea    cpap  . Spinal stenosis   . Thyroid disease   . TMJ syndrome   . Vitamin D deficiency     Allergies  Allergen Reactions  . Peanut Oil Anaphylaxis  . Penicillins Other (See Comments) and Anaphylaxis    Syncope Has patient had a PCN reaction causing immediate rash,  facial/tongue/throat swelling, SOB or lightheadedness with hypotension:Yes Has patient had a PCN reaction causing severe rash involving mucus membranes or skin necrosis:No Has patient had a PCN reaction that required hospitalization:No Has patient had a PCN reaction occurring within the last 10 years:No If all of the above answers are "NO", then may proceed with Cephalosporin use.   . Food     Nuts-itching/swelling Bananas-itching/swelling  . Lactose Intolerance (Gi) Other (See Comments)    G.I. upset  . Lactulose Other (See Comments)    G.I. upset  . Lisinopril Cough  . Other Itching    Nuts-itching/swelling Bananas-itching/swelling  . Oxycodone-Acetaminophen Itching and Other (See Comments)    ROS General: Denies fever, chills, night sweats, changes in weight, changes in appetite HEENT: Denies headaches, ear pain, changes in vision, rhinorrhea, sore throat CV: Denies CP, palpitations, SOB, orthopnea Pulm: Denies SOB, cough, wheezing GI: Denies abdominal pain, nausea, vomiting, diarrhea, constipation GU: Denies dysuria, hematuria, frequency, vaginal discharge Msk: Denies muscle cramps  +joint pains Neuro: Denies weakness, numbness, tingling Skin: Denies rashes, bruising Psych: Denies depression, anxiety, hallucinations     Objective:    Blood pressure 108/78, pulse 64, temperature 98.3 F (36.8 C), temperature source Oral, weight 194 lb (88 kg), SpO2  99 %.   Gen. Pleasant, well-nourished, in no distress, normal affect   HEENT: [place]/AT, face symmetric, conjunctiva clear, no scleral icterus, PERRLA,  nares patent without drainage Lungs: no accessory muscle use, CTAB, no wheezes or rales Cardiovascular: RRR, no m/r/g, no peripheral edema Musculoskeletal: TTP of R hip, No deformities, no cyanosis or clubbing, normal tone Neuro:  A&Ox3, CN II-XII intact, normal gait   Wt Readings from Last 3 Encounters:  09/25/17 194 lb (88 kg)  06/04/17 195 lb (88.5 kg)  04/05/17 169 lb  (76.7 kg)    Lab Results  Component Value Date   WBC 4.7 08/31/2016   HGB 10.9 (L) 08/31/2016   HCT 32.3 (L) 08/31/2016   PLT 183.0 08/31/2016   GLUCOSE 104 (H) 08/31/2016   CHOL 149 08/31/2016   TRIG 50.0 08/31/2016   HDL 49.00 08/31/2016   LDLCALC 90 08/31/2016   ALT 13 08/31/2016   AST 20 08/31/2016   NA 141 08/31/2016   K 4.5 08/31/2016   CL 106 08/31/2016   CREATININE 0.91 08/31/2016   BUN 11 08/31/2016   CO2 29 08/31/2016   TSH 2.04 08/31/2016   HGBA1C 5.1 08/23/2014    Assessment/Plan:  Chronic right hip pain  - Plan: ketorolac (TORADOL) injection 60 mg, predniSONE (DELTASONE) 20 MG tablet  Fibromyalgia  - Plan: predniSONE (DELTASONE) 20 MG tablet  Pt given a note for work stating she was seen today for ongoing illness.  F/u prn  Abbe AmsterdamShannon Banks, MD

## 2017-10-01 DIAGNOSIS — F411 Generalized anxiety disorder: Secondary | ICD-10-CM | POA: Diagnosis not present

## 2017-10-01 DIAGNOSIS — R102 Pelvic and perineal pain: Secondary | ICD-10-CM | POA: Diagnosis not present

## 2017-10-01 DIAGNOSIS — N393 Stress incontinence (female) (male): Secondary | ICD-10-CM | POA: Diagnosis not present

## 2017-10-03 DIAGNOSIS — N393 Stress incontinence (female) (male): Secondary | ICD-10-CM | POA: Diagnosis not present

## 2017-10-03 DIAGNOSIS — F411 Generalized anxiety disorder: Secondary | ICD-10-CM | POA: Diagnosis not present

## 2017-10-03 DIAGNOSIS — R102 Pelvic and perineal pain: Secondary | ICD-10-CM | POA: Diagnosis not present

## 2017-10-08 DIAGNOSIS — M25551 Pain in right hip: Secondary | ICD-10-CM | POA: Diagnosis not present

## 2017-10-08 DIAGNOSIS — M545 Low back pain: Secondary | ICD-10-CM | POA: Diagnosis not present

## 2017-10-08 DIAGNOSIS — M797 Fibromyalgia: Secondary | ICD-10-CM | POA: Diagnosis not present

## 2017-10-08 DIAGNOSIS — F411 Generalized anxiety disorder: Secondary | ICD-10-CM | POA: Diagnosis not present

## 2017-10-09 DIAGNOSIS — M797 Fibromyalgia: Secondary | ICD-10-CM | POA: Diagnosis not present

## 2017-10-09 DIAGNOSIS — M545 Low back pain: Secondary | ICD-10-CM | POA: Diagnosis not present

## 2017-10-09 DIAGNOSIS — M25551 Pain in right hip: Secondary | ICD-10-CM | POA: Diagnosis not present

## 2017-10-09 DIAGNOSIS — F411 Generalized anxiety disorder: Secondary | ICD-10-CM | POA: Diagnosis not present

## 2017-10-10 DIAGNOSIS — M25551 Pain in right hip: Secondary | ICD-10-CM | POA: Diagnosis not present

## 2017-10-10 DIAGNOSIS — F411 Generalized anxiety disorder: Secondary | ICD-10-CM | POA: Diagnosis not present

## 2017-10-10 DIAGNOSIS — M797 Fibromyalgia: Secondary | ICD-10-CM | POA: Diagnosis not present

## 2017-10-10 DIAGNOSIS — M545 Low back pain: Secondary | ICD-10-CM | POA: Diagnosis not present

## 2017-10-25 ENCOUNTER — Telehealth: Payer: Self-pay

## 2017-10-25 NOTE — Telephone Encounter (Signed)
Called pt left a detailed pt to stop by the office and complete the FMLA form before [facility]

## 2017-10-28 DIAGNOSIS — N949 Unspecified condition associated with female genital organs and menstrual cycle: Secondary | ICD-10-CM | POA: Diagnosis not present

## 2017-10-28 DIAGNOSIS — N393 Stress incontinence (female) (male): Secondary | ICD-10-CM | POA: Diagnosis not present

## 2017-10-29 DIAGNOSIS — M797 Fibromyalgia: Secondary | ICD-10-CM | POA: Diagnosis not present

## 2017-10-29 DIAGNOSIS — F411 Generalized anxiety disorder: Secondary | ICD-10-CM | POA: Diagnosis not present

## 2017-10-29 DIAGNOSIS — M545 Low back pain: Secondary | ICD-10-CM | POA: Diagnosis not present

## 2017-10-29 DIAGNOSIS — M25551 Pain in right hip: Secondary | ICD-10-CM | POA: Diagnosis not present

## 2017-10-30 ENCOUNTER — Telehealth: Payer: Self-pay | Admitting: *Deleted

## 2017-10-30 ENCOUNTER — Ambulatory Visit (INDEPENDENT_AMBULATORY_CARE_PROVIDER_SITE_OTHER): Payer: BLUE CROSS/BLUE SHIELD | Admitting: Family Medicine

## 2017-10-30 ENCOUNTER — Encounter: Payer: Self-pay | Admitting: Family Medicine

## 2017-10-30 VITALS — BP 118/70 | HR 64 | Temp 98.4°F | Wt 194.0 lb

## 2017-10-30 DIAGNOSIS — J019 Acute sinusitis, unspecified: Secondary | ICD-10-CM | POA: Diagnosis not present

## 2017-10-30 MED ORDER — AZITHROMYCIN 250 MG PO TABS
ORAL_TABLET | ORAL | 0 refills | Status: DC
Start: 2017-10-30 — End: 2018-12-24

## 2017-10-30 NOTE — Telephone Encounter (Signed)
Copied from CRM [id number]. Topic: General - Other >> Oct 30, 2017  3:46 PM Marylen PontoMcneil, Ja-Kwan wrote: Reason for CRM: Pt states her FMLA paperwork was not completed correctly. Pt states the diagnosis is incorrect as well as the duration. Pt requests a call back. Cb# [phone]

## 2017-10-30 NOTE — Progress Notes (Signed)
Subjective:    Patient ID: Trellis MomentNicole N Vanbrocklin, female    DOB: 1966/03/05, [age] y.o.   MRN: [id number]  No chief complaint on file.   HPI Patient was seen today for acute concern.  Patient endorses headache, sore throat, productive cough, sinus pressure x1 week.  Symptoms initially started as cold-like with some sneezing/runny nose and a migraine headache that became progressively worse.  Patient woke up this morning with no voice.  She has tried Nasonex for her symptoms.  Patient endorses increased recent travel 2/2 work and several deaths in the family.  Past Medical History:  Diagnosis Date  . Allergy   . Anemia   . Anxiety   . Depression   . Disease    lymes disease  . Fibromyalgia   . H/o Lyme disease   . H/O sickle cell trait   . History of pelvic fracture   . History of urinary incontinence   . Hypertension   . Hypothyroidism   . Insomnia   . Labral tear of hip joint    Right Hip  . Migraine   . Osteoarthritis   . RLS (restless legs syndrome)   . Sleep apnea    cpap  . Spinal stenosis   . Thyroid disease   . TMJ syndrome   . Vitamin D deficiency     Allergies  Allergen Reactions  . Peanut Oil Anaphylaxis  . Penicillins Other (See Comments) and Anaphylaxis    Syncope Has patient had a PCN reaction causing immediate rash, facial/tongue/throat swelling, SOB or lightheadedness with hypotension:Yes Has patient had a PCN reaction causing severe rash involving mucus membranes or skin necrosis:No Has patient had a PCN reaction that required hospitalization:No Has patient had a PCN reaction occurring within the last 10 years:No If all of the above answers are "NO", then may proceed with Cephalosporin use.   . Food     Nuts-itching/swelling Bananas-itching/swelling  . Lactose Intolerance (Gi) Other (See Comments)    G.I. upset  . Lactulose Other (See Comments)    G.I. upset  . Lisinopril Cough  . Other Itching    Nuts-itching/swelling Bananas-itching/swelling  .  Oxycodone-Acetaminophen Itching and Other (See Comments)    ROS General: Denies fever, chills, night sweats, changes in weight, changes in appetite HEENT: Denies ear pain, changes in vision +HA/migraines, rhinorrhea, sneezing, sore throat, facial pain/pressure. CV: Denies CP, palpitations, SOB, orthopnea Pulm: Denies SOB, cough, wheezing GI: Denies abdominal pain, nausea, vomiting, diarrhea, constipation GU: Denies dysuria, hematuria, frequency, vaginal discharge Msk: Denies muscle cramps, joint pains Neuro: Denies weakness, numbness, tingling Skin: Denies rashes, bruising Psych: Denies depression, anxiety, hallucinations     Objective:    Blood pressure 118/70, pulse 64, temperature 98.4 F (36.9 C), temperature source Oral, weight 194 lb (88 kg), SpO2 98 %.   Gen. Pleasant, well-nourished, in no distress, normal affect   HEENT: [place]/AT, face symmetric, no scleral icterus, PERRLA, nares patent without drainage, pharynx without erythema or exudate.  TMs full bilaterally.  TTP of maxillary and frontal sinuses. Lungs: no accessory muscle use, CTAB, no wheezes or rales Cardiovascular: RRR, no m/r/g, no peripheral edema Neuro:  A&Ox3, CN II-XII intact, normal gait  Wt Readings from Last 3 Encounters:  10/30/17 194 lb (88 kg)  09/25/17 194 lb (88 kg)  06/04/17 195 lb (88.5 kg)    Lab Results  Component Value Date   WBC 4.7 08/31/2016   HGB 10.9 (L) 08/31/2016   HCT 32.3 (L) 08/31/2016   PLT 183.0 08/31/2016  GLUCOSE 104 (H) 08/31/2016   CHOL 149 08/31/2016   TRIG 50.0 08/31/2016   HDL 49.00 08/31/2016   LDLCALC 90 08/31/2016   ALT 13 08/31/2016   AST 20 08/31/2016   NA 141 08/31/2016   K 4.5 08/31/2016   CL 106 08/31/2016   CREATININE 0.91 08/31/2016   BUN 11 08/31/2016   CO2 29 08/31/2016   TSH 2.04 08/31/2016   HGBA1C 5.1 08/23/2014    Assessment/Plan:  Acute non-recurrent sinusitis, unspecified location  -Discussed supportive care such as staying hydrated,  gargling with warm salt water or Chloraseptic spray, continuing allergy nasal spray - Plan: azithromycin (ZITHROMAX) 250 MG tablet -Follow-up PRN for worsening or continued symptoms  FMLA paperwork completed.  Patient completed her part and forms will be faxed.  Abbe AmsterdamShannon Wah Sabic, MD

## 2017-10-31 DIAGNOSIS — M545 Low back pain: Secondary | ICD-10-CM | POA: Diagnosis not present

## 2017-10-31 DIAGNOSIS — M25551 Pain in right hip: Secondary | ICD-10-CM | POA: Diagnosis not present

## 2017-10-31 DIAGNOSIS — F411 Generalized anxiety disorder: Secondary | ICD-10-CM | POA: Diagnosis not present

## 2017-10-31 DIAGNOSIS — M797 Fibromyalgia: Secondary | ICD-10-CM | POA: Diagnosis not present

## 2017-10-31 NOTE — Telephone Encounter (Signed)
Spoke with pt states that she will drop off the medical records from her rheumatology for her disability paperwork

## 2017-11-05 NOTE — Telephone Encounter (Signed)
Pt dropped off the medical notes and dr Salomon FickBanks added on the needed information to the form and was faxed as requested.

## 2017-11-25 DIAGNOSIS — F605 Obsessive-compulsive personality disorder: Secondary | ICD-10-CM | POA: Diagnosis not present

## 2017-11-25 DIAGNOSIS — F411 Generalized anxiety disorder: Secondary | ICD-10-CM | POA: Diagnosis not present

## 2017-11-25 DIAGNOSIS — F333 Major depressive disorder, recurrent, severe with psychotic symptoms: Secondary | ICD-10-CM | POA: Diagnosis not present

## 2017-11-27 DIAGNOSIS — M7611 Psoas tendinitis, right hip: Secondary | ICD-10-CM | POA: Diagnosis not present

## 2017-11-27 DIAGNOSIS — Z96641 Presence of right artificial hip joint: Secondary | ICD-10-CM | POA: Diagnosis not present

## 2017-11-27 DIAGNOSIS — M7062 Trochanteric bursitis, left hip: Secondary | ICD-10-CM | POA: Diagnosis not present

## 2017-11-27 DIAGNOSIS — Z23 Encounter for immunization: Secondary | ICD-10-CM | POA: Diagnosis not present

## 2017-11-27 DIAGNOSIS — M25551 Pain in right hip: Secondary | ICD-10-CM | POA: Diagnosis not present

## 2017-12-03 DIAGNOSIS — Z96641 Presence of right artificial hip joint: Secondary | ICD-10-CM | POA: Diagnosis not present

## 2017-12-03 DIAGNOSIS — M25551 Pain in right hip: Secondary | ICD-10-CM | POA: Diagnosis not present

## 2017-12-26 DIAGNOSIS — F411 Generalized anxiety disorder: Secondary | ICD-10-CM | POA: Diagnosis not present

## 2017-12-26 DIAGNOSIS — M545 Low back pain: Secondary | ICD-10-CM | POA: Diagnosis not present

## 2017-12-26 DIAGNOSIS — M797 Fibromyalgia: Secondary | ICD-10-CM | POA: Diagnosis not present

## 2017-12-26 DIAGNOSIS — M25551 Pain in right hip: Secondary | ICD-10-CM | POA: Diagnosis not present

## 2017-12-30 DIAGNOSIS — M7611 Psoas tendinitis, right hip: Secondary | ICD-10-CM | POA: Diagnosis not present

## 2017-12-30 DIAGNOSIS — M7071 Other bursitis of hip, right hip: Secondary | ICD-10-CM | POA: Diagnosis not present

## 2017-12-30 DIAGNOSIS — M25451 Effusion, right hip: Secondary | ICD-10-CM | POA: Diagnosis not present

## 2018-01-02 ENCOUNTER — Telehealth: Payer: Self-pay

## 2018-01-02 DIAGNOSIS — M25551 Pain in right hip: Secondary | ICD-10-CM | POA: Diagnosis not present

## 2018-01-02 DIAGNOSIS — M797 Fibromyalgia: Secondary | ICD-10-CM | POA: Diagnosis not present

## 2018-01-02 DIAGNOSIS — F411 Generalized anxiety disorder: Secondary | ICD-10-CM | POA: Diagnosis not present

## 2018-01-02 DIAGNOSIS — M545 Low back pain: Secondary | ICD-10-CM | POA: Diagnosis not present

## 2018-01-02 IMAGING — RF DG C-ARM 61-120 MIN
1 series · 4 of 4 positions shown · non-contrast
Comparison: None in PACs

CLINICAL DATA: Right anterior approach hip jet right prosthesis
placement. Fluoro time is reported as 30 seconds.

EXAM:
OPERATIVE right HIP (WITH PELVIS IF PERFORMED) 4 VIEWS
TECHNIQUE: Fluoroscopic spot image(s) were submitted for interpretation
post-operatively.

[Series 1: run · 4 of 4 slices shown]
[im 1/4]
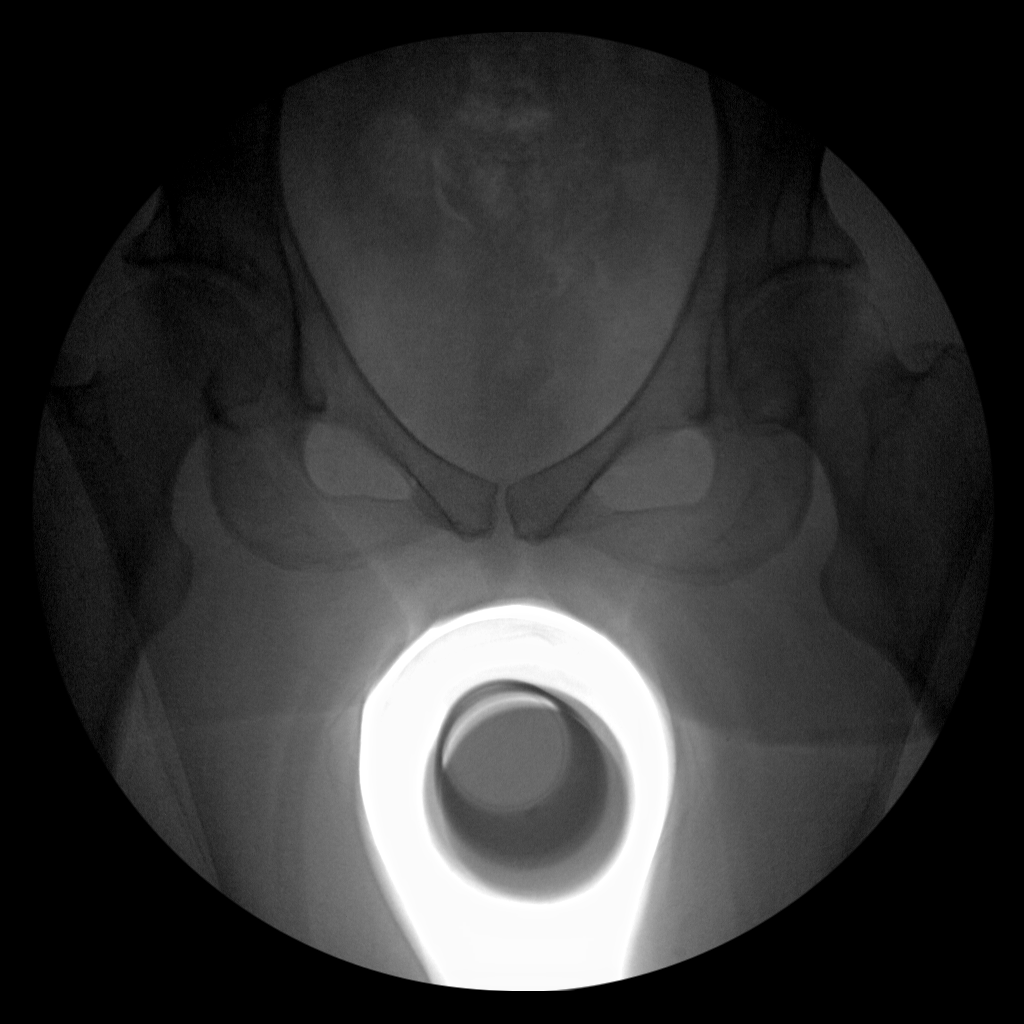
[im 2/4]
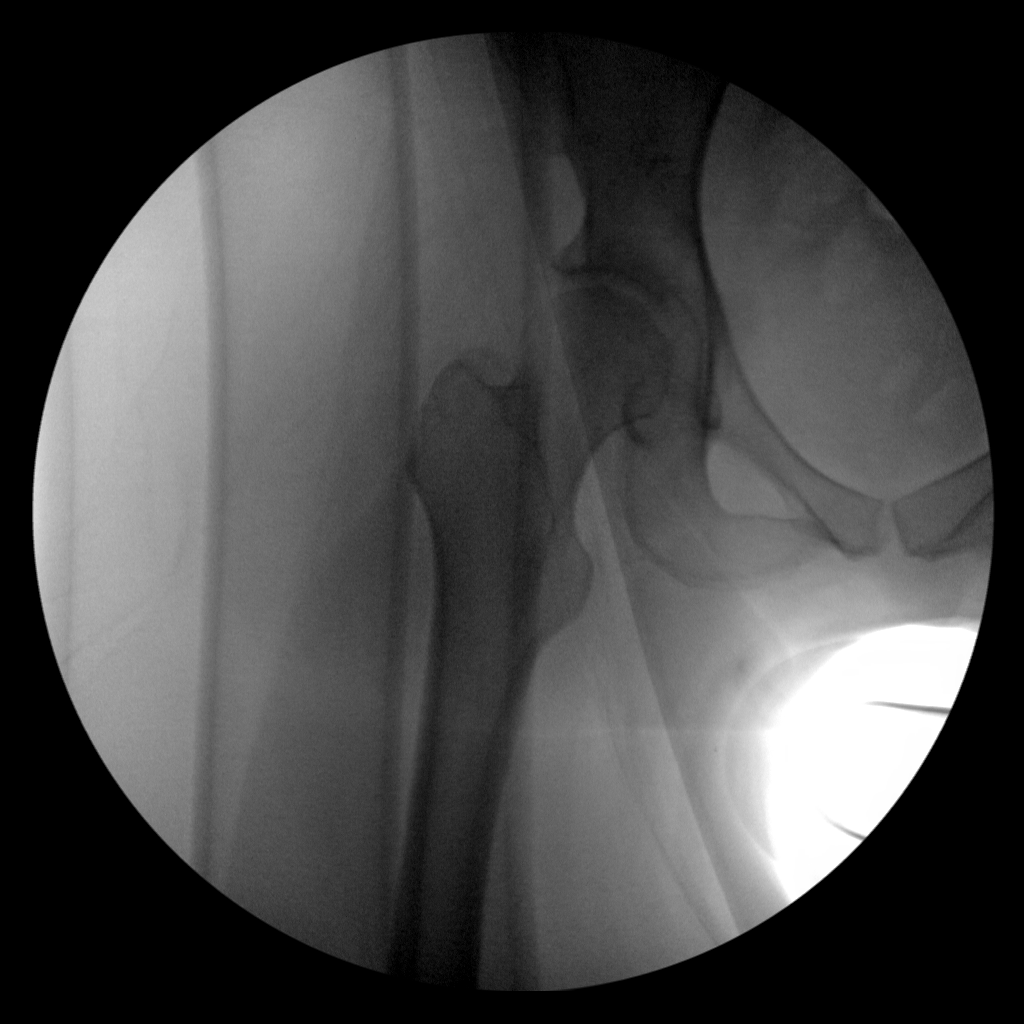
[im 3/4]
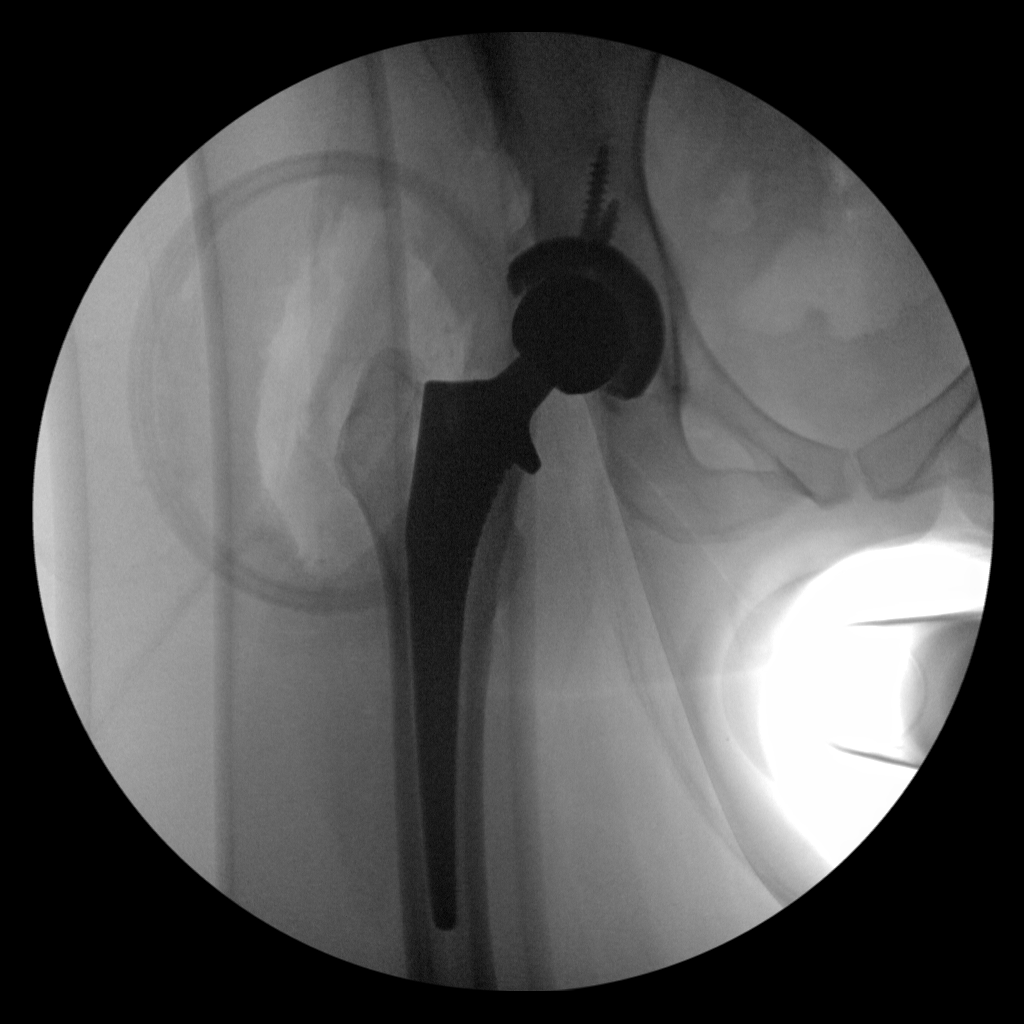
[im 4/4]
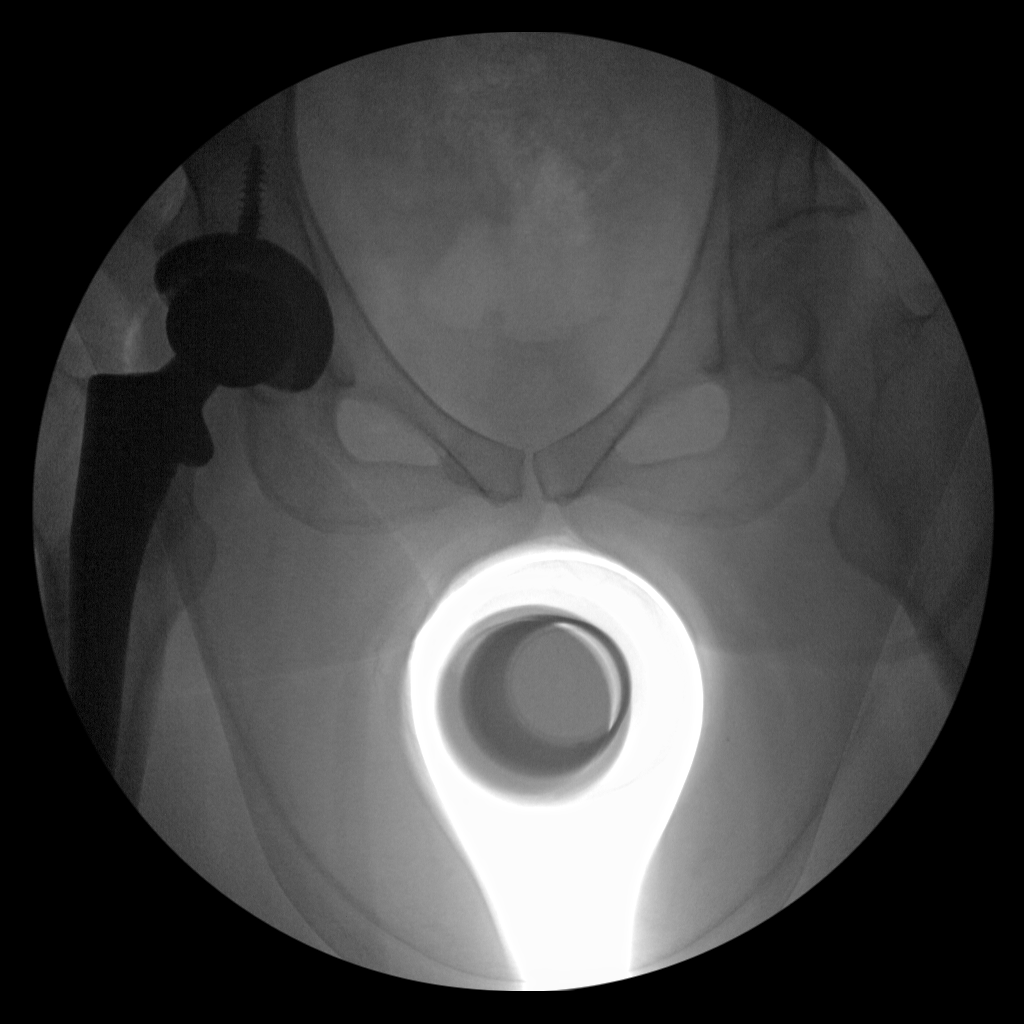

[4 of 4 positions shown; findings below may reference images not displayed]

FINDINGS: AP views of the right hip and visualized portions of the pelvis
reveal placement of a total joint prosthesis. Radiographic
positioning of the prosthetic components is good. The interface of
the prosthesis with the native bone appears normal.
IMPRESSION: There is no immediate postprocedure complication following anterior
approach right hip joint prosthesis placement.

## 2018-01-02 NOTE — Telephone Encounter (Signed)
Copied from CRM [id number]. Topic: General - Other >> Jan 02, 2018  2:25 PM Gerrianne Scale wrote: Reason for CRM: pt calling about fmla paper work that wasn't completed correctly  she would like to speak with dr banks assistant she would like a call back at [phone]

## 2018-01-03 ENCOUNTER — Other Ambulatory Visit: Payer: Self-pay | Admitting: Family Medicine

## 2018-01-03 NOTE — Telephone Encounter (Signed)
Spoke with pt state that the FMLA form needed some corrections and faxed back. Pt voiced understanding that the office will contact the pt when this is done

## 2018-01-08 DIAGNOSIS — M9904 Segmental and somatic dysfunction of sacral region: Secondary | ICD-10-CM | POA: Diagnosis not present

## 2018-01-08 DIAGNOSIS — M5431 Sciatica, right side: Secondary | ICD-10-CM | POA: Diagnosis not present

## 2018-01-08 DIAGNOSIS — M5441 Lumbago with sciatica, right side: Secondary | ICD-10-CM | POA: Diagnosis not present

## 2018-01-08 DIAGNOSIS — M62830 Muscle spasm of back: Secondary | ICD-10-CM | POA: Diagnosis not present

## 2018-01-13 NOTE — Telephone Encounter (Signed)
Called pt at 5.20pm  left a message for pt to return my call in regards to her request about her FMLA forms.

## 2018-01-15 NOTE — Telephone Encounter (Signed)
That's fine

## 2018-01-15 NOTE — Telephone Encounter (Signed)
Spoke with pt voiced understanding that the FMLA forms have been completed and refax. Pt requests to see a rheumatologist at [facility]. Ok to place referral

## 2018-01-16 ENCOUNTER — Other Ambulatory Visit: Payer: Self-pay | Admitting: Family Medicine

## 2018-01-16 DIAGNOSIS — Z96641 Presence of right artificial hip joint: Secondary | ICD-10-CM | POA: Diagnosis not present

## 2018-01-16 DIAGNOSIS — Z888 Allergy status to other drugs, medicaments and biological substances status: Secondary | ICD-10-CM | POA: Diagnosis not present

## 2018-01-16 DIAGNOSIS — M25551 Pain in right hip: Secondary | ICD-10-CM | POA: Diagnosis not present

## 2018-01-16 DIAGNOSIS — F411 Generalized anxiety disorder: Secondary | ICD-10-CM | POA: Diagnosis not present

## 2018-01-16 DIAGNOSIS — M545 Low back pain: Secondary | ICD-10-CM | POA: Diagnosis not present

## 2018-01-16 DIAGNOSIS — Z471 Aftercare following joint replacement surgery: Secondary | ICD-10-CM | POA: Diagnosis not present

## 2018-01-16 DIAGNOSIS — M797 Fibromyalgia: Secondary | ICD-10-CM

## 2018-01-16 DIAGNOSIS — Z885 Allergy status to narcotic agent status: Secondary | ICD-10-CM | POA: Diagnosis not present

## 2018-01-16 DIAGNOSIS — M1612 Unilateral primary osteoarthritis, left hip: Secondary | ICD-10-CM | POA: Diagnosis not present

## 2018-01-16 DIAGNOSIS — T8484XD Pain due to internal orthopedic prosthetic devices, implants and grafts, subsequent encounter: Secondary | ICD-10-CM | POA: Diagnosis not present

## 2018-01-16 DIAGNOSIS — Z886 Allergy status to analgesic agent status: Secondary | ICD-10-CM | POA: Diagnosis not present

## 2018-01-16 DIAGNOSIS — Z880 Allergy status to penicillin: Secondary | ICD-10-CM | POA: Diagnosis not present

## 2018-01-16 NOTE — Telephone Encounter (Signed)
Referral has been placed,pt aware

## 2018-01-23 DIAGNOSIS — M25551 Pain in right hip: Secondary | ICD-10-CM | POA: Diagnosis not present

## 2018-01-23 DIAGNOSIS — M797 Fibromyalgia: Secondary | ICD-10-CM | POA: Diagnosis not present

## 2018-01-23 DIAGNOSIS — M545 Low back pain: Secondary | ICD-10-CM | POA: Diagnosis not present

## 2018-01-23 DIAGNOSIS — F411 Generalized anxiety disorder: Secondary | ICD-10-CM | POA: Diagnosis not present

## 2018-01-24 ENCOUNTER — Telehealth: Payer: Self-pay | Admitting: Family Medicine

## 2018-01-24 DIAGNOSIS — Y792 Prosthetic and other implants, materials and accessory orthopedic devices associated with adverse incidents: Secondary | ICD-10-CM | POA: Diagnosis not present

## 2018-01-24 DIAGNOSIS — Z96649 Presence of unspecified artificial hip joint: Secondary | ICD-10-CM | POA: Diagnosis not present

## 2018-01-24 DIAGNOSIS — T8484XD Pain due to internal orthopedic prosthetic devices, implants and grafts, subsequent encounter: Secondary | ICD-10-CM | POA: Diagnosis not present

## 2018-01-24 DIAGNOSIS — Z96641 Presence of right artificial hip joint: Secondary | ICD-10-CM | POA: Diagnosis not present

## 2018-01-24 NOTE — Telephone Encounter (Signed)
Copied from CRM [id number]. Topic: Quick Communication - Rx Refill/Question >> Jan 24, 2018 11:18 AM Lorrine KinMcGee, Elmer Merwin B, NT wrote: **Patient calling and states that she is out of medication. States that the doctor that usually prescribes this, their office is closed. Would like to know if Dr Salomon FickBanks could send a refill in.**  Medication: Lunesta 3MG   Has the patient contacted their pharmacy? No. (Agent: If no, request that the patient contact the pharmacy for the refill.) (Agent: If yes, when and what did the pharmacy advise?)  Preferred Pharmacy (with phone number or street name): [id number] - [place], [place] - [address]  Agent: Please be advised that RX refills may take up to 3 business days. We ask that you follow-up with your pharmacy.

## 2018-01-27 DIAGNOSIS — R3915 Urgency of urination: Secondary | ICD-10-CM | POA: Diagnosis not present

## 2018-01-30 DIAGNOSIS — M545 Low back pain: Secondary | ICD-10-CM | POA: Diagnosis not present

## 2018-01-30 DIAGNOSIS — M25551 Pain in right hip: Secondary | ICD-10-CM | POA: Diagnosis not present

## 2018-01-30 DIAGNOSIS — M797 Fibromyalgia: Secondary | ICD-10-CM | POA: Diagnosis not present

## 2018-01-30 DIAGNOSIS — F411 Generalized anxiety disorder: Secondary | ICD-10-CM | POA: Diagnosis not present

## 2018-02-08 ENCOUNTER — Other Ambulatory Visit: Payer: Self-pay | Admitting: Family Medicine

## 2018-02-08 DIAGNOSIS — M797 Fibromyalgia: Secondary | ICD-10-CM

## 2018-02-10 NOTE — Telephone Encounter (Signed)
Last refilled was done on 06/04/2017 for 60 tablets with 3 refills and last OV was 10/30/2017,Ok to refill.

## 2018-02-12 DIAGNOSIS — M2550 Pain in unspecified joint: Secondary | ICD-10-CM | POA: Diagnosis not present

## 2018-02-12 DIAGNOSIS — M359 Systemic involvement of connective tissue, unspecified: Secondary | ICD-10-CM | POA: Diagnosis not present

## 2018-02-12 DIAGNOSIS — D8989 Other specified disorders involving the immune mechanism, not elsewhere classified: Secondary | ICD-10-CM | POA: Diagnosis not present

## 2018-02-12 DIAGNOSIS — M791 Myalgia, unspecified site: Secondary | ICD-10-CM | POA: Diagnosis not present

## 2018-02-12 DIAGNOSIS — R5383 Other fatigue: Secondary | ICD-10-CM | POA: Diagnosis not present

## 2018-02-13 DIAGNOSIS — M545 Low back pain: Secondary | ICD-10-CM | POA: Diagnosis not present

## 2018-02-13 DIAGNOSIS — F411 Generalized anxiety disorder: Secondary | ICD-10-CM | POA: Diagnosis not present

## 2018-02-13 DIAGNOSIS — M797 Fibromyalgia: Secondary | ICD-10-CM | POA: Diagnosis not present

## 2018-02-13 DIAGNOSIS — M25551 Pain in right hip: Secondary | ICD-10-CM | POA: Diagnosis not present

## 2018-02-20 DIAGNOSIS — M1612 Unilateral primary osteoarthritis, left hip: Secondary | ICD-10-CM | POA: Diagnosis not present

## 2018-02-26 DIAGNOSIS — M359 Systemic involvement of connective tissue, unspecified: Secondary | ICD-10-CM | POA: Diagnosis not present

## 2018-02-26 DIAGNOSIS — M2550 Pain in unspecified joint: Secondary | ICD-10-CM | POA: Diagnosis not present

## 2018-02-26 DIAGNOSIS — M791 Myalgia, unspecified site: Secondary | ICD-10-CM | POA: Diagnosis not present

## 2018-02-26 DIAGNOSIS — R5383 Other fatigue: Secondary | ICD-10-CM | POA: Diagnosis not present

## 2018-03-03 DIAGNOSIS — M3500 Sicca syndrome, unspecified: Secondary | ICD-10-CM | POA: Diagnosis not present

## 2018-03-03 DIAGNOSIS — F419 Anxiety disorder, unspecified: Secondary | ICD-10-CM | POA: Diagnosis not present

## 2018-03-03 DIAGNOSIS — F329 Major depressive disorder, single episode, unspecified: Secondary | ICD-10-CM | POA: Diagnosis not present

## 2018-03-03 DIAGNOSIS — M1612 Unilateral primary osteoarthritis, left hip: Secondary | ICD-10-CM | POA: Diagnosis not present

## 2018-03-03 DIAGNOSIS — F319 Bipolar disorder, unspecified: Secondary | ICD-10-CM | POA: Diagnosis not present

## 2018-03-03 DIAGNOSIS — Z96641 Presence of right artificial hip joint: Secondary | ICD-10-CM | POA: Diagnosis not present

## 2018-03-03 DIAGNOSIS — G43909 Migraine, unspecified, not intractable, without status migrainosus: Secondary | ICD-10-CM | POA: Diagnosis not present

## 2018-03-03 DIAGNOSIS — Z01818 Encounter for other preprocedural examination: Secondary | ICD-10-CM | POA: Diagnosis not present

## 2018-03-03 DIAGNOSIS — Z471 Aftercare following joint replacement surgery: Secondary | ICD-10-CM | POA: Diagnosis not present

## 2018-03-03 DIAGNOSIS — G8918 Other acute postprocedural pain: Secondary | ICD-10-CM | POA: Diagnosis not present

## 2018-03-03 DIAGNOSIS — E039 Hypothyroidism, unspecified: Secondary | ICD-10-CM | POA: Diagnosis not present

## 2018-03-03 DIAGNOSIS — I69319 Unspecified symptoms and signs involving cognitive functions following cerebral infarction: Secondary | ICD-10-CM | POA: Diagnosis not present

## 2018-03-03 DIAGNOSIS — I10 Essential (primary) hypertension: Secondary | ICD-10-CM | POA: Diagnosis not present

## 2018-03-03 DIAGNOSIS — Z96642 Presence of left artificial hip joint: Secondary | ICD-10-CM | POA: Diagnosis not present

## 2018-03-03 DIAGNOSIS — G4733 Obstructive sleep apnea (adult) (pediatric): Secondary | ICD-10-CM | POA: Diagnosis not present

## 2018-03-03 DIAGNOSIS — I69398 Other sequelae of cerebral infarction: Secondary | ICD-10-CM | POA: Diagnosis not present

## 2018-03-03 DIAGNOSIS — Z79899 Other long term (current) drug therapy: Secondary | ICD-10-CM | POA: Diagnosis not present

## 2018-03-03 DIAGNOSIS — M797 Fibromyalgia: Secondary | ICD-10-CM | POA: Diagnosis not present

## 2018-03-03 DIAGNOSIS — D573 Sickle-cell trait: Secondary | ICD-10-CM | POA: Diagnosis not present

## 2018-03-03 DIAGNOSIS — G2581 Restless legs syndrome: Secondary | ICD-10-CM | POA: Diagnosis not present

## 2018-03-07 DIAGNOSIS — F419 Anxiety disorder, unspecified: Secondary | ICD-10-CM | POA: Diagnosis not present

## 2018-03-07 DIAGNOSIS — G2581 Restless legs syndrome: Secondary | ICD-10-CM | POA: Diagnosis not present

## 2018-03-07 DIAGNOSIS — F329 Major depressive disorder, single episode, unspecified: Secondary | ICD-10-CM | POA: Diagnosis not present

## 2018-03-07 DIAGNOSIS — D649 Anemia, unspecified: Secondary | ICD-10-CM | POA: Diagnosis not present

## 2018-03-07 DIAGNOSIS — I10 Essential (primary) hypertension: Secondary | ICD-10-CM | POA: Diagnosis not present

## 2018-03-07 DIAGNOSIS — G43909 Migraine, unspecified, not intractable, without status migrainosus: Secondary | ICD-10-CM | POA: Diagnosis not present

## 2018-03-07 DIAGNOSIS — E039 Hypothyroidism, unspecified: Secondary | ICD-10-CM | POA: Diagnosis not present

## 2018-03-07 DIAGNOSIS — D573 Sickle-cell trait: Secondary | ICD-10-CM | POA: Diagnosis not present

## 2018-03-07 DIAGNOSIS — Z96643 Presence of artificial hip joint, bilateral: Secondary | ICD-10-CM | POA: Diagnosis not present

## 2018-03-07 DIAGNOSIS — Z9181 History of falling: Secondary | ICD-10-CM | POA: Diagnosis not present

## 2018-03-07 DIAGNOSIS — Z7982 Long term (current) use of aspirin: Secondary | ICD-10-CM | POA: Diagnosis not present

## 2018-03-07 DIAGNOSIS — Z8673 Personal history of transient ischemic attack (TIA), and cerebral infarction without residual deficits: Secondary | ICD-10-CM | POA: Diagnosis not present

## 2018-03-07 DIAGNOSIS — Z471 Aftercare following joint replacement surgery: Secondary | ICD-10-CM | POA: Diagnosis not present

## 2018-03-07 DIAGNOSIS — M797 Fibromyalgia: Secondary | ICD-10-CM | POA: Diagnosis not present

## 2018-03-07 DIAGNOSIS — M3500 Sicca syndrome, unspecified: Secondary | ICD-10-CM | POA: Diagnosis not present

## 2018-03-07 DIAGNOSIS — K219 Gastro-esophageal reflux disease without esophagitis: Secondary | ICD-10-CM | POA: Diagnosis not present

## 2018-03-14 DIAGNOSIS — Z471 Aftercare following joint replacement surgery: Secondary | ICD-10-CM | POA: Diagnosis not present

## 2018-03-14 DIAGNOSIS — Z96642 Presence of left artificial hip joint: Secondary | ICD-10-CM | POA: Diagnosis not present

## 2018-03-14 DIAGNOSIS — K219 Gastro-esophageal reflux disease without esophagitis: Secondary | ICD-10-CM | POA: Diagnosis not present

## 2018-03-14 DIAGNOSIS — Z7982 Long term (current) use of aspirin: Secondary | ICD-10-CM | POA: Diagnosis not present

## 2018-03-14 DIAGNOSIS — F329 Major depressive disorder, single episode, unspecified: Secondary | ICD-10-CM | POA: Diagnosis not present

## 2018-03-14 DIAGNOSIS — M797 Fibromyalgia: Secondary | ICD-10-CM | POA: Diagnosis not present

## 2018-03-14 DIAGNOSIS — G4730 Sleep apnea, unspecified: Secondary | ICD-10-CM | POA: Diagnosis not present

## 2018-03-14 DIAGNOSIS — M1990 Unspecified osteoarthritis, unspecified site: Secondary | ICD-10-CM | POA: Diagnosis not present

## 2018-03-14 DIAGNOSIS — F419 Anxiety disorder, unspecified: Secondary | ICD-10-CM | POA: Diagnosis not present

## 2018-03-14 DIAGNOSIS — I728 Aneurysm of other specified arteries: Secondary | ICD-10-CM | POA: Diagnosis not present

## 2018-03-14 DIAGNOSIS — I820 Budd-Chiari syndrome: Secondary | ICD-10-CM | POA: Diagnosis not present

## 2018-03-17 DIAGNOSIS — M797 Fibromyalgia: Secondary | ICD-10-CM | POA: Diagnosis not present

## 2018-03-17 DIAGNOSIS — F329 Major depressive disorder, single episode, unspecified: Secondary | ICD-10-CM | POA: Diagnosis not present

## 2018-03-17 DIAGNOSIS — I728 Aneurysm of other specified arteries: Secondary | ICD-10-CM | POA: Diagnosis not present

## 2018-03-17 DIAGNOSIS — Z471 Aftercare following joint replacement surgery: Secondary | ICD-10-CM | POA: Diagnosis not present

## 2018-03-17 DIAGNOSIS — G4730 Sleep apnea, unspecified: Secondary | ICD-10-CM | POA: Diagnosis not present

## 2018-03-17 DIAGNOSIS — Z7982 Long term (current) use of aspirin: Secondary | ICD-10-CM | POA: Diagnosis not present

## 2018-03-17 DIAGNOSIS — I820 Budd-Chiari syndrome: Secondary | ICD-10-CM | POA: Diagnosis not present

## 2018-03-17 DIAGNOSIS — Z96642 Presence of left artificial hip joint: Secondary | ICD-10-CM | POA: Diagnosis not present

## 2018-03-17 DIAGNOSIS — F419 Anxiety disorder, unspecified: Secondary | ICD-10-CM | POA: Diagnosis not present

## 2018-03-17 DIAGNOSIS — K219 Gastro-esophageal reflux disease without esophagitis: Secondary | ICD-10-CM | POA: Diagnosis not present

## 2018-03-17 DIAGNOSIS — M1990 Unspecified osteoarthritis, unspecified site: Secondary | ICD-10-CM | POA: Diagnosis not present

## 2018-03-19 DIAGNOSIS — K219 Gastro-esophageal reflux disease without esophagitis: Secondary | ICD-10-CM | POA: Diagnosis not present

## 2018-03-19 DIAGNOSIS — Z471 Aftercare following joint replacement surgery: Secondary | ICD-10-CM | POA: Diagnosis not present

## 2018-03-19 DIAGNOSIS — Z96642 Presence of left artificial hip joint: Secondary | ICD-10-CM | POA: Diagnosis not present

## 2018-03-19 DIAGNOSIS — M1990 Unspecified osteoarthritis, unspecified site: Secondary | ICD-10-CM | POA: Diagnosis not present

## 2018-03-19 DIAGNOSIS — F419 Anxiety disorder, unspecified: Secondary | ICD-10-CM | POA: Diagnosis not present

## 2018-03-19 DIAGNOSIS — G4730 Sleep apnea, unspecified: Secondary | ICD-10-CM | POA: Diagnosis not present

## 2018-03-19 DIAGNOSIS — Z7982 Long term (current) use of aspirin: Secondary | ICD-10-CM | POA: Diagnosis not present

## 2018-03-19 DIAGNOSIS — F329 Major depressive disorder, single episode, unspecified: Secondary | ICD-10-CM | POA: Diagnosis not present

## 2018-03-19 DIAGNOSIS — I728 Aneurysm of other specified arteries: Secondary | ICD-10-CM | POA: Diagnosis not present

## 2018-03-19 DIAGNOSIS — I820 Budd-Chiari syndrome: Secondary | ICD-10-CM | POA: Diagnosis not present

## 2018-03-19 DIAGNOSIS — M797 Fibromyalgia: Secondary | ICD-10-CM | POA: Diagnosis not present

## 2018-03-24 DIAGNOSIS — K219 Gastro-esophageal reflux disease without esophagitis: Secondary | ICD-10-CM | POA: Diagnosis not present

## 2018-03-24 DIAGNOSIS — I728 Aneurysm of other specified arteries: Secondary | ICD-10-CM | POA: Diagnosis not present

## 2018-03-24 DIAGNOSIS — G4730 Sleep apnea, unspecified: Secondary | ICD-10-CM | POA: Diagnosis not present

## 2018-03-24 DIAGNOSIS — F329 Major depressive disorder, single episode, unspecified: Secondary | ICD-10-CM | POA: Diagnosis not present

## 2018-03-24 DIAGNOSIS — M1990 Unspecified osteoarthritis, unspecified site: Secondary | ICD-10-CM | POA: Diagnosis not present

## 2018-03-24 DIAGNOSIS — F419 Anxiety disorder, unspecified: Secondary | ICD-10-CM | POA: Diagnosis not present

## 2018-03-24 DIAGNOSIS — I820 Budd-Chiari syndrome: Secondary | ICD-10-CM | POA: Diagnosis not present

## 2018-03-24 DIAGNOSIS — Z7982 Long term (current) use of aspirin: Secondary | ICD-10-CM | POA: Diagnosis not present

## 2018-03-24 DIAGNOSIS — M797 Fibromyalgia: Secondary | ICD-10-CM | POA: Diagnosis not present

## 2018-03-24 DIAGNOSIS — Z96642 Presence of left artificial hip joint: Secondary | ICD-10-CM | POA: Diagnosis not present

## 2018-03-24 DIAGNOSIS — Z471 Aftercare following joint replacement surgery: Secondary | ICD-10-CM | POA: Diagnosis not present

## 2018-03-26 DIAGNOSIS — Z471 Aftercare following joint replacement surgery: Secondary | ICD-10-CM | POA: Diagnosis not present

## 2018-03-26 DIAGNOSIS — I820 Budd-Chiari syndrome: Secondary | ICD-10-CM | POA: Diagnosis not present

## 2018-03-26 DIAGNOSIS — I728 Aneurysm of other specified arteries: Secondary | ICD-10-CM | POA: Diagnosis not present

## 2018-03-26 DIAGNOSIS — F419 Anxiety disorder, unspecified: Secondary | ICD-10-CM | POA: Diagnosis not present

## 2018-03-26 DIAGNOSIS — F329 Major depressive disorder, single episode, unspecified: Secondary | ICD-10-CM | POA: Diagnosis not present

## 2018-03-26 DIAGNOSIS — K219 Gastro-esophageal reflux disease without esophagitis: Secondary | ICD-10-CM | POA: Diagnosis not present

## 2018-03-26 DIAGNOSIS — Z96642 Presence of left artificial hip joint: Secondary | ICD-10-CM | POA: Diagnosis not present

## 2018-03-26 DIAGNOSIS — Z7982 Long term (current) use of aspirin: Secondary | ICD-10-CM | POA: Diagnosis not present

## 2018-03-26 DIAGNOSIS — M1990 Unspecified osteoarthritis, unspecified site: Secondary | ICD-10-CM | POA: Diagnosis not present

## 2018-03-26 DIAGNOSIS — M797 Fibromyalgia: Secondary | ICD-10-CM | POA: Diagnosis not present

## 2018-03-26 DIAGNOSIS — G4730 Sleep apnea, unspecified: Secondary | ICD-10-CM | POA: Diagnosis not present

## 2018-04-08 DIAGNOSIS — F605 Obsessive-compulsive personality disorder: Secondary | ICD-10-CM | POA: Diagnosis not present

## 2018-04-08 DIAGNOSIS — F411 Generalized anxiety disorder: Secondary | ICD-10-CM | POA: Diagnosis not present

## 2018-04-08 DIAGNOSIS — F333 Major depressive disorder, recurrent, severe with psychotic symptoms: Secondary | ICD-10-CM | POA: Diagnosis not present

## 2018-04-17 DIAGNOSIS — Z96642 Presence of left artificial hip joint: Secondary | ICD-10-CM | POA: Diagnosis not present

## 2018-04-17 DIAGNOSIS — Z471 Aftercare following joint replacement surgery: Secondary | ICD-10-CM | POA: Diagnosis not present

## 2018-04-18 ENCOUNTER — Ambulatory Visit: Payer: BLUE CROSS/BLUE SHIELD | Admitting: Internal Medicine

## 2018-04-18 DIAGNOSIS — Z0289 Encounter for other administrative examinations: Secondary | ICD-10-CM

## 2018-05-07 DIAGNOSIS — Z96642 Presence of left artificial hip joint: Secondary | ICD-10-CM | POA: Diagnosis not present

## 2018-05-07 DIAGNOSIS — R262 Difficulty in walking, not elsewhere classified: Secondary | ICD-10-CM | POA: Diagnosis not present

## 2018-05-07 DIAGNOSIS — M25552 Pain in left hip: Secondary | ICD-10-CM | POA: Diagnosis not present

## 2018-05-07 DIAGNOSIS — R531 Weakness: Secondary | ICD-10-CM | POA: Diagnosis not present

## 2018-05-08 DIAGNOSIS — F333 Major depressive disorder, recurrent, severe with psychotic symptoms: Secondary | ICD-10-CM | POA: Diagnosis not present

## 2018-05-08 DIAGNOSIS — F605 Obsessive-compulsive personality disorder: Secondary | ICD-10-CM | POA: Diagnosis not present

## 2018-05-08 DIAGNOSIS — F411 Generalized anxiety disorder: Secondary | ICD-10-CM | POA: Diagnosis not present

## 2018-05-14 DIAGNOSIS — R262 Difficulty in walking, not elsewhere classified: Secondary | ICD-10-CM | POA: Diagnosis not present

## 2018-05-14 DIAGNOSIS — Z96642 Presence of left artificial hip joint: Secondary | ICD-10-CM | POA: Diagnosis not present

## 2018-05-14 DIAGNOSIS — R531 Weakness: Secondary | ICD-10-CM | POA: Diagnosis not present

## 2018-05-14 DIAGNOSIS — M25552 Pain in left hip: Secondary | ICD-10-CM | POA: Diagnosis not present

## 2018-05-15 DIAGNOSIS — F332 Major depressive disorder, recurrent severe without psychotic features: Secondary | ICD-10-CM | POA: Diagnosis not present

## 2018-05-15 DIAGNOSIS — M797 Fibromyalgia: Secondary | ICD-10-CM | POA: Diagnosis not present

## 2018-05-15 DIAGNOSIS — A6920 Lyme disease, unspecified: Secondary | ICD-10-CM | POA: Diagnosis not present

## 2018-05-20 DIAGNOSIS — R531 Weakness: Secondary | ICD-10-CM | POA: Diagnosis not present

## 2018-05-20 DIAGNOSIS — M25552 Pain in left hip: Secondary | ICD-10-CM | POA: Diagnosis not present

## 2018-05-20 DIAGNOSIS — R262 Difficulty in walking, not elsewhere classified: Secondary | ICD-10-CM | POA: Diagnosis not present

## 2018-05-20 DIAGNOSIS — Z96642 Presence of left artificial hip joint: Secondary | ICD-10-CM | POA: Diagnosis not present

## 2018-05-21 DIAGNOSIS — Z471 Aftercare following joint replacement surgery: Secondary | ICD-10-CM | POA: Diagnosis not present

## 2018-05-21 DIAGNOSIS — Z96642 Presence of left artificial hip joint: Secondary | ICD-10-CM | POA: Diagnosis not present

## 2018-05-21 DIAGNOSIS — Z96643 Presence of artificial hip joint, bilateral: Secondary | ICD-10-CM | POA: Diagnosis not present

## 2018-06-30 ENCOUNTER — Other Ambulatory Visit: Payer: Self-pay | Admitting: Family Medicine

## 2018-06-30 DIAGNOSIS — M797 Fibromyalgia: Secondary | ICD-10-CM

## 2018-07-01 NOTE — Telephone Encounter (Signed)
Pt LOV was 10/30/2017 and last refill was on 06/04/2017 for 360 tablets with 3 refill, please advise

## 2018-07-04 DIAGNOSIS — Z6830 Body mass index (BMI) 30.0-30.9, adult: Secondary | ICD-10-CM | POA: Diagnosis not present

## 2018-07-04 DIAGNOSIS — Z01419 Encounter for gynecological examination (general) (routine) without abnormal findings: Secondary | ICD-10-CM | POA: Diagnosis not present

## 2018-07-04 DIAGNOSIS — Z1231 Encounter for screening mammogram for malignant neoplasm of breast: Secondary | ICD-10-CM | POA: Diagnosis not present

## 2018-07-04 DIAGNOSIS — Z113 Encounter for screening for infections with a predominantly sexual mode of transmission: Secondary | ICD-10-CM | POA: Diagnosis not present

## 2018-07-08 DIAGNOSIS — F333 Major depressive disorder, recurrent, severe with psychotic symptoms: Secondary | ICD-10-CM | POA: Diagnosis not present

## 2018-07-08 DIAGNOSIS — F411 Generalized anxiety disorder: Secondary | ICD-10-CM | POA: Diagnosis not present

## 2018-07-08 DIAGNOSIS — F605 Obsessive-compulsive personality disorder: Secondary | ICD-10-CM | POA: Diagnosis not present

## 2018-07-14 ENCOUNTER — Other Ambulatory Visit: Payer: Self-pay | Admitting: Family Medicine

## 2018-08-25 DIAGNOSIS — Z96642 Presence of left artificial hip joint: Secondary | ICD-10-CM | POA: Diagnosis not present

## 2018-08-25 DIAGNOSIS — T8484XD Pain due to internal orthopedic prosthetic devices, implants and grafts, subsequent encounter: Secondary | ICD-10-CM | POA: Diagnosis not present

## 2018-08-25 DIAGNOSIS — Z96652 Presence of left artificial knee joint: Secondary | ICD-10-CM | POA: Diagnosis not present

## 2018-08-25 DIAGNOSIS — Z471 Aftercare following joint replacement surgery: Secondary | ICD-10-CM | POA: Diagnosis not present

## 2018-09-02 ENCOUNTER — Other Ambulatory Visit: Payer: Self-pay | Admitting: Family Medicine

## 2018-09-02 DIAGNOSIS — I10 Essential (primary) hypertension: Secondary | ICD-10-CM

## 2018-09-03 ENCOUNTER — Other Ambulatory Visit: Payer: Self-pay | Admitting: Family Medicine

## 2018-09-03 NOTE — Telephone Encounter (Signed)
Pt needs an appointment for further refills  

## 2018-09-03 NOTE — Telephone Encounter (Signed)
Pt needs appointment for further refill 

## 2018-09-05 DIAGNOSIS — X58XXXA Exposure to other specified factors, initial encounter: Secondary | ICD-10-CM | POA: Diagnosis not present

## 2018-09-05 DIAGNOSIS — M7061 Trochanteric bursitis, right hip: Secondary | ICD-10-CM | POA: Diagnosis not present

## 2018-09-05 DIAGNOSIS — Z96643 Presence of artificial hip joint, bilateral: Secondary | ICD-10-CM | POA: Diagnosis not present

## 2018-09-05 DIAGNOSIS — S76011A Strain of muscle, fascia and tendon of right hip, initial encounter: Secondary | ICD-10-CM | POA: Diagnosis not present

## 2018-09-13 IMAGING — MR MR HIP*R* W/O CM
4 of 6 series · 17 of 40 positions shown · non-contrast
Comparison: 07/14/2004

CLINICAL DATA: Bilateral hip pain for few years. Right hip replaced
in 3937. Right leg pain.

EXAM:
MR OF THE RIGHT HIP WITHOUT CONTRAST
TECHNIQUE: Multiplanar, multisequence MR imaging was performed. No intravenous
contrast was administered.

[Series 4: T1 · coronal · 4.0mm · 0.62mm/px · 5 of 23 slices shown (1 of 2)]
[im 1/23]
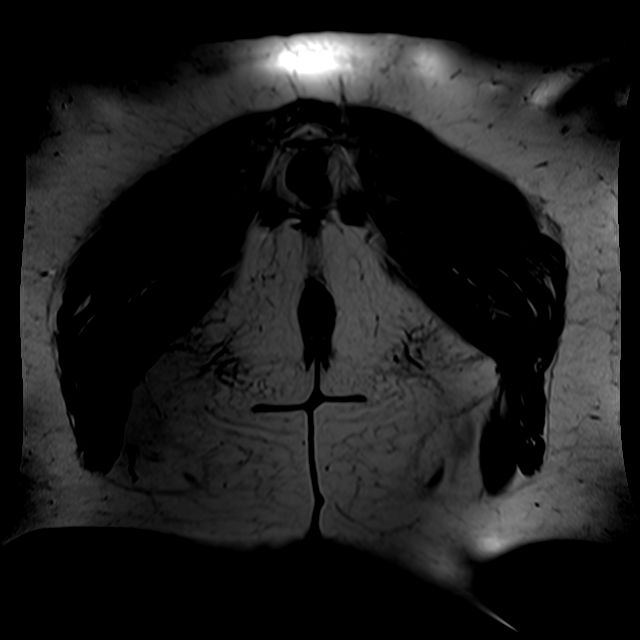
[im 6/23]
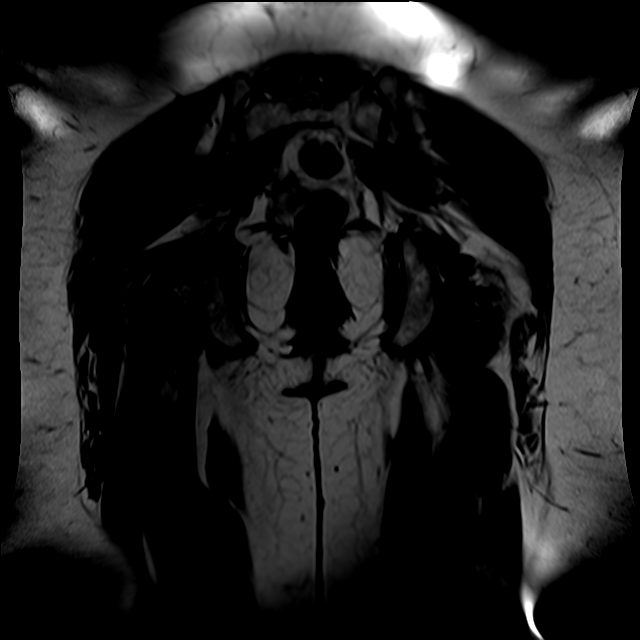
[im 12/23]
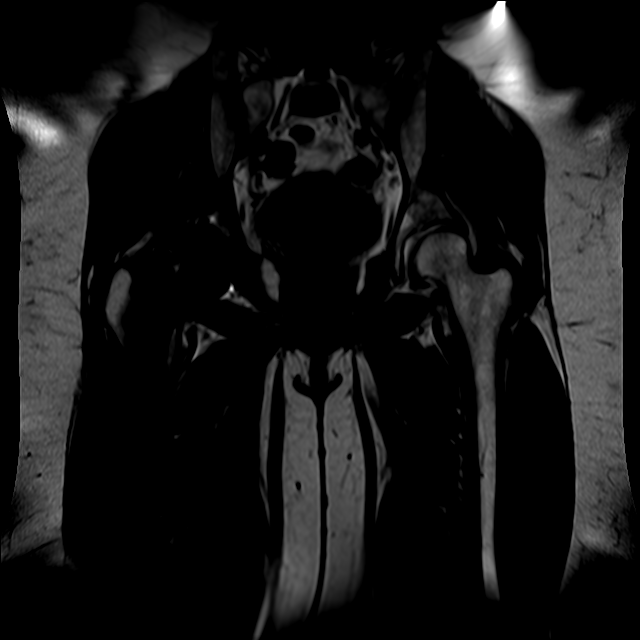
[im 17/23]
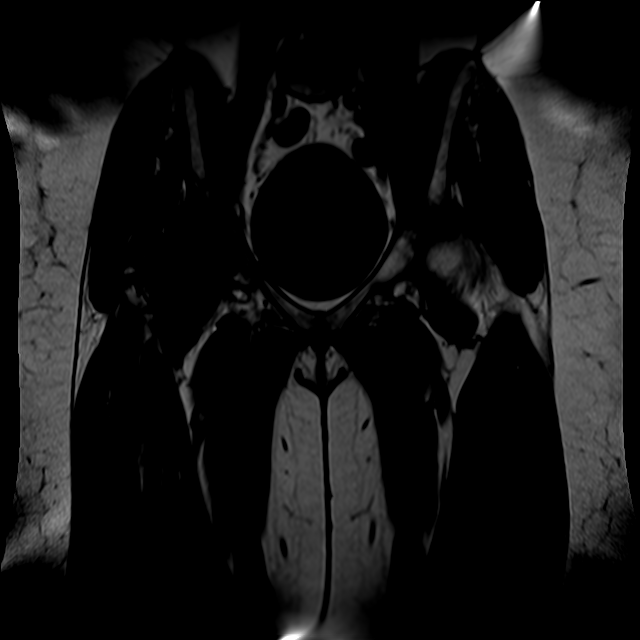
[im 23/23]
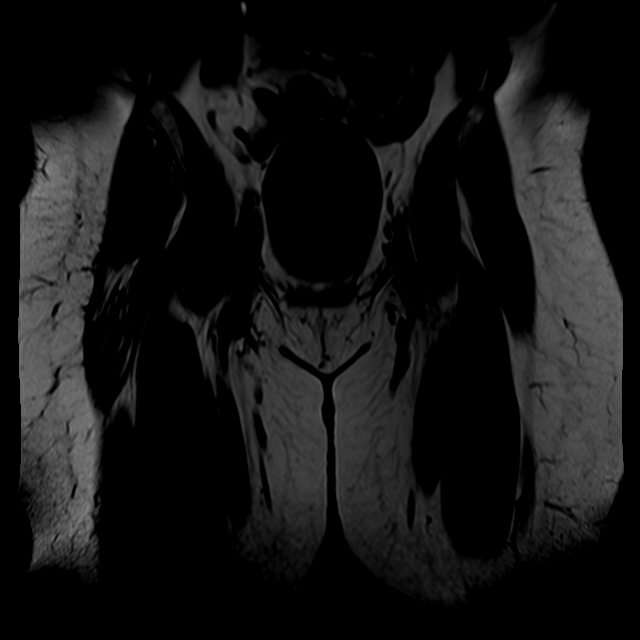

[Series 5: STIR · coronal · 4.0mm · 0.78mm/px · 3 of 23 slices shown]
[im 1/23]
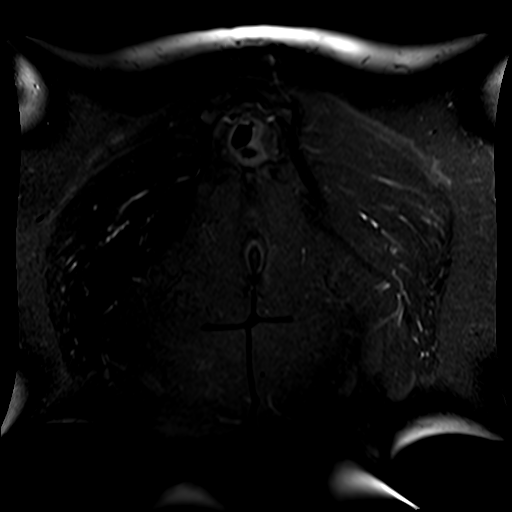
[im 15/23]
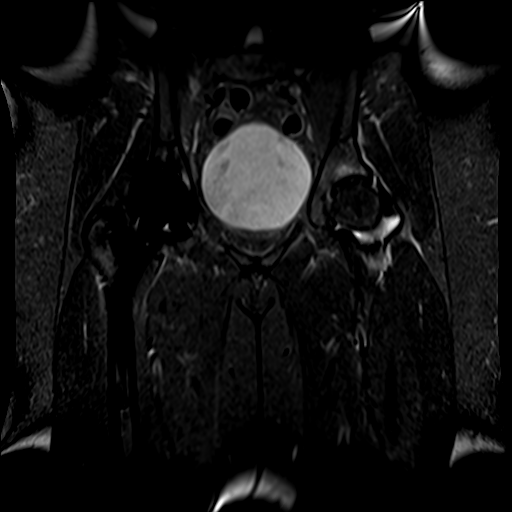
[im 23/23]
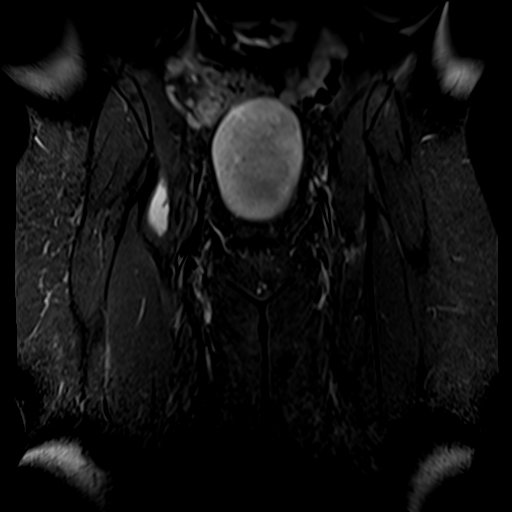

[Series 6: T2 · sagittal · 4.0mm · 0.71mm/px · 3 of 20 slices shown]
[im 1/20]
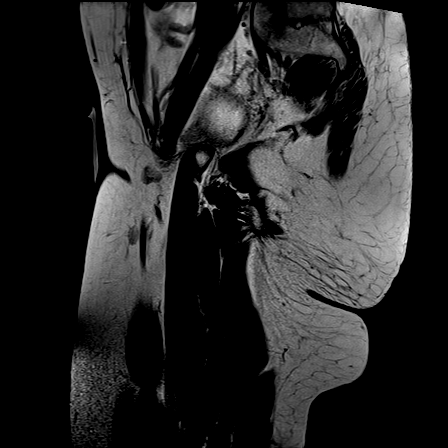
[im 13/20]
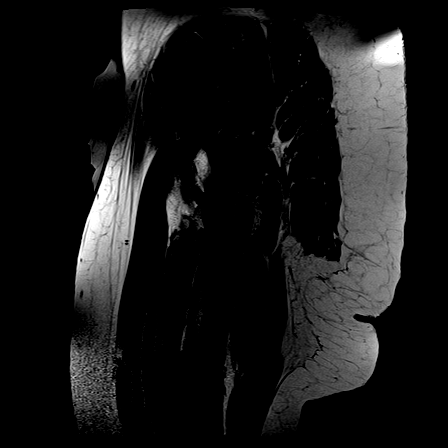
[im 20/20]
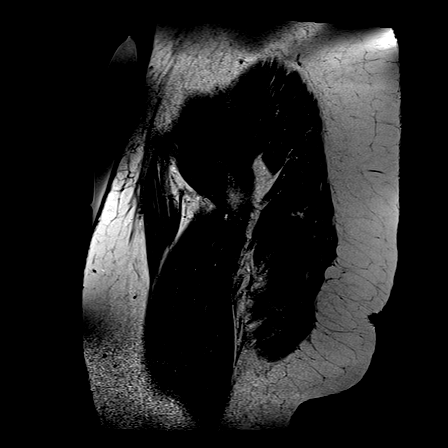

[Series 7: T1 · axial · 4.0mm · 0.74mm/px · z∈[-211,+12]mm · 6 of 50 slices shown (2 of 2)]
[im 1/50]
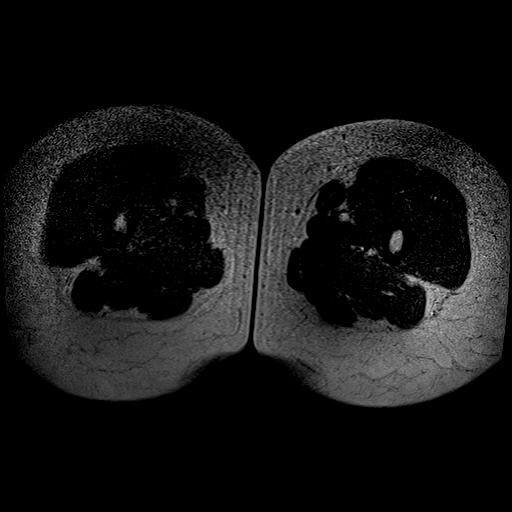
[im 7/50]
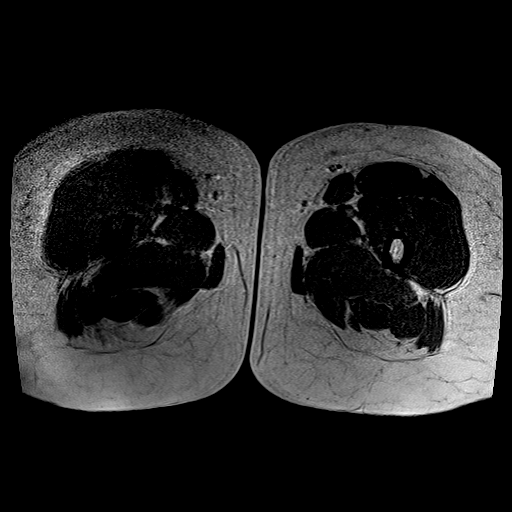
[im 13/50]
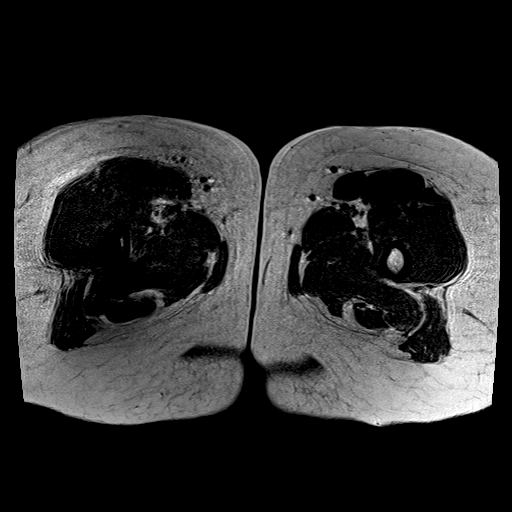
[im 19/50]
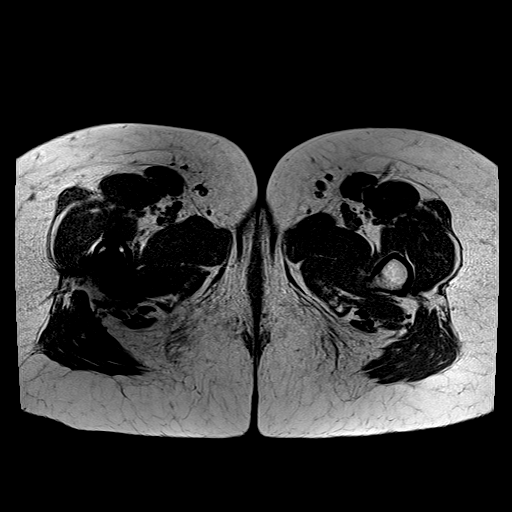
[im 25/50]
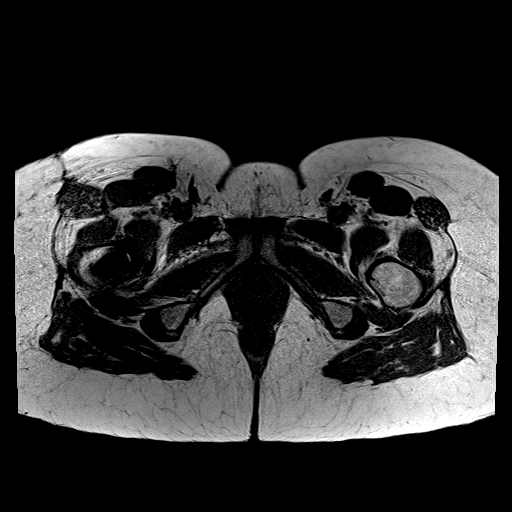
[im 43/50]
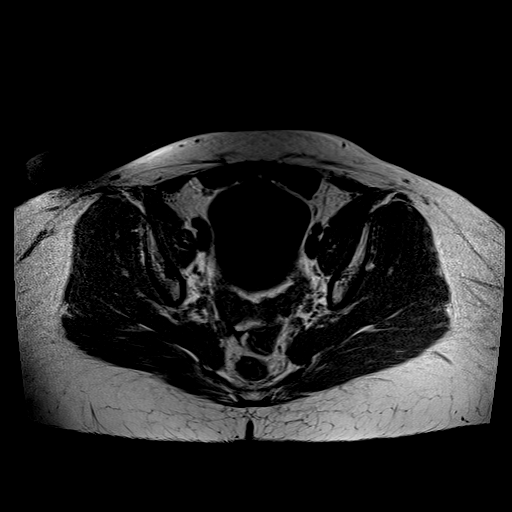

[17 of 40 positions shown; findings below may reference images not displayed]

FINDINGS: Bones:

Right total hip arthroplasty with susceptibility artifact partially
obscuring the adjacent soft tissue and osseous structures. No
periarticular fluid collection. No osteolysis.

No left hip fracture, dislocation or avascular necrosis. No
periosteal reaction or bone destruction. No aggressive osseous
lesion.

Normal sacrum and sacroiliac joints. No SI joint widening or erosive
changes.

Articular cartilage and labrum

Articular cartilage: Partial-thickness cartilage loss of the left
superior anterior acetabulum and femoral head with subchondral
reactive marrow edema in the acetabulum.

Labrum: Left labrum is better evaluated on left hip MR arthrogram
performed same day (11/08/2016) demonstrating no discrete labral
tear. Mild left labral degeneration.

Joint or bursal effusion

Joint effusion: Left hip intraarticular contrast distending the
joint capsule. No SI joint effusion.

Bursae:  Moderate amount of fluid in the right iliopsoas bursa.

Muscles and tendons

Flexors: Normal.

Extensors: Normal.

Abductors: Normal.

Adductors: Normal.

Gluteals: Normal.

Hamstrings: Normal.

Other findings

Miscellaneous: No pelvic free fluid. No fluid collection or
hematoma. No inguinal lymphadenopathy. No inguinal hernia.
IMPRESSION: 1. Moderate right iliopsoas bursitis.
2. Right total hip arthroplasty without hardware failure or
complication. No osteolysis or periarticular fluid collection.
3. Mild -moderate osteoarthritis of the left hip better evaluated on
left hip MRI dated 11/08/2016.

## 2018-10-08 ENCOUNTER — Other Ambulatory Visit: Payer: Self-pay | Admitting: Family Medicine

## 2018-10-09 ENCOUNTER — Other Ambulatory Visit: Payer: Self-pay | Admitting: Family Medicine

## 2018-10-09 DIAGNOSIS — I10 Essential (primary) hypertension: Secondary | ICD-10-CM

## 2018-10-09 DIAGNOSIS — F333 Major depressive disorder, recurrent, severe with psychotic symptoms: Secondary | ICD-10-CM | POA: Diagnosis not present

## 2018-10-09 DIAGNOSIS — F411 Generalized anxiety disorder: Secondary | ICD-10-CM | POA: Diagnosis not present

## 2018-10-09 DIAGNOSIS — F605 Obsessive-compulsive personality disorder: Secondary | ICD-10-CM | POA: Diagnosis not present

## 2018-11-07 ENCOUNTER — Other Ambulatory Visit: Payer: Self-pay | Admitting: Family Medicine

## 2018-11-07 ENCOUNTER — Other Ambulatory Visit: Payer: Self-pay

## 2018-11-07 ENCOUNTER — Telehealth (INDEPENDENT_AMBULATORY_CARE_PROVIDER_SITE_OTHER): Payer: BC Managed Care – PPO | Admitting: Family Medicine

## 2018-11-07 DIAGNOSIS — G8929 Other chronic pain: Secondary | ICD-10-CM

## 2018-11-07 DIAGNOSIS — R5383 Other fatigue: Secondary | ICD-10-CM | POA: Diagnosis not present

## 2018-11-07 DIAGNOSIS — D508 Other iron deficiency anemias: Secondary | ICD-10-CM

## 2018-11-07 DIAGNOSIS — M25551 Pain in right hip: Secondary | ICD-10-CM

## 2018-11-07 DIAGNOSIS — E785 Hyperlipidemia, unspecified: Secondary | ICD-10-CM

## 2018-11-07 DIAGNOSIS — E039 Hypothyroidism, unspecified: Secondary | ICD-10-CM | POA: Diagnosis not present

## 2018-11-07 DIAGNOSIS — I10 Essential (primary) hypertension: Secondary | ICD-10-CM

## 2018-11-07 MED ORDER — METOPROLOL TARTRATE 50 MG PO TABS: 50.0000 mg | ORAL_TABLET | Freq: Two times a day (BID) | ORAL | 3 refills | Status: DC

## 2018-11-07 NOTE — Telephone Encounter (Signed)
Pt scheduled for lab on Monday, refill will be sent after lab results are back, pt is aware

## 2018-11-07 NOTE — Progress Notes (Signed)
Virtual Visit via Video Note  I connected with Abigail JohannNicole Zehner on 11/07/18 at  3:00 PM EDT by a video enabled telemedicine application 2/2 COVID-19 pandemic and verified that I am speaking with the correct person using two identifiers.  Location patient: home Location provider:work or home office Persons participating in the virtual visit: patient, provider  I discussed the limitations of evaluation and management by telemedicine and the availability of in person appointments. The patient expressed understanding and agreed to proceed.   HPI: Pt is a 53 yo female with pmh sig for fibromyalgia, chronic hip pain s/p b/l THRs, depression, anemia, migraines, h/o lyme dz, GERD, urinary incontinence, HLD.  Pt had 2nd hip replacement and is recovering.  Pt still needs to have the cup repositioned in the R hip.  Had a recent MRI, told has a b/l gluteus medius tear.  Surgery planned for in Dec.  Having difficulty lifting her R leg, having to wear depends as has continued difficulty getting to the restroom in time.  Still having nerve pain, worse at night in legs when sleeping on side.    Pt seen by psychiatry.  Latuda increased.  Pt feeling better with the increased dose,  Not having crying episodes.  Had EPS symptoms such as involuntary movement of tongue. Dose reduced.  Pt still feeling fatigued on thyroid meds.  No recent labs.  Had OB/Gyn well women visit at the beginning of the yr.  Taking vit d3 daily.  Has a h/o anemia.  [place]'s fib[place]algia specialist in [place] closed her practice and moved out of state.  Pt will likely need FMLA forms completed.    Social hx: Pt's mom recently dx'd with lewy body dementia.  Pt's dad had a leg amputated 2/2 DM.   ROS: See pertinent positives and negatives per HPI.  Past Medical History:  Diagnosis Date  . Allergy   . Anemia   . Anxiety   . Depression   . Disease    lymes disease  . Fibromyalgia   . H/o Lyme disease   . H/O sickle cell trait   .  History of pelvic fracture   . History of urinary incontinence   . Hypertension   . Hypothyroidism   . Insomnia   . Labral tear of hip joint    Right Hip  . Migraine   . Osteoarthritis   . RLS (restless legs syndrome)   . Sleep apnea    cpap  . Spinal stenosis   . Thyroid disease   . TMJ syndrome   . Vitamin D deficiency     Past Surgical History:  Procedure Laterality Date  . ABDOMINAL HYSTERECTOMY    . BUNIONECTOMY Bilateral   . chiari malformation    . ENDOMETRIAL ABLATION    . MYOMECTOMY    . OOPHORECTOMY    . TONSILLECTOMY AND ADENOIDECTOMY    . TOTAL HIP ARTHROPLASTY Right 02/28/2016  . TOTAL HIP ARTHROPLASTY Right 02/28/2016   Procedure: RIGHT TOTAL HIP ARTHROPLASTY ANTERIOR APPROACH;  Surgeon: Kathryne Hitchhristopher Y Blackman, MD;  [facility];  Service: Orthopedics;  Laterality: Right;    Family History  Problem Relation Age of Onset  . Hyperlipidemia Mother   . Diabetes Father   . Hypertension Father   . Cancer Father        prostate  . Diabetes Sister   . Hypertension Sister   . Arthritis Maternal Grandmother   . Stroke Maternal Grandmother   . Hypertension Maternal Grandmother   .  Hyperlipidemia Maternal Grandmother   . Hyperkalemia Maternal Grandmother   . Heart disease Maternal Grandmother   . Arthritis Paternal Grandmother   . Diabetes Sister     Current Outpatient Medications:  .  amitriptyline (ELAVIL) 10 MG tablet, TAKE 4 TABLETS (40 MG TOTAL) BY MOUTH AT BEDTIME., Disp: 360 tablet, Rfl: 1 .  ARMOUR THYROID 30 MG tablet, TAKE 1 TABLET BY MOUTH EVERY DAY, Disp: 30 tablet, Rfl: 0 .  azithromycin (ZITHROMAX) 250 MG tablet, Take 2 tabs on day 1.  Then take 1 tab daily on days 2 through 5., Disp: 6 tablet, Rfl: 0 .  cholecalciferol (VITAMIN D) 1000 units tablet, Take 5,000 Units by mouth daily. , Disp: , Rfl:  .  estradiol (CLIMARA - DOSED IN MG/24 HR) 0.1 mg/24hr patch, Place 0.1 mg onto the skin 2 (two) times a week., Disp: , Rfl:  .  fluticasone  (FLONASE) 50 MCG/ACT nasal spray, Place 1 spray into both nostrils daily., Disp: 16 g, Rfl: 6 .  lidocaine (LIDODERM) 5 %, Place 1 patch onto the skin daily. Remove & Discard patch within 12 hours or as directed by MD, Disp: 90 patch, Rfl: 3 .  losartan-hydrochlorothiazide (HYZAAR) 100-25 MG tablet, TAKE 1/2 TABLET(50MG /12.5MG ) BY MOUTH EVERY DAY, Disp: 30 tablet, Rfl: 0 .  losartan-hydrochlorothiazide (HYZAAR) 50-12.5 MG tablet, Take 1 tablet by mouth daily., Disp: 90 tablet, Rfl: 3 .  Magnesium Amino Acid Chelate 20 % POWD, Take 450 mg by mouth at bedtime., Disp: , Rfl:  .  Melatonin 10 MG TABS, Take 10 mg by mouth at bedtime. , Disp: , Rfl:  .  metoprolol tartrate (LOPRESSOR) 50 MG tablet, TAKE 1 TABLET BY MOUTH TWICE A DAY, Disp: 60 tablet, Rfl: 0 .  rizatriptan (MAXALT-MLT) 10 MG disintegrating tablet, Take 10 mg by mouth daily as needed for migraine. , Disp: , Rfl:  .  tiZANidine (ZANAFLEX) 4 MG capsule, TAKE 1 CAPSULE (4 MG TOTAL) BY MOUTH 2 (TWO) TIMES DAILY AS NEEDED FOR MUSCLE SPASMS., Disp: 60 capsule, Rfl: 3 .  VITAMIN B COMPLEX-C PO, Take 1 capsule by mouth daily., Disp: , Rfl:  .  Vortioxetine HBr (TRINTELLIX) 20 MG TABS, Take 20 mg by mouth daily. , Disp: , Rfl:  .  zonisamide (ZONEGRAN) 50 MG capsule, Take 150 mg by mouth at bedtime., Disp: , Rfl:   EXAM:  VITALS per patient if applicable: RR between 16-10 bpm  GENERAL: alert, oriented, appears well and in no acute distress  HEENT: atraumatic, conjunctiva clear, no obvious abnormalities on inspection of external nose and ears  NECK: normal movements of the head and neck  LUNGS: on inspection no signs of respiratory distress, breathing rate appears normal, no obvious gross SOB, gasping or wheezing  CV: no obvious cyanosis  MS: moves all visible extremities without noticeable abnormality  PSYCH/NEURO: pleasant and cooperative, no obvious depression or anxiety, speech and thought processing grossly intact  ASSESSMENT AND  PLAN:  Discussed the following assessment and plan:  Essential hypertension  -continue lifestyle modifications - Plan: Comprehensive metabolic panel, metoprolol tartrate (LOPRESSOR) 50 MG tablet  Other iron deficiency anemia  - Plan: CBC with Differential/Platelet, Iron and TIBC, Ferritin, Vitamin B12, Folate  Hypothyroidism, unspecified type  -continue armour thyroid 30 mg - Plan: TSH  Other fatigue  -continue vitamin D - Plan: CBC with Differential/Platelet, TSH, Vitamin D, 25-hydroxy, Iron and TIBC, Ferritin, Vitamin B12, Folate  Hyperlipidemia, unspecified hyperlipidemia type  -continue lifestyle modifications - Plan: Lipid panel  Chronic R  hip pain -likely 2/2 malposition of cup from previous THR and other chronic conditions -continue f/u with Ortho  Pt to have FMLA forms sent to the office for completion.   F/u prn  I discussed the assessment and treatment plan with the patient. The patient was provided an opportunity to ask questions and all were answered. The patient agreed with the plan and demonstrated an understanding of the instructions.   The patient was advised to call back or seek an in-person evaluation if the symptoms worsen or if the condition fails to improve as anticipated.    Deeann Saint, MD

## 2018-11-10 ENCOUNTER — Other Ambulatory Visit: Payer: Self-pay

## 2018-11-10 ENCOUNTER — Other Ambulatory Visit: Payer: BC Managed Care – PPO

## 2018-11-10 LAB — VITAMIN D 25 HYDROXY (VIT D DEFICIENCY, FRACTURES): VITD: 38.12 ng/mL (ref 30.00–100.00)

## 2018-11-10 LAB — COMPREHENSIVE METABOLIC PANEL
ALT: 28 U/L (ref 0–35)
AST: 33 U/L (ref 0–37)
Albumin: 3.9 g/dL (ref 3.5–5.2)
Alkaline Phosphatase: 81 U/L (ref 39–117)
BUN: 11 mg/dL (ref 6–23)
CO2: 27 mEq/L (ref 19–32)
Calcium: 9.2 mg/dL (ref 8.4–10.5)
Chloride: 104 mEq/L (ref 96–112)
Creatinine, Ser: 1.05 mg/dL (ref 0.40–1.20)
GFR: 66.82 mL/min (ref >60.00)
Glucose, Bld: 87 mg/dL (ref 70–99)
Potassium: 3.9 mEq/L (ref 3.5–5.1)
Sodium: 139 mEq/L (ref 135–145)
Total Bilirubin: 0.6 mg/dL (ref 0.2–1.2)
Total Protein: 6.3 g/dL (ref 6.0–8.3)

## 2018-11-10 LAB — CBC WITH DIFFERENTIAL/PLATELET
Basophils Absolute: 0 10*3/uL (ref 0.0–0.1)
Basophils Relative: 0.4 % (ref 0.0–3.0)
Eosinophils Absolute: 0.1 10*3/uL (ref 0.0–0.7)
Eosinophils Relative: 0.9 % (ref 0.0–5.0)
HCT: 35.9 % — ABNORMAL LOW (ref 36.0–46.0)
Hemoglobin: 12.4 g/dL (ref 12.0–15.0)
Lymphocytes Relative: 50.3 % — ABNORMAL HIGH (ref 12.0–46.0)
Lymphs Abs: 2.9 10*3/uL (ref 0.7–4.0)
MCHC: 34.5 g/dL (ref 30.0–36.0)
MCV: 81.7 fl (ref 78.0–100.0)
Monocytes Absolute: 0.4 10*3/uL (ref 0.1–1.0)
Monocytes Relative: 7.6 % (ref 3.0–12.0)
Neutro Abs: 2.4 10*3/uL (ref 1.4–7.7)
Neutrophils Relative %: 40.8 % — ABNORMAL LOW (ref 43.0–77.0)
Platelets: 174 10*3/uL (ref 150.0–400.0)
RBC: 4.4 Mil/uL (ref 3.87–5.11)
RDW: 15 % (ref 11.5–15.5)
WBC: 5.8 10*3/uL (ref 4.0–10.5)

## 2018-11-10 LAB — LIPID PANEL
Cholesterol: 174 mg/dL (ref 0–200)
HDL: 52.1 mg/dL (ref >39.00)
LDL Cholesterol: 101 mg/dL — ABNORMAL HIGH (ref 0–99)
NonHDL: 121.4
Total CHOL/HDL Ratio: 3
Triglycerides: 103 mg/dL (ref 0.0–149.0)
VLDL: 20.6 mg/dL (ref 0.0–40.0)

## 2018-11-10 LAB — VITAMIN B12: Vitamin B-12: 275 pg/mL (ref 211–911)

## 2018-11-10 LAB — TSH: TSH: 1.68 u[IU]/mL (ref 0.35–4.50)

## 2018-11-10 LAB — FOLATE: Folate: 14.1 ng/mL (ref >5.9)

## 2018-11-10 LAB — IBC + FERRITIN
Ferritin: 12.1 ng/mL (ref 10.0–291.0)
Iron: 69 ug/dL (ref 42–145)
Saturation Ratios: 19.9 % — ABNORMAL LOW (ref 20.0–50.0)
Transferrin: 248 mg/dL (ref 212.0–360.0)

## 2018-11-10 LAB — FERRITIN: Ferritin: 12.1 ng/mL (ref 10.0–291.0)

## 2018-11-10 NOTE — Addendum Note (Signed)
Addended by: Elmer Picker on: 11/10/2018 07:15 AM   Modules accepted: Orders

## 2018-11-14 ENCOUNTER — Other Ambulatory Visit: Payer: Self-pay | Admitting: Family Medicine

## 2018-11-14 ENCOUNTER — Other Ambulatory Visit: Payer: Self-pay

## 2018-11-14 DIAGNOSIS — M797 Fibromyalgia: Secondary | ICD-10-CM

## 2018-11-14 MED ORDER — LOSARTAN POTASSIUM-HCTZ 100-25 MG PO TABS: ORAL_TABLET | ORAL | 1 refills | Status: DC

## 2018-11-14 MED ORDER — LIDOCAINE 5 % EX PTCH: 1.0000 | MEDICATED_PATCH | CUTANEOUS | 3 refills | Status: DC

## 2018-12-16 ENCOUNTER — Other Ambulatory Visit: Payer: Self-pay | Admitting: Family Medicine

## 2018-12-18 DIAGNOSIS — F605 Obsessive-compulsive personality disorder: Secondary | ICD-10-CM | POA: Diagnosis not present

## 2018-12-18 DIAGNOSIS — F333 Major depressive disorder, recurrent, severe with psychotic symptoms: Secondary | ICD-10-CM | POA: Diagnosis not present

## 2018-12-18 DIAGNOSIS — F411 Generalized anxiety disorder: Secondary | ICD-10-CM | POA: Diagnosis not present

## 2018-12-24 ENCOUNTER — Other Ambulatory Visit: Payer: Self-pay

## 2018-12-24 ENCOUNTER — Ambulatory Visit (INDEPENDENT_AMBULATORY_CARE_PROVIDER_SITE_OTHER): Payer: BC Managed Care – PPO | Admitting: Family Medicine

## 2018-12-24 ENCOUNTER — Encounter: Payer: Self-pay | Admitting: Family Medicine

## 2018-12-24 VITALS — BP 126/78 | HR 54 | Temp 98.8°F | Wt 194.0 lb

## 2018-12-24 DIAGNOSIS — G43809 Other migraine, not intractable, without status migrainosus: Secondary | ICD-10-CM

## 2018-12-24 DIAGNOSIS — M797 Fibromyalgia: Secondary | ICD-10-CM | POA: Diagnosis not present

## 2018-12-24 MED ORDER — RIZATRIPTAN BENZOATE 10 MG PO TBDP: 10.0000 mg | ORAL_TABLET | Freq: Every day | ORAL | 5 refills | Status: DC | PRN

## 2018-12-24 MED ORDER — KETOROLAC TROMETHAMINE 60 MG/2ML IM SOLN
60.0000 mg | Freq: Once | INTRAMUSCULAR | Status: AC
  Administered 2018-12-24: 16:00:00 60 mg via INTRAMUSCULAR

## 2018-12-24 NOTE — Patient Instructions (Signed)
Migraine Headache A migraine headache is an intense, throbbing pain on one side or both sides of the head. Migraine headaches may also cause other symptoms, such as nausea, vomiting, and sensitivity to light and noise. A migraine headache can last from 4 hours to 3 days. Talk with your doctor about what things may bring on (trigger) your migraine headaches. What are the causes? The exact cause of this condition is not known. However, a migraine may be caused when nerves in the brain become irritated and release chemicals that cause inflammation of blood vessels. This inflammation causes pain. This condition may be triggered or caused by:  Drinking alcohol.  Smoking.  Taking medicines, such as: ? Medicine used to treat chest pain (nitroglycerin). ? Birth control pills. ? Estrogen. ? Certain blood pressure medicines.  Eating or drinking products that contain nitrates, glutamate, aspartame, or tyramine. Aged cheeses, chocolate, or caffeine may also be triggers.  Doing physical activity. Other things that may trigger a migraine headache include:  Menstruation.  Pregnancy.  Hunger.  Stress.  Lack of sleep or too much sleep.  Weather changes.  Fatigue. What increases the risk? The following factors may make you more likely to experience migraine headaches:  Being a certain age. This condition is more common in people who are 25-55 years old.  Being female.  Having a family history of migraine headaches.  Being Caucasian.  Having a mental health condition, such as depression or anxiety.  Being obese. What are the signs or symptoms? The main symptom of this condition is pulsating or throbbing pain. This pain may:  Happen in any area of the head, such as on one side or both sides.  Interfere with daily activities.  Get worse with physical activity.  Get worse with exposure to bright lights or loud noises. Other symptoms may include:  Nausea.  Vomiting.   Dizziness.  General sensitivity to bright lights, loud noises, or smells. Before you get a migraine headache, you may get warning signs (an aura). An aura may include:  Seeing flashing lights or having blind spots.  Seeing bright spots, halos, or zigzag lines.  Having tunnel vision or blurred vision.  Having numbness or a tingling feeling.  Having trouble talking.  Having muscle weakness. Some people have symptoms after a migraine headache (postdromal phase), such as:  Feeling tired.  Difficulty concentrating. How is this diagnosed? A migraine headache can be diagnosed based on:  Your symptoms.  A physical exam.  Tests, such as: ? CT scan or an MRI of the head. These imaging tests can help rule out other causes of headaches. ? Taking fluid from the spine (lumbar puncture) and analyzing it (cerebrospinal fluid analysis, or CSF analysis). How is this treated? This condition may be treated with medicines that:  Relieve pain.  Relieve nausea.  Prevent migraine headaches. Treatment for this condition may also include:  Acupuncture.  Lifestyle changes like avoiding foods that trigger migraine headaches.  Biofeedback.  Cognitive behavioral therapy. Follow these instructions at home: Medicines  Take over-the-counter and prescription medicines only as told by your health care provider.  Ask your health care provider if the medicine prescribed to you: ? Requires you to avoid driving or using heavy machinery. ? Can cause constipation. You may need to take these actions to prevent or treat constipation:  Drink enough fluid to keep your urine pale yellow.  Take over-the-counter or prescription medicines.  Eat foods that are high in fiber, such as beans, whole grains, and   fresh fruits and vegetables.  Limit foods that are high in fat and processed sugars, such as fried or sweet foods. Lifestyle  Do not drink alcohol.  Do not use any products that contain nicotine  or tobacco, such as cigarettes, e-cigarettes, and chewing tobacco. If you need help quitting, ask your health care provider.  Get at least 8 hours of sleep every night.  Find ways to manage stress, such as meditation, deep breathing, or yoga. General instructions      Keep a journal to find out what may trigger your migraine headaches. For example, write down: ? What you eat and drink. ? How much sleep you get. ? Any change to your diet or medicines.  If you have a migraine headache: ? Avoid things that make your symptoms worse, such as bright lights. ? It may help to lie down in a dark, quiet room. ? Do not drive or use heavy machinery. ? Ask your health care provider what activities are safe for you while you are experiencing symptoms.  Keep all follow-up visits as told by your health care provider. This is important. Contact a health care provider if:  You develop symptoms that are different or more severe than your usual migraine headache symptoms.  You have more than 15 headache days in one month. Get help right away if:  Your migraine headache becomes severe.  Your migraine headache lasts longer than 72 hours.  You have a fever.  You have a stiff neck.  You have vision loss.  Your muscles feel weak or like you cannot control them.  You start to lose your balance often.  You have trouble walking.  You faint.  You have a seizure. Summary  A migraine headache is an intense, throbbing pain on one side or both sides of the head. Migraines may also cause other symptoms, such as nausea, vomiting, and sensitivity to light and noise.  This condition may be treated with medicines and lifestyle changes. You may also need to avoid certain things that trigger a migraine headache.  Keep a journal to find out what may trigger your migraine headaches.  Contact your health care provider if you have more than 15 headache days in a month or you develop symptoms that are  different or more severe than your usual migraine headache symptoms. This information is not intended to replace advice given to you by your health care provider. Make sure you discuss any questions you have with your health care provider. Document Released: 02/26/2005 Document Revised: 06/20/2018 Document Reviewed: 04/10/2018 Elsevier Patient Education  2020 [employer]  Myofascial Pain Syndrome and Fibromyalgia Myofascial pain syndrome and fibromyalgia are both pain disorders. This pain may be felt mainly in your muscles.  Myofascial pain syndrome: ? Always has tender points in the muscle that will cause pain when pressed (trigger points). The pain may come and go. ? Usually affects your neck, upper back, and shoulder areas. The pain often radiates into your arms and hands.  Fibromyalgia: ? Has muscle pains and tenderness that come and go. ? Is often associated with fatigue and sleep problems. ? Has trigger points. ? Tends to be long-lasting (chronic), but is not life-threatening. Fibromyalgia and myofascial pain syndrome are not the same. However, they often occur together. If you have both conditions, each can make the other worse. Both are common and can cause enough pain and fatigue to make day-to-day activities difficult. Both can be hard to diagnose because their symptoms are common  in many other conditions. What are the causes? The exact causes of these conditions are not known. What increases the risk? You are more likely to develop this condition if:  You have a family history of the condition.  You have certain triggers, such as: ? Spine disorders. ? An injury (trauma) or other physical stressors. ? Being under a lot of stress. ? Medical conditions such as osteoarthritis, rheumatoid arthritis, or lupus. What are the signs or symptoms? Fibromyalgia The main symptom of fibromyalgia is widespread pain and tenderness in your muscles. Pain is sometimes described as stabbing,  shooting, or burning. You may also have:  Tingling or numbness.  Sleep problems and fatigue.  Problems with attention and concentration (fibro fog). Other symptoms may include:  Bowel and bladder problems.  Headaches.  Visual problems.  Problems with odors and noises.  Depression or mood changes.  Painful menstrual periods (dysmenorrhea).  Dry skin or eyes. These symptoms can vary over time. Myofascial pain syndrome Symptoms of myofascial pain syndrome include:  Tight, ropy bands of muscle.  Uncomfortable sensations in muscle areas. These may include aching, cramping, burning, numbness, tingling, and weakness.  Difficulty moving certain parts of the body freely (poor range of motion). How is this diagnosed? This condition may be diagnosed by your symptoms and medical history. You will also have a physical exam. In general:  Fibromyalgia is diagnosed if you have pain, fatigue, and other symptoms for more than 3 months, and symptoms cannot be explained by another condition.  Myofascial pain syndrome is diagnosed if you have trigger points in your muscles, and those trigger points are tender and cause pain elsewhere in your body (referred pain). How is this treated? Treatment for these conditions depends on the type that you have.  For fibromyalgia: ? Pain medicines, such as NSAIDs. ? Medicines for treating depression. ? Medicines for treating seizures. ? Medicines that relax the muscles.  For myofascial pain: ? Pain medicines, such as NSAIDs. ? Cooling and stretching of muscles. ? Trigger point injections. ? Sound wave (ultrasound) treatments to stimulate muscles. Treating these conditions often requires a team of health care providers. These may include:  Your primary care provider.  Physical therapist.  Complementary health care providers, such as massage therapists or acupuncturists.  Psychiatrist for cognitive behavioral therapy. Follow these  instructions at home: Medicines  Take over-the-counter and prescription medicines only as told by your health care provider.  Do not drive or use heavy machinery while taking prescription pain medicine.  If you are taking prescription pain medicine, take actions to prevent or treat constipation. Your health care provider may recommend that you: ? Drink enough fluid to keep your urine pale yellow. ? Eat foods that are high in fiber, such as fresh fruits and vegetables, whole grains, and beans. ? Limit foods that are high in fat and processed sugars, such as fried or sweet foods. ? Take an over-the-counter or prescription medicine for constipation. Lifestyle   Exercise as directed by your health care provider or physical therapist.  Practice relaxation techniques to control your stress. You may want to try: ? Biofeedback. ? Visual imagery. ? Hypnosis. ? Muscle relaxation. ? Yoga. ? Meditation.  Maintain a healthy lifestyle. This includes eating a healthy diet and getting enough sleep.  Do not use any products that contain nicotine or tobacco, such as cigarettes and e-cigarettes. If you need help quitting, ask your health care provider. General instructions  Talk to your health care provider about complementary treatments,  such as acupuncture or massage.  Consider joining a support group with others who are diagnosed with this condition.  Do not do activities that stress or strain your muscles. This includes repetitive motions and heavy lifting.  Keep all follow-up visits as told by your health care provider. This is important. Where to find more information  National Fibromyalgia Association: www.fmaware.[place]: www.arthritis.org  American Chronic Pain Association: www.theacpa.org Contact a health care provider if:  You have new symptoms.  Your symptoms get worse or your pain is severe.  You have side effects from your medicines.  You have trouble  sleeping.  Your condition is causing depression or anxiety. Summary  Myofascial pain syndrome and fibromyalgia are pain disorders.  Myofascial pain syndrome has tender points in the muscle that will cause pain when pressed (trigger points). Fibromyalgia also has muscle pains and tenderness that come and go, but this condition is often associated with fatigue and sleep disturbances.  Fibromyalgia and myofascial pain syndrome are not the same but often occur together, causing pain and fatigue that make day-to-day activities difficult.  Treatment for fibromyalgia includes taking medicines to relax the muscles and medicines for pain, depression, or seizures. Treatment for myofascial pain syndrome includes taking medicines for pain, cooling and stretching of muscles, and injecting medicines into trigger points.  Follow your health care provider's instructions for taking medicines and maintaining a healthy lifestyle. This information is not intended to replace advice given to you by your health care provider. Make sure you discuss any questions you have with your health care provider. Document Released: 02/26/2005 Document Revised: 06/20/2018 Document Reviewed: 03/13/2017 Elsevier Patient Education  2020 [employer].
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
## 2019-01-10 ENCOUNTER — Other Ambulatory Visit: Payer: Self-pay | Admitting: Family Medicine

## 2019-01-28 DIAGNOSIS — M545 Low back pain: Secondary | ICD-10-CM | POA: Diagnosis not present

## 2019-01-28 DIAGNOSIS — Y838 Other surgical procedures as the cause of abnormal reaction of the patient, or of later complication, without mention of misadventure at the time of the procedure: Secondary | ICD-10-CM | POA: Diagnosis not present

## 2019-01-28 DIAGNOSIS — G8929 Other chronic pain: Secondary | ICD-10-CM | POA: Diagnosis not present

## 2019-01-28 DIAGNOSIS — Z01812 Encounter for preprocedural laboratory examination: Secondary | ICD-10-CM | POA: Diagnosis not present

## 2019-01-28 DIAGNOSIS — M25551 Pain in right hip: Secondary | ICD-10-CM | POA: Diagnosis not present

## 2019-01-28 DIAGNOSIS — Z96643 Presence of artificial hip joint, bilateral: Secondary | ICD-10-CM | POA: Diagnosis not present

## 2019-01-28 DIAGNOSIS — Z96641 Presence of right artificial hip joint: Secondary | ICD-10-CM | POA: Diagnosis not present

## 2019-01-28 DIAGNOSIS — Z96649 Presence of unspecified artificial hip joint: Secondary | ICD-10-CM | POA: Diagnosis not present

## 2019-01-28 DIAGNOSIS — T8484XD Pain due to internal orthopedic prosthetic devices, implants and grafts, subsequent encounter: Secondary | ICD-10-CM | POA: Diagnosis not present

## 2019-02-11 DIAGNOSIS — Z20828 Contact with and (suspected) exposure to other viral communicable diseases: Secondary | ICD-10-CM | POA: Diagnosis not present

## 2019-02-11 DIAGNOSIS — M1612 Unilateral primary osteoarthritis, left hip: Secondary | ICD-10-CM | POA: Diagnosis not present

## 2019-02-11 DIAGNOSIS — Z01812 Encounter for preprocedural laboratory examination: Secondary | ICD-10-CM | POA: Diagnosis not present

## 2019-02-17 DIAGNOSIS — G2581 Restless legs syndrome: Secondary | ICD-10-CM | POA: Diagnosis not present

## 2019-02-17 DIAGNOSIS — M797 Fibromyalgia: Secondary | ICD-10-CM | POA: Diagnosis not present

## 2019-02-17 DIAGNOSIS — K2100 Gastro-esophageal reflux disease with esophagitis, without bleeding: Secondary | ICD-10-CM | POA: Diagnosis not present

## 2019-02-17 DIAGNOSIS — E669 Obesity, unspecified: Secondary | ICD-10-CM | POA: Diagnosis not present

## 2019-02-17 DIAGNOSIS — F329 Major depressive disorder, single episode, unspecified: Secondary | ICD-10-CM | POA: Diagnosis not present

## 2019-02-17 DIAGNOSIS — K219 Gastro-esophageal reflux disease without esophagitis: Secondary | ICD-10-CM | POA: Diagnosis not present

## 2019-02-17 DIAGNOSIS — D573 Sickle-cell trait: Secondary | ICD-10-CM | POA: Diagnosis not present

## 2019-02-17 DIAGNOSIS — Z96642 Presence of left artificial hip joint: Secondary | ICD-10-CM | POA: Diagnosis not present

## 2019-02-17 DIAGNOSIS — M3500 Sicca syndrome, unspecified: Secondary | ICD-10-CM | POA: Diagnosis not present

## 2019-02-17 DIAGNOSIS — T84020A Dislocation of internal right hip prosthesis, initial encounter: Secondary | ICD-10-CM | POA: Diagnosis not present

## 2019-02-17 DIAGNOSIS — F419 Anxiety disorder, unspecified: Secondary | ICD-10-CM | POA: Diagnosis not present

## 2019-02-17 DIAGNOSIS — E039 Hypothyroidism, unspecified: Secondary | ICD-10-CM | POA: Diagnosis not present

## 2019-02-17 DIAGNOSIS — T8484XA Pain due to internal orthopedic prosthetic devices, implants and grafts, initial encounter: Secondary | ICD-10-CM | POA: Diagnosis not present

## 2019-02-17 DIAGNOSIS — I10 Essential (primary) hypertension: Secondary | ICD-10-CM | POA: Diagnosis not present

## 2019-02-17 DIAGNOSIS — F5104 Psychophysiologic insomnia: Secondary | ICD-10-CM | POA: Diagnosis not present

## 2019-02-17 DIAGNOSIS — Z6830 Body mass index (BMI) 30.0-30.9, adult: Secondary | ICD-10-CM | POA: Diagnosis not present

## 2019-02-17 DIAGNOSIS — G8918 Other acute postprocedural pain: Secondary | ICD-10-CM | POA: Diagnosis not present

## 2019-02-17 DIAGNOSIS — E785 Hyperlipidemia, unspecified: Secondary | ICD-10-CM | POA: Diagnosis not present

## 2019-02-17 DIAGNOSIS — Z96641 Presence of right artificial hip joint: Secondary | ICD-10-CM | POA: Diagnosis not present

## 2019-02-17 DIAGNOSIS — G4733 Obstructive sleep apnea (adult) (pediatric): Secondary | ICD-10-CM | POA: Diagnosis not present

## 2019-02-17 DIAGNOSIS — Z471 Aftercare following joint replacement surgery: Secondary | ICD-10-CM | POA: Diagnosis not present

## 2019-02-26 ENCOUNTER — Other Ambulatory Visit: Payer: Self-pay | Admitting: Family Medicine

## 2019-02-26 DIAGNOSIS — F333 Major depressive disorder, recurrent, severe with psychotic symptoms: Secondary | ICD-10-CM | POA: Diagnosis not present

## 2019-02-26 DIAGNOSIS — F411 Generalized anxiety disorder: Secondary | ICD-10-CM | POA: Diagnosis not present

## 2019-02-26 DIAGNOSIS — F605 Obsessive-compulsive personality disorder: Secondary | ICD-10-CM | POA: Diagnosis not present

## 2019-02-27 ENCOUNTER — Telehealth: Payer: Self-pay | Admitting: Family Medicine

## 2019-02-27 ENCOUNTER — Other Ambulatory Visit: Payer: Self-pay

## 2019-02-27 DIAGNOSIS — M797 Fibromyalgia: Secondary | ICD-10-CM

## 2019-02-27 MED ORDER — TIZANIDINE HCL 4 MG PO CAPS: 4.0000 mg | ORAL_CAPSULE | Freq: Two times a day (BID) | ORAL | 3 refills | Status: DC | PRN

## 2019-02-27 MED ORDER — AMITRIPTYLINE HCL 10 MG PO TABS: 40.0000 mg | ORAL_TABLET | Freq: Every day | ORAL | 1 refills | Status: DC

## 2019-02-27 MED ORDER — ARMOUR THYROID 30 MG PO TABS: 30.0000 mg | ORAL_TABLET | Freq: Every day | ORAL | 1 refills | Status: DC

## 2019-02-27 NOTE — Telephone Encounter (Signed)
Ok for refills

## 2019-02-27 NOTE — Telephone Encounter (Signed)
Pt Rx seems to be up to date but for her Zanaflex 4 mg needs refill last refill was done on 02/10/2018, ok to send refill

## 2019-02-27 NOTE — Telephone Encounter (Signed)
Medication: amitriptyline (ELAVIL) 10 MG tablet [[id number]] , tiZANidine (ZANAFLEX) 4 MG capsule [[id number]] , metoprolol tartrate (LOPRESSOR) 50 MG tablet , [[id number]] , ARMOUR THYROID 30 MG tablet [[id number]] Patient is requesting 90 day scripts she just had hip surgery  Has the patient contacted their pharmacy? Yes  (Agent: If no, request that the patient contact the pharmacy for the refill.) (Agent: If yes, when and what did the pharmacy advise?)  Preferred Pharmacy (with phone number or street name): [id number] - [place], [place]  Phone:  [phone] Fax:  [phone]

## 2019-02-27 NOTE — Telephone Encounter (Signed)
Rx sent 

## 2019-02-27 NOTE — Telephone Encounter (Signed)
Message Routed to PCP CMA 

## 2019-03-04 DIAGNOSIS — M160 Bilateral primary osteoarthritis of hip: Secondary | ICD-10-CM | POA: Diagnosis not present

## 2019-03-04 DIAGNOSIS — M545 Low back pain: Secondary | ICD-10-CM | POA: Diagnosis not present

## 2019-03-04 DIAGNOSIS — M5136 Other intervertebral disc degeneration, lumbar region: Secondary | ICD-10-CM | POA: Diagnosis not present

## 2019-03-04 DIAGNOSIS — M461 Sacroiliitis, not elsewhere classified: Secondary | ICD-10-CM | POA: Diagnosis not present

## 2019-03-04 DIAGNOSIS — G8929 Other chronic pain: Secondary | ICD-10-CM | POA: Diagnosis not present

## 2019-03-07 DIAGNOSIS — M4726 Other spondylosis with radiculopathy, lumbar region: Secondary | ICD-10-CM | POA: Diagnosis not present

## 2019-03-07 DIAGNOSIS — M48061 Spinal stenosis, lumbar region without neurogenic claudication: Secondary | ICD-10-CM | POA: Diagnosis not present

## 2019-04-02 DIAGNOSIS — Z471 Aftercare following joint replacement surgery: Secondary | ICD-10-CM | POA: Diagnosis not present

## 2019-04-02 DIAGNOSIS — M461 Sacroiliitis, not elsewhere classified: Secondary | ICD-10-CM | POA: Diagnosis not present

## 2019-04-02 DIAGNOSIS — Z96643 Presence of artificial hip joint, bilateral: Secondary | ICD-10-CM | POA: Diagnosis not present

## 2019-04-04 DIAGNOSIS — R269 Unspecified abnormalities of gait and mobility: Secondary | ICD-10-CM | POA: Diagnosis not present

## 2019-04-04 DIAGNOSIS — R079 Chest pain, unspecified: Secondary | ICD-10-CM | POA: Diagnosis not present

## 2019-04-04 DIAGNOSIS — M1990 Unspecified osteoarthritis, unspecified site: Secondary | ICD-10-CM | POA: Diagnosis not present

## 2019-04-04 DIAGNOSIS — S73004A Unspecified dislocation of right hip, initial encounter: Secondary | ICD-10-CM | POA: Diagnosis not present

## 2019-04-04 DIAGNOSIS — Q6589 Other specified congenital deformities of hip: Secondary | ICD-10-CM | POA: Diagnosis not present

## 2019-04-04 DIAGNOSIS — T84020A Dislocation of internal right hip prosthesis, initial encounter: Secondary | ICD-10-CM | POA: Diagnosis not present

## 2019-04-04 DIAGNOSIS — M25551 Pain in right hip: Secondary | ICD-10-CM | POA: Diagnosis not present

## 2019-04-04 DIAGNOSIS — T07XXXA Unspecified multiple injuries, initial encounter: Secondary | ICD-10-CM | POA: Diagnosis not present

## 2019-04-04 DIAGNOSIS — R202 Paresthesia of skin: Secondary | ICD-10-CM | POA: Diagnosis not present

## 2019-04-04 DIAGNOSIS — Z880 Allergy status to penicillin: Secondary | ICD-10-CM | POA: Diagnosis not present

## 2019-04-04 DIAGNOSIS — R52 Pain, unspecified: Secondary | ICD-10-CM | POA: Diagnosis not present

## 2019-04-04 DIAGNOSIS — X58XXXA Exposure to other specified factors, initial encounter: Secondary | ICD-10-CM | POA: Diagnosis not present

## 2019-04-04 DIAGNOSIS — T84021A Dislocation of internal left hip prosthesis, initial encounter: Secondary | ICD-10-CM | POA: Diagnosis not present

## 2019-04-04 DIAGNOSIS — Z79899 Other long term (current) drug therapy: Secondary | ICD-10-CM | POA: Diagnosis not present

## 2019-04-04 DIAGNOSIS — Z4789 Encounter for other orthopedic aftercare: Secondary | ICD-10-CM | POA: Diagnosis not present

## 2019-04-04 DIAGNOSIS — E079 Disorder of thyroid, unspecified: Secondary | ICD-10-CM | POA: Diagnosis not present

## 2019-04-04 DIAGNOSIS — I10 Essential (primary) hypertension: Secondary | ICD-10-CM | POA: Diagnosis not present

## 2019-04-08 DIAGNOSIS — Z01812 Encounter for preprocedural laboratory examination: Secondary | ICD-10-CM | POA: Diagnosis not present

## 2019-04-08 DIAGNOSIS — Z471 Aftercare following joint replacement surgery: Secondary | ICD-10-CM | POA: Diagnosis not present

## 2019-04-08 DIAGNOSIS — Z96641 Presence of right artificial hip joint: Secondary | ICD-10-CM | POA: Diagnosis not present

## 2019-04-08 DIAGNOSIS — Z20822 Contact with and (suspected) exposure to covid-19: Secondary | ICD-10-CM | POA: Diagnosis not present

## 2019-04-08 DIAGNOSIS — Y792 Prosthetic and other implants, materials and accessory orthopedic devices associated with adverse incidents: Secondary | ICD-10-CM | POA: Diagnosis not present

## 2019-04-08 DIAGNOSIS — T84020A Dislocation of internal right hip prosthesis, initial encounter: Secondary | ICD-10-CM | POA: Diagnosis not present

## 2019-04-14 DIAGNOSIS — T84020A Dislocation of internal right hip prosthesis, initial encounter: Secondary | ICD-10-CM | POA: Diagnosis not present

## 2019-04-14 DIAGNOSIS — G4733 Obstructive sleep apnea (adult) (pediatric): Secondary | ICD-10-CM | POA: Diagnosis not present

## 2019-04-14 DIAGNOSIS — F329 Major depressive disorder, single episode, unspecified: Secondary | ICD-10-CM | POA: Diagnosis not present

## 2019-04-14 DIAGNOSIS — G8918 Other acute postprocedural pain: Secondary | ICD-10-CM | POA: Diagnosis not present

## 2019-04-14 DIAGNOSIS — T84090A Other mechanical complication of internal right hip prosthesis, initial encounter: Secondary | ICD-10-CM | POA: Diagnosis not present

## 2019-04-14 DIAGNOSIS — Y792 Prosthetic and other implants, materials and accessory orthopedic devices associated with adverse incidents: Secondary | ICD-10-CM | POA: Diagnosis not present

## 2019-04-14 DIAGNOSIS — F419 Anxiety disorder, unspecified: Secondary | ICD-10-CM | POA: Diagnosis not present

## 2019-04-14 DIAGNOSIS — Z9989 Dependence on other enabling machines and devices: Secondary | ICD-10-CM | POA: Diagnosis not present

## 2019-04-14 DIAGNOSIS — Z96641 Presence of right artificial hip joint: Secondary | ICD-10-CM | POA: Diagnosis not present

## 2019-04-14 DIAGNOSIS — I10 Essential (primary) hypertension: Secondary | ICD-10-CM | POA: Diagnosis not present

## 2019-04-14 DIAGNOSIS — Z471 Aftercare following joint replacement surgery: Secondary | ICD-10-CM | POA: Diagnosis not present

## 2019-04-14 DIAGNOSIS — K219 Gastro-esophageal reflux disease without esophagitis: Secondary | ICD-10-CM | POA: Diagnosis not present

## 2019-04-14 DIAGNOSIS — Z79899 Other long term (current) drug therapy: Secondary | ICD-10-CM | POA: Diagnosis not present

## 2019-04-14 DIAGNOSIS — E039 Hypothyroidism, unspecified: Secondary | ICD-10-CM | POA: Diagnosis not present

## 2019-04-14 DIAGNOSIS — Z7409 Other reduced mobility: Secondary | ICD-10-CM | POA: Diagnosis not present

## 2019-04-15 DIAGNOSIS — E039 Hypothyroidism, unspecified: Secondary | ICD-10-CM | POA: Diagnosis not present

## 2019-04-15 DIAGNOSIS — Z9989 Dependence on other enabling machines and devices: Secondary | ICD-10-CM | POA: Diagnosis not present

## 2019-04-15 DIAGNOSIS — T84090A Other mechanical complication of internal right hip prosthesis, initial encounter: Secondary | ICD-10-CM | POA: Diagnosis not present

## 2019-04-15 DIAGNOSIS — K219 Gastro-esophageal reflux disease without esophagitis: Secondary | ICD-10-CM | POA: Diagnosis not present

## 2019-04-15 DIAGNOSIS — G4733 Obstructive sleep apnea (adult) (pediatric): Secondary | ICD-10-CM | POA: Diagnosis not present

## 2019-04-15 DIAGNOSIS — Z7409 Other reduced mobility: Secondary | ICD-10-CM | POA: Diagnosis not present

## 2019-04-15 DIAGNOSIS — Y792 Prosthetic and other implants, materials and accessory orthopedic devices associated with adverse incidents: Secondary | ICD-10-CM | POA: Diagnosis not present

## 2019-04-15 DIAGNOSIS — Z96641 Presence of right artificial hip joint: Secondary | ICD-10-CM | POA: Diagnosis not present

## 2019-04-15 DIAGNOSIS — I10 Essential (primary) hypertension: Secondary | ICD-10-CM | POA: Diagnosis not present

## 2019-04-15 DIAGNOSIS — F329 Major depressive disorder, single episode, unspecified: Secondary | ICD-10-CM | POA: Diagnosis not present

## 2019-04-15 DIAGNOSIS — Z79899 Other long term (current) drug therapy: Secondary | ICD-10-CM | POA: Diagnosis not present

## 2019-04-15 DIAGNOSIS — F419 Anxiety disorder, unspecified: Secondary | ICD-10-CM | POA: Diagnosis not present

## 2019-04-16 DIAGNOSIS — G4733 Obstructive sleep apnea (adult) (pediatric): Secondary | ICD-10-CM | POA: Diagnosis not present

## 2019-04-16 DIAGNOSIS — Y792 Prosthetic and other implants, materials and accessory orthopedic devices associated with adverse incidents: Secondary | ICD-10-CM | POA: Diagnosis not present

## 2019-04-16 DIAGNOSIS — Z79899 Other long term (current) drug therapy: Secondary | ICD-10-CM | POA: Diagnosis not present

## 2019-04-16 DIAGNOSIS — T84090A Other mechanical complication of internal right hip prosthesis, initial encounter: Secondary | ICD-10-CM | POA: Diagnosis not present

## 2019-04-16 DIAGNOSIS — F605 Obsessive-compulsive personality disorder: Secondary | ICD-10-CM | POA: Diagnosis not present

## 2019-04-16 DIAGNOSIS — K219 Gastro-esophageal reflux disease without esophagitis: Secondary | ICD-10-CM | POA: Diagnosis not present

## 2019-04-16 DIAGNOSIS — F419 Anxiety disorder, unspecified: Secondary | ICD-10-CM | POA: Diagnosis not present

## 2019-04-16 DIAGNOSIS — Z96641 Presence of right artificial hip joint: Secondary | ICD-10-CM | POA: Diagnosis not present

## 2019-04-16 DIAGNOSIS — F411 Generalized anxiety disorder: Secondary | ICD-10-CM | POA: Diagnosis not present

## 2019-04-16 DIAGNOSIS — F333 Major depressive disorder, recurrent, severe with psychotic symptoms: Secondary | ICD-10-CM | POA: Diagnosis not present

## 2019-04-16 DIAGNOSIS — F329 Major depressive disorder, single episode, unspecified: Secondary | ICD-10-CM | POA: Diagnosis not present

## 2019-04-16 DIAGNOSIS — E039 Hypothyroidism, unspecified: Secondary | ICD-10-CM | POA: Diagnosis not present

## 2019-04-16 DIAGNOSIS — I10 Essential (primary) hypertension: Secondary | ICD-10-CM | POA: Diagnosis not present

## 2019-04-16 DIAGNOSIS — Z7409 Other reduced mobility: Secondary | ICD-10-CM | POA: Diagnosis not present

## 2019-04-30 DIAGNOSIS — F411 Generalized anxiety disorder: Secondary | ICD-10-CM | POA: Diagnosis not present

## 2019-04-30 DIAGNOSIS — F605 Obsessive-compulsive personality disorder: Secondary | ICD-10-CM | POA: Diagnosis not present

## 2019-04-30 DIAGNOSIS — F333 Major depressive disorder, recurrent, severe with psychotic symptoms: Secondary | ICD-10-CM | POA: Diagnosis not present

## 2019-05-05 DIAGNOSIS — M47816 Spondylosis without myelopathy or radiculopathy, lumbar region: Secondary | ICD-10-CM | POA: Diagnosis not present

## 2019-05-11 DIAGNOSIS — M25551 Pain in right hip: Secondary | ICD-10-CM | POA: Diagnosis not present

## 2019-05-11 DIAGNOSIS — Z96649 Presence of unspecified artificial hip joint: Secondary | ICD-10-CM | POA: Diagnosis not present

## 2019-05-11 DIAGNOSIS — M47816 Spondylosis without myelopathy or radiculopathy, lumbar region: Secondary | ICD-10-CM | POA: Diagnosis not present

## 2019-05-11 DIAGNOSIS — R262 Difficulty in walking, not elsewhere classified: Secondary | ICD-10-CM | POA: Diagnosis not present

## 2019-05-11 DIAGNOSIS — M545 Low back pain: Secondary | ICD-10-CM | POA: Diagnosis not present

## 2019-05-11 DIAGNOSIS — Z7409 Other reduced mobility: Secondary | ICD-10-CM | POA: Diagnosis not present

## 2019-05-13 DIAGNOSIS — R262 Difficulty in walking, not elsewhere classified: Secondary | ICD-10-CM | POA: Diagnosis not present

## 2019-05-13 DIAGNOSIS — Z7409 Other reduced mobility: Secondary | ICD-10-CM | POA: Diagnosis not present

## 2019-05-13 DIAGNOSIS — M545 Low back pain: Secondary | ICD-10-CM | POA: Diagnosis not present

## 2019-05-13 DIAGNOSIS — Z96649 Presence of unspecified artificial hip joint: Secondary | ICD-10-CM | POA: Diagnosis not present

## 2019-05-22 DIAGNOSIS — Z96649 Presence of unspecified artificial hip joint: Secondary | ICD-10-CM | POA: Diagnosis not present

## 2019-05-22 DIAGNOSIS — M25551 Pain in right hip: Secondary | ICD-10-CM | POA: Diagnosis not present

## 2019-05-22 DIAGNOSIS — R262 Difficulty in walking, not elsewhere classified: Secondary | ICD-10-CM | POA: Diagnosis not present

## 2019-05-22 DIAGNOSIS — M6281 Muscle weakness (generalized): Secondary | ICD-10-CM | POA: Diagnosis not present

## 2019-05-24 ENCOUNTER — Other Ambulatory Visit: Payer: Self-pay | Admitting: Family Medicine

## 2019-05-24 DIAGNOSIS — M797 Fibromyalgia: Secondary | ICD-10-CM

## 2019-05-25 DIAGNOSIS — Z96649 Presence of unspecified artificial hip joint: Secondary | ICD-10-CM | POA: Diagnosis not present

## 2019-05-25 DIAGNOSIS — Z7409 Other reduced mobility: Secondary | ICD-10-CM | POA: Diagnosis not present

## 2019-05-25 DIAGNOSIS — M25551 Pain in right hip: Secondary | ICD-10-CM | POA: Diagnosis not present

## 2019-05-25 DIAGNOSIS — M545 Low back pain: Secondary | ICD-10-CM | POA: Diagnosis not present

## 2019-05-26 ENCOUNTER — Ambulatory Visit (INDEPENDENT_AMBULATORY_CARE_PROVIDER_SITE_OTHER): Payer: BC Managed Care – PPO | Admitting: Family Medicine

## 2019-05-26 ENCOUNTER — Encounter: Payer: Self-pay | Admitting: Family Medicine

## 2019-05-26 ENCOUNTER — Other Ambulatory Visit: Payer: Self-pay

## 2019-05-26 VITALS — BP 138/76 | HR 96 | Temp 98.7°F | Wt 199.4 lb

## 2019-05-26 DIAGNOSIS — R319 Hematuria, unspecified: Secondary | ICD-10-CM | POA: Diagnosis not present

## 2019-05-26 LAB — POCT URINALYSIS DIPSTICK
Glucose, UA: POSITIVE — AB
Nitrite, UA: POSITIVE
Protein, UA: POSITIVE — AB
Spec Grav, UA: 1.015 (ref 1.010–1.025)
Urobilinogen, UA: >=8 E.U./dL — AB
pH, UA: 5.5 (ref 5.0–8.0)

## 2019-05-26 MED ORDER — SULFAMETHOXAZOLE-TRIMETHOPRIM 800-160 MG PO TABS: 1.0000 | ORAL_TABLET | Freq: Two times a day (BID) | ORAL | 0 refills | Status: AC

## 2019-05-26 NOTE — Patient Instructions (Signed)
Sulfamethoxazole; Trimethoprim, SMX-TMP tablets What is this medicine? SULFAMETHOXAZOLE; TRIMETHOPRIM or SMX-TMP (suhl fuh meth OK suh zohl; trye METH oh prim) is a combination of a sulfonamide antibiotic and a second antibiotic, trimethoprim. It is used to treat or prevent certain kinds of bacterial infections. It will not work for colds, flu, or other viral infections. This medicine may be used for other purposes; ask your health care provider or pharmacist if you have questions. COMMON BRAND NAME(S): Bacter-Aid DS, Bactrim, Bactrim DS, Septra, Septra DS What should I tell my health care provider before I take this medicine? They need to know if you have any of these conditions:  G6PD deficiency  HIV or AIDS  kidney disease  liver disease  low platelets  low red blood cell counts  poor nutrition  stomach or intestine problems like colitis  thyroid disease  an unusual or allergic reaction to sulfamethoxazole, trimethoprim, sulfa drugs, other drugs, foods, dyes, or preservatives  pregnant or trying to get pregnant  breast-feeding How should I use this medicine? Take this medicine by mouth with a glass of water. Follow the directions on the prescription label. Take your medicine at regular intervals. Do not take it more often than directed. Take all of your medicine as directed even if you think you are better. Do not skip doses or stop your medicine early. Talk to your pediatrician regarding the use of this medicine in children. While this drug may be prescribed for children as young as 2 months for selected conditions, precautions do apply. Overdosage: If you think you have taken too much of this medicine contact a poison control center or emergency room at once. NOTE: This medicine is only for you. Do not share this medicine with others. What if I miss a dose? If you miss a dose, take it as soon as you can. If it is almost time for your next dose, take only that dose. Do not  take double or extra doses. What may interact with this medicine? Do not take this medicine with any of the following medications:  dofetilide This medicine may also interact with the following medications:  amantadine  birth control pills  certain medicines for blood pressure, heart disease  certain medicines for depression, like amitriptyline  certain medicines for diabetes, like glipizide or glyburide  certain medicines that treat or prevent blood clots like warfarin  cyclosporine  digoxin  diuretics  indomethacin  methotrexate  phenytoin  procainamide  pyrimethamine  zidovudine This list may not describe all possible interactions. Give your health care provider a list of all the medicines, herbs, non-prescription drugs, or dietary supplements you use. Also tell them if you smoke, drink alcohol, or use illegal drugs. Some items may interact with your medicine. What should I watch for while using this medicine? Tell your health care provider if your symptoms do not start to get better or if they get worse. Do not treat diarrhea with over the counter products. Contact your health care provider if you have diarrhea that lasts more than 2 days or if it is severe and watery. This drug may cause serious skin reactions. They can happen weeks to months after starting the drug. Contact your health care provider right away if you notice fevers or flu-like symptoms with a rash. The rash may be red or purple and then turn into blisters or peeling of the skin. Or, you might notice a red rash with swelling of the face, lips or lymph nodes in your neck   or under your arms. This drug can make you more sensitive to the sun. Keep out of the sun. If you cannot avoid being in the sun, wear protective clothing and sunscreen. Do not use sun lamps or tanning beds/booths. Be careful brushing or flossing your teeth or using a toothpick because you may get an infection or bleed more easily. If you  have any dental work done, tell your dentist you are receiving this drug. What side effects may I notice from receiving this medicine? Side effects that you should report to your doctor or health care professional as soon as possible:  allergic reactions (skin rash, itching or hives; swelling of the face, lips, or tongue)  bloody or watery diarrhea  heartbeat rhythm changes (trouble breathing; chest pain; dizziness; fast, irregular heartbeat; feeling faint or lightheaded, falls,)  fever  high potassium levels (chest pain; fast, irregular heartbeat; muscle weakness)  kidney injury (trouble passing urine or change in the amount of urine)  low blood sugar (feeling anxious; confusion; dizziness; increased hunger; unusually weak or tired; increased sweating; shakiness; cold, clammy skin; irritable; headache; blurred vision; fast heartbeat; loss of consciousness)  low red blood cell counts (trouble breathing; feeling faint; lightheaded, falls; unusually weak or tired)  rash, fever, and swollen lymph nodes  redness, blistering, peeling, or loosening of the skin, including inside the mouth  unusual bruising or bleeding Side effects that usually do not require medical attention (report to your doctor or health care professional if they continue or are bothersome):  loss of appetite  nausea  vomiting This list may not describe all possible side effects. Call your doctor for medical advice about side effects. You may report side effects to FDA at 1-800-FDA-1088. Where should I keep my medicine? Keep out of the reach of children. Store between 15 and 25 degrees C (59 to 77 degrees F). Protect from light. Keep the container tightly closed. Throw away any unused drug after the expiration date. NOTE: This sheet is a summary. It may not cover all possible information. If you have questions about this medicine, talk to your doctor, pharmacist, or health care provider.  2020 Elsevier/Gold  Standard (2018-11-14 12:53:51) Interstitial Cystitis  Interstitial cystitis is inflammation of the bladder. This may cause pain in the bladder area as well as a frequent and urgent need to urinate. The bladder is a hollow organ in the lower part of the abdomen. It stores urine after the urine is made in the kidneys. The severity of interstitial cystitis can vary from person to person. You may have flare-ups, and then your symptoms may go away for a while. For many people, it becomes a long-term (chronic) problem. What are the causes? The cause of this condition is not known. What increases the risk? The following factors may make you more likely to develop this condition:  You are female.  You have fibromyalgia.  You have irritable bowel syndrome (IBS).  You have endometriosis. This condition may be aggravated by:  Stress.  Smoking.  Spicy foods. What are the signs or symptoms? Symptoms of interstitial cystitis vary, and they can change over time. Symptoms may include:  Discomfort or pain in the bladder area, which is in the lower abdomen. Pain can range from mild to severe. The pain may change in intensity as the bladder fills with urine or as it empties.  Pain in the pelvic area, between the hip bones.  An urgent need to urinate.  Frequent urination.  Pain during urination.  Pain during sex.  Blood in the urine. For women, symptoms often get worse during menstruation. How is this diagnosed? This condition is diagnosed based on your symptoms, your medical history, and a physical exam. You may have tests to rule out other conditions, such as:  Urine tests.  Cystoscopy. For this test, a tool similar to a very thin telescope is used to look into your bladder.  Biopsy. This involves taking a sample of tissue from the bladder to be examined under a microscope. How is this treated? There is no cure for this condition, but treatment can help you control your symptoms. Work  closely with your health care provider to find the most effective treatments for you. Treatment options may include:  Medicines to relieve pain and reduce how often you feel the need to urinate.  Learning ways to control when you urinate (bladder training).  Lifestyle changes, such as changing your diet or taking steps to control stress.  Using a device that provides electrical stimulation to your nerves, which can relieve pain (neuromodulation therapy). The device is placed on your back, where it blocks the nerves that cause you to feel pain in your bladder area.  A procedure that stretches your bladder by filling it with air or fluid.  Surgery. This is rare. It is only done for extreme cases, if other treatments do not help. Follow these instructions at home: Bladder training   Use bladder training techniques as directed. Techniques may include: ? Urinating at scheduled times. ? Training yourself to delay urination. ? Doing exercises (Kegel exercises) to strengthen the muscles that control urine flow.  Keep a bladder diary. ? Write down the times that you urinate and any symptoms that you have. This can help you find out which foods, liquids, or activities make your symptoms worse. ? Use your bladder diary to schedule bathroom trips. If you are away from home, plan to be near a bathroom at each of your scheduled times.  Make sure that you urinate just before you leave the house and just before you go to bed. Eating and drinking  Make dietary changes as recommended by your health care provider. You may need to avoid: ? Spicy foods. ? Foods that contain a lot of potassium.  Limit your intake of beverages that make you need to urinate. These include: ? Caffeinated beverages like soda, coffee, and tea. ? Alcohol. General instructions  Take over-the-counter and prescription medicines only as told by your health care provider.  Do not drink alcohol.  You can try a warm or cool  compress over your bladder for comfort.  Avoid wearing tight clothing.  Do not use any products that contain nicotine or tobacco, such as cigarettes and e-cigarettes. If you need help quitting, ask your health care provider.  Keep all follow-up visits as told by your health care provider. This is important. Contact a health care provider if you have:  Symptoms that do not get better with treatment.  Pain or discomfort that gets worse.  More frequent urges to urinate.  A fever. Get help right away if:  You have no control over when you urinate. Summary  Interstitial cystitis is inflammation of the bladder.  This condition may cause pain in the bladder area as well as a frequent and urgent need to urinate.  You may have flare-ups of the condition, and then it may go away for a while. For many people, it becomes a long-term (chronic) problem.  There is no  cure for interstitial cystitis, but treatment methods are available to control your symptoms. This information is not intended to replace advice given to you by your health care provider. Make sure you discuss any questions you have with your health care provider. Document Revised: 02/08/2017 Document Reviewed: 01/21/2017 Elsevier Patient Education  2020 [employer].

## 2019-05-26 NOTE — Progress Notes (Signed)
Acute Office Visit  Subjective:    Patient ID: Abigail Gomez, female    DOB: 1965/08/22, 54 y.o.   MRN: [id number]  Chief Complaint  Patient presents with  . Urinary Frequency    started yesterday with blood in urine   . Blood In Stools    started yesterday also     HPI Patient is in today for urinary frequency, dysuria, and hematuria that started yesterday. She states her symptoms started 2 days after having sexual intercourse. Reports this occurred last November also after sexual intercourse and cleared with a course of antibiotics. She denies fever. Denies vaginal discharge. She has had a full hysterectomy in the past. Reports she has taken Azo tablets today and hydrates well.   Past Medical History:  Diagnosis Date  . Allergy   . Anemia   . Anxiety   . Depression   . Disease    lymes disease  . Fibromyalgia   . H/o Lyme disease   . H/O sickle cell trait   . History of pelvic fracture   . History of urinary incontinence   . Hypertension   . Hypothyroidism   . Insomnia   . Labral tear of hip joint    Right Hip  . Migraine   . Osteoarthritis   . RLS (restless legs syndrome)   . Sleep apnea    cpap  . Spinal stenosis   . Thyroid disease   . TMJ syndrome   . Vitamin D deficiency     Past Surgical History:  Procedure Laterality Date  . ABDOMINAL HYSTERECTOMY    . BUNIONECTOMY Bilateral   . chiari malformation    . ENDOMETRIAL ABLATION    . MYOMECTOMY    . OOPHORECTOMY    . TONSILLECTOMY AND ADENOIDECTOMY    . TOTAL HIP ARTHROPLASTY Right 02/28/2016  . TOTAL HIP ARTHROPLASTY Right 02/28/2016   Procedure: RIGHT TOTAL HIP ARTHROPLASTY ANTERIOR APPROACH;  Surgeon: Kathryne Hitch, MD;  Location: [facility] OR;  Service: Orthopedics;  Laterality: Right;    Family History  Problem Relation Age of Onset  . Hyperlipidemia Mother   . Diabetes Father   . Hypertension Father   . Cancer Father        prostate  . Diabetes Sister   . Hypertension Sister   .  Arthritis Maternal Grandmother   . Stroke Maternal Grandmother   . Hypertension Maternal Grandmother   . Hyperlipidemia Maternal Grandmother   . Hyperkalemia Maternal Grandmother   . Heart disease Maternal Grandmother   . Arthritis Paternal Grandmother   . Diabetes Sister     Social History   Socioeconomic History  . Marital status: Single    Spouse name: Not on file  . Number of children: Not on file  . Years of education: Not on file  . Highest education level: Not on file  Occupational History  . Not on file  Tobacco Use  . Smoking status: Never Smoker  . Smokeless tobacco: Never Used  Substance and Sexual Activity  . Alcohol use: Yes    Alcohol/week: 0.0 standard drinks    Comment: occ  . Drug use: No  . Sexual activity: Not on file  Other Topics Concern  . Not on file  Social History Narrative  . Not on file   Social Determinants of Health   Financial Resource Strain:   . Difficulty of Paying Living Expenses:   Food Insecurity:   . Worried About Programme researcher, broadcasting/film/video in the  Last Year:   . Ran Out of Food in the Last Year:   Transportation Needs:   . Freight forwarder (Medical):   Marland Kitchen Lack of Transportation (Non-Medical):   Physical Activity:   . Days of Exercise per Week:   . Minutes of Exercise per Session:   Stress:   . Feeling of Stress :   Social Connections:   . Frequency of Communication with Friends and Family:   . Frequency of Social Gatherings with Friends and Family:   . Attends Religious Services:   . Active Member of Clubs or Organizations:   . Attends Banker Meetings:   Marland Kitchen Marital Status:   Intimate Partner Violence:   . Fear of Current or Ex-Partner:   . Emotionally Abused:   Marland Kitchen Physically Abused:   . Sexually Abused:     Outpatient Medications Prior to Visit  Medication Sig Dispense Refill  . amitriptyline (ELAVIL) 10 MG tablet Take 4 tablets (40 mg total) by mouth at bedtime. 360 tablet 1  . ARMOUR THYROID 30 MG  tablet Take 1 tablet (30 mg total) by mouth daily. 90 tablet 1  . cholecalciferol (VITAMIN D) 1000 units tablet Take 5,000 Units by mouth daily.     Marland Kitchen estradiol (CLIMARA - DOSED IN MG/24 HR) 0.1 mg/24hr patch Place 0.1 mg onto the skin 2 (two) times a week.    . fluticasone (FLONASE) 50 MCG/ACT nasal spray Place 1 spray into both nostrils daily. 16 g 6  . lidocaine (LIDODERM) 5 % Place 1 patch onto the skin daily. Remove & Discard patch within 12 hours or as directed by MD 90 patch 3  . losartan-hydrochlorothiazide (HYZAAR) 100-25 MG tablet TAKE 1/2 TABLET(50MG /12.5MG ) BY MOUTH EVERY DAY 45 tablet 1  . losartan-hydrochlorothiazide (HYZAAR) 50-12.5 MG tablet Take 1 tablet by mouth daily. 90 tablet 3  . Lurasidone HCl (LATUDA PO) Take 30 mg by mouth daily.    . Magnesium Amino Acid Chelate 20 % POWD Take 450 mg by mouth at bedtime.    . Melatonin 10 MG TABS Take 10 mg by mouth at bedtime.     . metoprolol tartrate (LOPRESSOR) 50 MG tablet Take 1 tablet (50 mg total) by mouth 2 (two) times daily. 180 tablet 3  . rizatriptan (MAXALT-MLT) 10 MG disintegrating tablet Take 1 tablet (10 mg total) by mouth daily as needed for migraine. 10 tablet 5  . tiZANidine (ZANAFLEX) 4 MG capsule TAKE 1 CAPSULE (4 MG TOTAL) BY MOUTH 2 (TWO) TIMES DAILY AS NEEDED FOR MUSCLE SPASMS 180 capsule 1  . VITAMIN B COMPLEX-C PO Take 1 capsule by mouth daily.    . Vortioxetine HBr (TRINTELLIX) 20 MG TABS Take 20 mg by mouth daily.     Marland Kitchen zonisamide (ZONEGRAN) 50 MG capsule Take 150 mg by mouth at bedtime.     No facility-administered medications prior to visit.    Allergies  Allergen Reactions  . Peanut Oil Anaphylaxis  . Penicillins Other (See Comments) and Anaphylaxis    Syncope Has patient had a PCN reaction causing immediate rash, facial/tongue/throat swelling, SOB or lightheadedness with hypotension:Yes Has patient had a PCN reaction causing severe rash involving mucus membranes or skin necrosis:No Has patient had a  PCN reaction that required hospitalization:No Has patient had a PCN reaction occurring within the last 10 years:No If all of the above answers are "NO", then may proceed with Cephalosporin use.   . Food     Nuts-itching/swelling Bananas-itching/swelling  . Lactose Intolerance (Gi)  Other (See Comments)    G.I. upset  . Lactulose Other (See Comments)    G.I. upset  . Lisinopril Cough  . Other Itching    Nuts-itching/swelling Bananas-itching/swelling  . Oxycodone-Acetaminophen Itching and Other (See Comments)    Review of Systems  General: Denies fever, chills, night sweats, changes in weight, changes in appetite HEENT: Denies headache CV: Denies CP, palpitations, SOB, orthopnea Pulm: Denies SOB, cough GI: Denies abdominal pain, nausea, vomiting, diarrhea. GU: Positive for dysuria, hematuria, frequency. Negative for vaginal discharge. Msk: Denies muscle cramps, pain Neuro: Denies weakness, numbness, tingling Skin: Denies rashes, bruising     Objective:    Physical Exam GEN: Well nourished, well developed, in no acute distress, Alert and interactive HEENT: normocephalic, atraumatic. EOMI, good conjugate gaze. Nares symmetric, patent. Moist mucous membranes Neck: Supple, no JVD or masses. Normal ROM Cardiac: pulse 2+, no edema  Respiratory:  clear to auscultation bilaterally, normal work of breathing GI: soft, mild tenderness over bladder, nondistended, + BS, no masses MS: Normal tone and ROM, strength and sensation intact, cap refill <2sec, no deformity or atrophy Skin: Intact, warm and dry, no rashes, no lesions, no erythema Neuro:  Alert and Oriented x 3, Reflex symmetric, Sensation intact, follows commands, gait normal Psych: euthymic mood, full affect   BP 138/76 (BP Location: Right Arm, Patient Position: Sitting, Cuff Size: Normal)   Pulse 96   Temp 98.7 F (37.1 C) (Temporal)   Wt 199 lb 6.4 oz (90.4 kg)   SpO2 95%   BMI 30.32 kg/m  Wt Readings from Last 3  Encounters:  05/26/19 199 lb 6.4 oz (90.4 kg)  12/24/18 194 lb (88 kg)  10/30/17 194 lb (88 kg)    Health Maintenance Due  Topic Date Due  . HIV Screening  Never done  . PAP SMEAR-Modifier  03/03/2017  . MAMMOGRAM  09/22/2017  . COLONOSCOPY  Never done  . TETANUS/TDAP  02/26/2018  . INFLUENZA VACCINE  10/11/2018    There are no preventive care reminders to display for this patient.   Lab Results  Component Value Date   TSH 1.68 11/10/2018   Lab Results  Component Value Date   WBC 5.8 11/10/2018   HGB 12.4 11/10/2018   HCT 35.9 (L) 11/10/2018   MCV 81.7 11/10/2018   PLT 174.0 11/10/2018   Lab Results  Component Value Date   NA 139 11/10/2018   K 3.9 11/10/2018   CO2 27 11/10/2018   GLUCOSE 87 11/10/2018   BUN 11 11/10/2018   CREATININE 1.05 11/10/2018   BILITOT 0.6 11/10/2018   ALKPHOS 81 11/10/2018   AST 33 11/10/2018   ALT 28 11/10/2018   PROT 6.3 11/10/2018   ALBUMIN 3.9 11/10/2018   CALCIUM 9.2 11/10/2018   ANIONGAP 6 02/29/2016   GFR 66.82 11/10/2018   Lab Results  Component Value Date   CHOL 174 11/10/2018   Lab Results  Component Value Date   HDL 52.10 11/10/2018   Lab Results  Component Value Date   LDLCALC 101 (H) 11/10/2018   Lab Results  Component Value Date   TRIG 103.0 11/10/2018   Lab Results  Component Value Date   CHOLHDL 3 11/10/2018   Lab Results  Component Value Date   HGBA1C 5.1 08/23/2014       Assessment & Plan:   Problem List Items Addressed This Visit    None     ASSESSMENT: Urine dipstick shows urine is positive for infection. Patient's symptoms of dysuria, hematuria, and  frequency concur with diagnosis.  PLAN: Uncomplicated cystitis. Urine sent for culture. Will start Bactrim DS 800-160 mg twice daily for 5 days. Will give additional four tablets for patient to take prophylactic after future sexual encounters. Advised patient to call back if symptoms do not improve with treatment.   No orders of the  defined types were placed in this encounter.    Abigail Life, RN, BSN AGPCNP Student, [employer]  Agree with above.  Pt assessed with student.  Urine dip highly suggests UTI and pt non-toxic.  She is describing what sounds like post-coital UTI.  She will take one tablet of Septra DS after intercourse as prophylaxis.  Eulas Post MD [facility] at [facility]

## 2019-05-28 DIAGNOSIS — Z96641 Presence of right artificial hip joint: Secondary | ICD-10-CM | POA: Diagnosis not present

## 2019-05-28 DIAGNOSIS — Z471 Aftercare following joint replacement surgery: Secondary | ICD-10-CM | POA: Diagnosis not present

## 2019-05-28 LAB — URINE CULTURE
MICRO NUMBER:: 10256918
SPECIMEN QUALITY:: ADEQUATE

## 2019-06-25 DIAGNOSIS — F605 Obsessive-compulsive personality disorder: Secondary | ICD-10-CM | POA: Diagnosis not present

## 2019-06-25 DIAGNOSIS — F411 Generalized anxiety disorder: Secondary | ICD-10-CM | POA: Diagnosis not present

## 2019-06-25 DIAGNOSIS — F333 Major depressive disorder, recurrent, severe with psychotic symptoms: Secondary | ICD-10-CM | POA: Diagnosis not present

## 2019-06-28 ENCOUNTER — Other Ambulatory Visit: Payer: Self-pay | Admitting: Family Medicine

## 2019-07-07 DIAGNOSIS — Z1231 Encounter for screening mammogram for malignant neoplasm of breast: Secondary | ICD-10-CM | POA: Diagnosis not present

## 2019-07-07 DIAGNOSIS — Z01419 Encounter for gynecological examination (general) (routine) without abnormal findings: Secondary | ICD-10-CM | POA: Diagnosis not present

## 2019-07-07 DIAGNOSIS — Z113 Encounter for screening for infections with a predominantly sexual mode of transmission: Secondary | ICD-10-CM | POA: Diagnosis not present

## 2019-07-07 DIAGNOSIS — N390 Urinary tract infection, site not specified: Secondary | ICD-10-CM | POA: Diagnosis not present

## 2019-07-07 DIAGNOSIS — Z6828 Body mass index (BMI) 28.0-28.9, adult: Secondary | ICD-10-CM | POA: Diagnosis not present

## 2019-07-14 DIAGNOSIS — R1310 Dysphagia, unspecified: Secondary | ICD-10-CM | POA: Diagnosis not present

## 2019-07-14 DIAGNOSIS — Z1211 Encounter for screening for malignant neoplasm of colon: Secondary | ICD-10-CM | POA: Diagnosis not present

## 2019-07-14 DIAGNOSIS — K219 Gastro-esophageal reflux disease without esophagitis: Secondary | ICD-10-CM | POA: Diagnosis not present

## 2019-07-31 DIAGNOSIS — K219 Gastro-esophageal reflux disease without esophagitis: Secondary | ICD-10-CM | POA: Diagnosis not present

## 2019-07-31 DIAGNOSIS — Z1211 Encounter for screening for malignant neoplasm of colon: Secondary | ICD-10-CM | POA: Diagnosis not present

## 2019-07-31 DIAGNOSIS — K2970 Gastritis, unspecified, without bleeding: Secondary | ICD-10-CM | POA: Diagnosis not present

## 2019-07-31 DIAGNOSIS — R1310 Dysphagia, unspecified: Secondary | ICD-10-CM | POA: Diagnosis not present

## 2019-08-12 ENCOUNTER — Other Ambulatory Visit: Payer: Self-pay | Admitting: Family Medicine

## 2019-08-12 DIAGNOSIS — M797 Fibromyalgia: Secondary | ICD-10-CM

## 2019-08-27 DIAGNOSIS — F605 Obsessive-compulsive personality disorder: Secondary | ICD-10-CM | POA: Diagnosis not present

## 2019-08-27 DIAGNOSIS — F411 Generalized anxiety disorder: Secondary | ICD-10-CM | POA: Diagnosis not present

## 2019-08-27 DIAGNOSIS — F333 Major depressive disorder, recurrent, severe with psychotic symptoms: Secondary | ICD-10-CM | POA: Diagnosis not present

## 2019-10-12 DIAGNOSIS — Z23 Encounter for immunization: Secondary | ICD-10-CM | POA: Diagnosis not present

## 2019-10-14 ENCOUNTER — Other Ambulatory Visit: Payer: Self-pay | Admitting: Family Medicine

## 2019-10-14 DIAGNOSIS — M797 Fibromyalgia: Secondary | ICD-10-CM

## 2019-10-22 DIAGNOSIS — Z96641 Presence of right artificial hip joint: Secondary | ICD-10-CM | POA: Diagnosis not present

## 2019-10-22 DIAGNOSIS — Z96643 Presence of artificial hip joint, bilateral: Secondary | ICD-10-CM | POA: Diagnosis not present

## 2019-10-22 DIAGNOSIS — Z471 Aftercare following joint replacement surgery: Secondary | ICD-10-CM | POA: Diagnosis not present

## 2019-11-10 DIAGNOSIS — R262 Difficulty in walking, not elsewhere classified: Secondary | ICD-10-CM | POA: Diagnosis not present

## 2019-11-10 DIAGNOSIS — M25551 Pain in right hip: Secondary | ICD-10-CM | POA: Diagnosis not present

## 2019-11-10 DIAGNOSIS — Z96649 Presence of unspecified artificial hip joint: Secondary | ICD-10-CM | POA: Diagnosis not present

## 2019-11-10 DIAGNOSIS — M545 Low back pain: Secondary | ICD-10-CM | POA: Diagnosis not present

## 2019-11-12 DIAGNOSIS — M545 Low back pain: Secondary | ICD-10-CM | POA: Diagnosis not present

## 2019-11-12 DIAGNOSIS — M25551 Pain in right hip: Secondary | ICD-10-CM | POA: Diagnosis not present

## 2019-11-12 DIAGNOSIS — Z96649 Presence of unspecified artificial hip joint: Secondary | ICD-10-CM | POA: Diagnosis not present

## 2019-11-12 DIAGNOSIS — R262 Difficulty in walking, not elsewhere classified: Secondary | ICD-10-CM | POA: Diagnosis not present

## 2019-11-18 DIAGNOSIS — M545 Low back pain: Secondary | ICD-10-CM | POA: Diagnosis not present

## 2019-11-18 DIAGNOSIS — Z96649 Presence of unspecified artificial hip joint: Secondary | ICD-10-CM | POA: Diagnosis not present

## 2019-11-18 DIAGNOSIS — M6281 Muscle weakness (generalized): Secondary | ICD-10-CM | POA: Diagnosis not present

## 2019-11-18 DIAGNOSIS — M25551 Pain in right hip: Secondary | ICD-10-CM | POA: Diagnosis not present

## 2019-11-25 DIAGNOSIS — R262 Difficulty in walking, not elsewhere classified: Secondary | ICD-10-CM | POA: Diagnosis not present

## 2019-11-25 DIAGNOSIS — Z96649 Presence of unspecified artificial hip joint: Secondary | ICD-10-CM | POA: Diagnosis not present

## 2019-11-25 DIAGNOSIS — M6281 Muscle weakness (generalized): Secondary | ICD-10-CM | POA: Diagnosis not present

## 2019-11-25 DIAGNOSIS — M545 Low back pain: Secondary | ICD-10-CM | POA: Diagnosis not present

## 2019-12-02 DIAGNOSIS — Z96649 Presence of unspecified artificial hip joint: Secondary | ICD-10-CM | POA: Diagnosis not present

## 2019-12-02 DIAGNOSIS — R262 Difficulty in walking, not elsewhere classified: Secondary | ICD-10-CM | POA: Diagnosis not present

## 2019-12-02 DIAGNOSIS — M545 Low back pain: Secondary | ICD-10-CM | POA: Diagnosis not present

## 2019-12-02 DIAGNOSIS — M6281 Muscle weakness (generalized): Secondary | ICD-10-CM | POA: Diagnosis not present

## 2019-12-03 DIAGNOSIS — F411 Generalized anxiety disorder: Secondary | ICD-10-CM | POA: Diagnosis not present

## 2019-12-03 DIAGNOSIS — F333 Major depressive disorder, recurrent, severe with psychotic symptoms: Secondary | ICD-10-CM | POA: Diagnosis not present

## 2019-12-07 DIAGNOSIS — M6281 Muscle weakness (generalized): Secondary | ICD-10-CM | POA: Diagnosis not present

## 2019-12-07 DIAGNOSIS — M545 Low back pain: Secondary | ICD-10-CM | POA: Diagnosis not present

## 2019-12-07 DIAGNOSIS — Z96649 Presence of unspecified artificial hip joint: Secondary | ICD-10-CM | POA: Diagnosis not present

## 2019-12-07 DIAGNOSIS — R262 Difficulty in walking, not elsewhere classified: Secondary | ICD-10-CM | POA: Diagnosis not present

## 2019-12-16 DIAGNOSIS — M545 Low back pain, unspecified: Secondary | ICD-10-CM | POA: Diagnosis not present

## 2019-12-16 DIAGNOSIS — M6281 Muscle weakness (generalized): Secondary | ICD-10-CM | POA: Diagnosis not present

## 2019-12-16 DIAGNOSIS — R262 Difficulty in walking, not elsewhere classified: Secondary | ICD-10-CM | POA: Diagnosis not present

## 2019-12-16 DIAGNOSIS — Z96649 Presence of unspecified artificial hip joint: Secondary | ICD-10-CM | POA: Diagnosis not present

## 2019-12-23 DIAGNOSIS — R262 Difficulty in walking, not elsewhere classified: Secondary | ICD-10-CM | POA: Diagnosis not present

## 2019-12-23 DIAGNOSIS — Z96649 Presence of unspecified artificial hip joint: Secondary | ICD-10-CM | POA: Diagnosis not present

## 2019-12-23 DIAGNOSIS — M6281 Muscle weakness (generalized): Secondary | ICD-10-CM | POA: Diagnosis not present

## 2019-12-23 DIAGNOSIS — M545 Low back pain, unspecified: Secondary | ICD-10-CM | POA: Diagnosis not present

## 2019-12-30 DIAGNOSIS — R262 Difficulty in walking, not elsewhere classified: Secondary | ICD-10-CM | POA: Diagnosis not present

## 2019-12-30 DIAGNOSIS — M545 Low back pain, unspecified: Secondary | ICD-10-CM | POA: Diagnosis not present

## 2019-12-30 DIAGNOSIS — M6281 Muscle weakness (generalized): Secondary | ICD-10-CM | POA: Diagnosis not present

## 2019-12-30 DIAGNOSIS — Z96649 Presence of unspecified artificial hip joint: Secondary | ICD-10-CM | POA: Diagnosis not present

## 2020-01-08 ENCOUNTER — Telehealth: Payer: Self-pay

## 2020-01-08 ENCOUNTER — Other Ambulatory Visit: Payer: Self-pay | Admitting: Family Medicine

## 2020-01-08 DIAGNOSIS — I10 Essential (primary) hypertension: Secondary | ICD-10-CM

## 2020-01-08 NOTE — Telephone Encounter (Signed)
Patient called in stating that she is having a flair up and is in need of a injection wanting to know if she can have a visit. Let her know that we didn't have any opening in the office today. Talked with the nurse and she let me know that she would talk with nurse supervisor and see what we could do or offer the patient     Please call and advise

## 2020-01-08 NOTE — Telephone Encounter (Signed)
FYI Spoke with pt state that she is having a Fibromyalgia Flare up. Pt was advised that Dr Salomon Fick has no opening on her schedule this afternoon. Pt also notified that there was no opening with any Dr at the office and was also offered option of Saturday clinic but declined since the clinic is in far from her at [facility], Pt was offered to schedule appointment for Monday but declined and stated that she will [facility] over [facility].

## 2020-01-19 DIAGNOSIS — F411 Generalized anxiety disorder: Secondary | ICD-10-CM | POA: Diagnosis not present

## 2020-01-19 DIAGNOSIS — F333 Major depressive disorder, recurrent, severe with psychotic symptoms: Secondary | ICD-10-CM | POA: Diagnosis not present

## 2020-01-19 DIAGNOSIS — F605 Obsessive-compulsive personality disorder: Secondary | ICD-10-CM | POA: Diagnosis not present

## 2020-01-27 ENCOUNTER — Encounter: Payer: Self-pay | Admitting: Family Medicine

## 2020-01-27 ENCOUNTER — Ambulatory Visit (INDEPENDENT_AMBULATORY_CARE_PROVIDER_SITE_OTHER): Payer: BC Managed Care – PPO | Admitting: Family Medicine

## 2020-01-27 ENCOUNTER — Other Ambulatory Visit: Payer: Self-pay

## 2020-01-27 VITALS — BP 130/84 | HR 55 | Temp 98.3°F | Wt 176.6 lb

## 2020-01-27 DIAGNOSIS — R400 Somnolence: Secondary | ICD-10-CM | POA: Diagnosis not present

## 2020-01-27 DIAGNOSIS — R5383 Other fatigue: Secondary | ICD-10-CM | POA: Diagnosis not present

## 2020-01-27 DIAGNOSIS — I10 Essential (primary) hypertension: Secondary | ICD-10-CM

## 2020-01-27 DIAGNOSIS — G4733 Obstructive sleep apnea (adult) (pediatric): Secondary | ICD-10-CM | POA: Diagnosis not present

## 2020-01-27 DIAGNOSIS — F5101 Primary insomnia: Secondary | ICD-10-CM

## 2020-01-27 DIAGNOSIS — E039 Hypothyroidism, unspecified: Secondary | ICD-10-CM

## 2020-01-27 DIAGNOSIS — Z789 Other specified health status: Secondary | ICD-10-CM

## 2020-01-27 NOTE — Patient Instructions (Signed)
Living With Sleep Apnea Sleep apnea is a condition in which breathing pauses or becomes shallow during sleep. Sleep apnea is most commonly caused by a collapsed or blocked airway. People with sleep apnea snore loudly and have times when they gasp and stop breathing for 10 seconds or more during sleep. This happens over and over during the night. This disrupts your sleep and keeps your body from getting the rest that it needs, which can cause tiredness and lack of energy (fatigue) during the day. The breaks in breathing also interrupt the deep sleep that you need to feel rested. Even if you do not completely wake up from the gaps in breathing, your sleep may not be restful. You may also have a headache in the morning and low energy during the day, and you may feel anxious or depressed. How can sleep apnea affect me? Sleep apnea increases your chances of extreme tiredness during the day (daytime fatigue). It can also increase your risk for health conditions, such as:  Heart attack.  Stroke.  Diabetes.  Heart failure.  Irregular heartbeat.  High blood pressure. If you have daytime fatigue as a result of sleep apnea, you may be more likely to:  Perform poorly at school or work.  Fall asleep while driving.  Have difficulty with attention.  Develop depression or anxiety.  Become severely overweight (obese).  Have sexual dysfunction. What actions can I take to manage sleep apnea? Sleep apnea treatment   If you were given a device to open your airway while you sleep, use it only as told by your health care provider. You may be given: ? An oral appliance. This is a custom-made mouthpiece that shifts your lower jaw forward. ? A continuous positive airway pressure (CPAP) device. This device blows air through a mask when you breathe out (exhale). ? A nasal expiratory positive airway pressure (EPAP) device. This device has valves that you put into each nostril. ? A bi-level positive airway  pressure (BPAP) device. This device blows air through a mask when you breathe in (inhale) and breathe out (exhale).  You may need surgery if other treatments do not work for you. Sleep habits  Go to sleep and wake up at the same time every day. This helps set your internal clock (circadian rhythm) for sleeping. ? If you stay up later than usual, such as on weekends, try to get up in the morning within 2 hours of your normal wake time.  Try to get at least 7-9 hours of sleep each night.  Stop computer, tablet, and mobile phone use a few hours before bedtime.  Do not take long naps during the day. If you nap, limit it to 30 minutes.  Have a relaxing bedtime routine. Reading or listening to music may relax you and help you sleep.  Use your bedroom only for sleep. ? Keep your television and computer out of your bedroom. ? Keep your bedroom cool, dark, and quiet. ? Use a supportive mattress and pillows.  Follow your health care provider's instructions for other changes to sleep habits. Nutrition  Do not eat heavy meals in the evening.  Do not have caffeine in the later part of the day. The effects of caffeine can last for more than 5 hours.  Follow your health care provider's or dietitian's instructions for any diet changes. Lifestyle      Do not drink alcohol before bedtime. Alcohol can cause you to fall asleep at first, but then it can cause   you to wake up in the middle of the night and have trouble getting back to sleep.  Do not use any products that contain nicotine or tobacco, such as cigarettes and e-cigarettes. If you need help quitting, ask your health care provider. Medicines  Take over-the-counter and prescription medicines only as told by your health care provider.  Do not use over-the-counter sleep medicine. You can become dependent on this medicine, and it can make sleep apnea worse.  Do not use medicines, such as sedatives and narcotics, unless told by your health  care provider. Activity  Exercise on most days, but avoid exercising in the evening. Exercising near bedtime can interfere with sleeping.  If possible, spend time outside every day. Natural light helps regulate your circadian rhythm. General information  Lose weight if you need to, and maintain a healthy weight.  Keep all follow-up visits as told by your health care provider. This is important.  If you are having surgery, make sure to tell your health care provider that you have sleep apnea. You may need to bring your device with you. Where to find more information Learn more about sleep apnea and daytime fatigue from:  American Sleep Association: sleepassociation.org  National Sleep Foundation: sleepfoundation.org  National Heart, Lung, and Blood Institute: [employer] Summary  Sleep apnea can cause daytime fatigue and other serious health conditions.  Both sleep apnea and daytime fatigue can be bad for your health and well-being.  You may need to wear a device while sleeping to help keep your airway open.  If you are having surgery, make sure to tell your health care provider that you have sleep apnea. You may need to bring your device with you.  Making changes to sleep habits, diet, lifestyle, and activity can help you manage sleep apnea. This information is not intended to replace advice given to you by your health care provider. Make sure you discuss any questions you have with your health care provider. Document Revised: 06/20/2018 Document Reviewed: 05/23/2017 Elsevier Patient Education  2020 [employer] Inc[employer]  Insomnia Insomnia is a sleep disorder that makes it difficult to fall asleep or stay asleep. Insomnia can cause fatigue, low energy, difficulty concentrating, mood swings, and poor performance at work or school. There are three different ways to classify insomnia:  Difficulty falling asleep.  Difficulty staying asleep.  Waking up too early in the morning. Any  type of insomnia can be long-term (chronic) or short-term (acute). Both are common. Short-term insomnia usually lasts for three months or less. Chronic insomnia occurs at least three times a week for longer than three months. What are the causes? Insomnia may be caused by another condition, situation, or substance, such as:  Anxiety.  Certain medicines.  Gastroesophageal reflux disease (GERD) or other gastrointestinal conditions.  Asthma or other breathing conditions.  Restless legs syndrome, sleep apnea, or other sleep disorders.  Chronic pain.  Menopause.  Stroke.  Abuse of alcohol, tobacco, or illegal drugs.  Mental health conditions, such as depression.  Caffeine.  Neurological disorders, such as Alzheimer's disease.  An overactive thyroid (hyperthyroidism). Sometimes, the cause of insomnia may not be known. What increases the risk? Risk factors for insomnia include:  Gender. Women are affected more often than men.  Age. Insomnia is more common as you get older.  Stress.  Lack of exercise.  Irregular work schedule or working night shifts.  Traveling between different time zones.  Certain medical and mental health conditions. What are the signs or symptoms? If you  have insomnia, the main symptom is having trouble falling asleep or having trouble staying asleep. This may lead to other symptoms, such as:  Feeling fatigued or having low energy.  Feeling nervous about going to sleep.  Not feeling rested in the morning.  Having trouble concentrating.  Feeling irritable, anxious, or depressed. How is this diagnosed? This condition may be diagnosed based on:  Your symptoms and medical history. Your health care provider may ask about: ? Your sleep habits. ? Any medical conditions you have. ? Your mental health.  A physical exam. How is this treated? Treatment for insomnia depends on the cause. Treatment may focus on treating an underlying condition that  is causing insomnia. Treatment may also include:  Medicines to help you sleep.  Counseling or therapy.  Lifestyle adjustments to help you sleep better. Follow these instructions at home: Eating and drinking   Limit or avoid alcohol, caffeinated beverages, and cigarettes, especially close to bedtime. These can disrupt your sleep.  Do not eat a large meal or eat spicy foods right before bedtime. This can lead to digestive discomfort that can make it hard for you to sleep. Sleep habits   Keep a sleep diary to help you and your health care provider figure out what could be causing your insomnia. Write down: ? When you sleep. ? When you wake up during the night. ? How well you sleep. ? How rested you feel the next day. ? Any side effects of medicines you are taking. ? What you eat and drink.  Make your bedroom a dark, comfortable place where it is easy to fall asleep. ? Put up shades or blackout curtains to block light from outside. ? Use a white noise machine to block noise. ? Keep the temperature cool.  Limit screen use before bedtime. This includes: ? Watching TV. ? Using your smartphone, tablet, or computer.  Stick to a routine that includes going to bed and waking up at the same times every day and night. This can help you fall asleep faster. Consider making a quiet activity, such as reading, part of your nighttime routine.  Try to avoid taking naps during the day so that you sleep better at night.  Get out of bed if you are still awake after 15 minutes of trying to sleep. Keep the lights down, but try reading or doing a quiet activity. When you feel sleepy, go back to bed. General instructions  Take over-the-counter and prescription medicines only as told by your health care provider.  Exercise regularly, as told by your health care provider. Avoid exercise starting several hours before bedtime.  Use relaxation techniques to manage stress. Ask your health care provider  to suggest some techniques that may work well for you. These may include: ? Breathing exercises. ? Routines to release muscle tension. ? Visualizing peaceful scenes.  Make sure that you drive carefully. Avoid driving if you feel very sleepy.  Keep all follow-up visits as told by your health care provider. This is important. Contact a health care provider if:  You are tired throughout the day.  You have trouble in your daily routine due to sleepiness.  You continue to have sleep problems, or your sleep problems get worse. Get help right away if:  You have serious thoughts about hurting yourself or someone else. If you ever feel like you may hurt yourself or others, or have thoughts about taking your own life, get help right away. You can go to your nearest  emergency department or call:  Your local emergency services (911 in the [place]  A suicide crisis helpline, such as the National Suicide Prevention Lifeline at 301-294-1626. This is open 24 hours a day. Summary  Insomnia is a sleep disorder that makes it difficult to fall asleep or stay asleep.  Insomnia can be long-term (chronic) or short-term (acute).  Treatment for insomnia depends on the cause. Treatment may focus on treating an underlying condition that is causing insomnia.  Keep a sleep diary to help you and your health care provider figure out what could be causing your insomnia. This information is not intended to replace advice given to you by your health care provider. Make sure you discuss any questions you have with your health care provider. Document Revised: 02/08/2017 Document Reviewed: 12/06/2016 Elsevier Patient Education  2020 [employer].
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
## 2020-01-28 DIAGNOSIS — R5383 Other fatigue: Secondary | ICD-10-CM | POA: Diagnosis not present

## 2020-01-28 DIAGNOSIS — Z789 Other specified health status: Secondary | ICD-10-CM | POA: Diagnosis not present

## 2020-01-28 DIAGNOSIS — E039 Hypothyroidism, unspecified: Secondary | ICD-10-CM | POA: Diagnosis not present

## 2020-01-29 LAB — IRON,TIBC AND FERRITIN PANEL
Ferritin: 30 ng/mL (ref 15–150)
Iron Saturation: 14 % — ABNORMAL LOW (ref 15–55)
Iron: 44 ug/dL (ref 27–159)
Total Iron Binding Capacity: 310 ug/dL (ref 250–450)
UIBC: 266 ug/dL (ref 131–425)

## 2020-01-29 LAB — VITAMIN B12: Vitamin B-12: 1674 pg/mL — ABNORMAL HIGH (ref 232–1245)

## 2020-01-29 LAB — TSH: TSH: 1.56 u[IU]/mL (ref 0.450–4.500)

## 2020-01-29 LAB — COMPREHENSIVE METABOLIC PANEL
ALT: 26 IU/L (ref 0–32)
AST: 31 IU/L (ref 0–40)
Albumin/Globulin Ratio: 1.7 (ref 1.2–2.2)
Albumin: 4.3 g/dL (ref 3.8–4.9)
Alkaline Phosphatase: 114 IU/L (ref 44–121)
BUN/Creatinine Ratio: 11 (ref 9–23)
BUN: 10 mg/dL (ref 6–24)
Bilirubin Total: 0.4 mg/dL (ref 0.0–1.2)
CO2: 28 mmol/L (ref 20–29)
Calcium: 9.9 mg/dL (ref 8.7–10.2)
Chloride: 101 mmol/L (ref 96–106)
Creatinine, Ser: 0.94 mg/dL (ref 0.57–1.00)
GFR calc Af Amer: 81 mL/min/{1.73_m2} (ref >59)
GFR calc non Af Amer: 70 mL/min/{1.73_m2} (ref >59)
Globulin, Total: 2.5 g/dL (ref 1.5–4.5)
Glucose: 104 mg/dL — ABNORMAL HIGH (ref 65–99)
Potassium: 5 mmol/L (ref 3.5–5.2)
Sodium: 140 mmol/L (ref 134–144)
Total Protein: 6.8 g/dL (ref 6.0–8.5)

## 2020-01-29 LAB — CBC WITH DIFFERENTIAL/PLATELET
Basophils Absolute: 0 10*3/uL (ref 0.0–0.2)
Basos: 0 %
EOS (ABSOLUTE): 0.1 10*3/uL (ref 0.0–0.4)
Eos: 1 %
Hematocrit: 36.9 % (ref 34.0–46.6)
Hemoglobin: 12.6 g/dL (ref 11.1–15.9)
Immature Grans (Abs): 0 10*3/uL (ref 0.0–0.1)
Immature Granulocytes: 0 %
Lymphocytes Absolute: 4 10*3/uL — ABNORMAL HIGH (ref 0.7–3.1)
Lymphs: 55 %
MCH: 29 pg (ref 26.6–33.0)
MCHC: 34.1 g/dL (ref 31.5–35.7)
MCV: 85 fL (ref 79–97)
Monocytes Absolute: 0.5 10*3/uL (ref 0.1–0.9)
Monocytes: 6 %
Neutrophils Absolute: 2.8 10*3/uL (ref 1.4–7.0)
Neutrophils: 38 %
Platelets: 196 10*3/uL (ref 150–450)
RBC: 4.35 x10E6/uL (ref 3.77–5.28)
RDW: 15.1 % (ref 11.7–15.4)
WBC: 7.3 10*3/uL (ref 3.4–10.8)

## 2020-01-29 LAB — VITAMIN D 25 HYDROXY (VIT D DEFICIENCY, FRACTURES): Vit D, 25-Hydroxy: 60.1 ng/mL (ref 30.0–100.0)

## 2020-01-29 LAB — T4, FREE: Free T4: 1.13 ng/dL (ref 0.82–1.77)

## 2020-02-08 DIAGNOSIS — M47816 Spondylosis without myelopathy or radiculopathy, lumbar region: Secondary | ICD-10-CM | POA: Diagnosis not present

## 2020-02-10 DIAGNOSIS — Z96641 Presence of right artificial hip joint: Secondary | ICD-10-CM | POA: Diagnosis not present

## 2020-03-03 ENCOUNTER — Other Ambulatory Visit: Payer: Self-pay | Admitting: Family Medicine

## 2020-03-03 DIAGNOSIS — M797 Fibromyalgia: Secondary | ICD-10-CM

## 2020-04-06 DIAGNOSIS — F605 Obsessive-compulsive personality disorder: Secondary | ICD-10-CM | POA: Diagnosis not present

## 2020-04-06 DIAGNOSIS — F411 Generalized anxiety disorder: Secondary | ICD-10-CM | POA: Diagnosis not present

## 2020-04-06 DIAGNOSIS — F331 Major depressive disorder, recurrent, moderate: Secondary | ICD-10-CM | POA: Diagnosis not present

## 2020-04-21 DIAGNOSIS — Z471 Aftercare following joint replacement surgery: Secondary | ICD-10-CM | POA: Diagnosis not present

## 2020-04-21 DIAGNOSIS — Z96643 Presence of artificial hip joint, bilateral: Secondary | ICD-10-CM | POA: Diagnosis not present

## 2020-04-21 DIAGNOSIS — Z96641 Presence of right artificial hip joint: Secondary | ICD-10-CM | POA: Diagnosis not present

## 2020-04-21 DIAGNOSIS — Z4789 Encounter for other orthopedic aftercare: Secondary | ICD-10-CM | POA: Diagnosis not present

## 2020-04-21 DIAGNOSIS — Z96642 Presence of left artificial hip joint: Secondary | ICD-10-CM | POA: Diagnosis not present

## 2020-05-06 ENCOUNTER — Telehealth: Payer: Self-pay | Admitting: Family Medicine

## 2020-05-06 NOTE — Telephone Encounter (Signed)
Patient dropped off FMLA forms  Fax forms to: [phone] ATTN: Renee Till  Contact patient to pick up forms after completed and faxed for her to have a copy for her records.  Disposition: Dr's Folder

## 2020-05-06 NOTE — Telephone Encounter (Signed)
Pt form was received and given to Dr Salomon Fick for completing, Pt will notified when ready

## 2020-05-11 DIAGNOSIS — Z0279 Encounter for issue of other medical certificate: Secondary | ICD-10-CM

## 2020-05-18 NOTE — Telephone Encounter (Signed)
Will forward this to Dr Salomon Fick to see if they have been done.

## 2020-05-18 NOTE — Telephone Encounter (Signed)
Patient is calling back to check the status of her FMLA paperwork, please advise. CB is [phone]

## 2020-05-25 ENCOUNTER — Telehealth: Payer: Self-pay

## 2020-05-25 NOTE — Telephone Encounter (Signed)
Spoke with patient, informed her that the completed paperwork is at from desk.

## 2020-05-27 NOTE — Telephone Encounter (Signed)
Forms were completed.

## 2020-06-06 ENCOUNTER — Encounter: Payer: Self-pay | Admitting: Family Medicine

## 2020-06-06 ENCOUNTER — Other Ambulatory Visit: Payer: Self-pay

## 2020-06-06 ENCOUNTER — Ambulatory Visit (INDEPENDENT_AMBULATORY_CARE_PROVIDER_SITE_OTHER): Payer: BC Managed Care – PPO | Admitting: Family Medicine

## 2020-06-06 VITALS — BP 118/76 | HR 69 | Temp 98.1°F | Wt 172.6 lb

## 2020-06-06 DIAGNOSIS — S93492A Sprain of other ligament of left ankle, initial encounter: Secondary | ICD-10-CM

## 2020-06-06 DIAGNOSIS — W108XXA Fall (on) (from) other stairs and steps, initial encounter: Secondary | ICD-10-CM | POA: Diagnosis not present

## 2020-06-06 DIAGNOSIS — M25572 Pain in left ankle and joints of left foot: Secondary | ICD-10-CM | POA: Diagnosis not present

## 2020-06-06 DIAGNOSIS — S93412A Sprain of calcaneofibular ligament of left ankle, initial encounter: Secondary | ICD-10-CM

## 2020-06-06 NOTE — Patient Instructions (Addendum)
Ankle Sprain  An ankle sprain is a stretch or tear in a ligament in the ankle. Ligaments are tissues that connect bones to each other. The two most common types of ankle sprains are:  Inversion sprain. This happens when the foot turns inward and the ankle rolls outward. It affects the ligament on the outside of the foot (lateral ligament).  Eversion sprain. This happens when the foot turns outward and the ankle rolls inward. It affects the ligament on the inner side of the foot (medial ligament). What are the causes? This condition is often caused by accidentally rolling or twisting the ankle. What increases the risk? You are more likely to develop this condition if you play sports. What are the signs or symptoms? Symptoms of this condition include:  Pain in your ankle.  Swelling.  Bruising. This may develop right after you sprain your ankle or 1-2 days later.  Trouble standing or walking, especially when you turn or change directions.   How is this diagnosed? This condition is diagnosed with:  A physical exam. During the exam, your health care provider will press on certain parts of your foot and ankle and try to move them in certain ways.  X-ray imaging. These may be taken to see how severe the sprain is and to check for broken bones. How is this treated? This condition may be treated with:  A brace or splint. This is used to keep the ankle from moving until it heals.  An elastic bandage. This is used to support the ankle.  Crutches.  Pain medicine.  Surgery. This may be needed if the sprain is severe.  Physical therapy. This may help to improve the range of motion in the ankle. Follow these instructions at home: If you have a brace or a splint:  Wear the brace or splint as told by your health care provider. Remove it only as told by your health care provider.  Loosen the brace or splint if your toes tingle, become numb, or turn cold and blue.  Keep the brace or  splint clean.  If the brace or splint is not waterproof: ? Do not let it get wet. ? Cover it with a watertight covering when you take a bath or a shower. If you have an elastic bandage (dressing):  Remove it to shower or bathe.  Try not to move your ankle much, but wiggle your toes from time to time. This helps to prevent swelling.  Adjust the dressing to make it more comfortable if it feels too tight.  Loosen the dressing if you have numbness or tingling in your foot, or if your foot becomes cold and blue. Managing pain, stiffness, and swelling  Take over-the-counter and prescription medicines only as told by your health care provider.  For 2-3 days, keep your ankle raised (elevated) above the level of your heart as much as possible.  If directed, put ice on the injured area: ? If you have a removable brace or splint, remove it as told by your health care provider. ? Put ice in a plastic bag. ? Place a towel between your skin and the bag. ? Leave the ice on for 20 minutes, 2-3 times a day.   General instructions  Rest your ankle.  Do not use the injured limb to support your body weight until your health care provider says that you can. Use crutches as told by your health care provider.  Do not use any products that contain nicotine or  tobacco, such as cigarettes, e-cigarettes, and chewing tobacco. If you need help quitting, ask your health care provider.  Keep all follow-up visits as told by your health care provider. This is important. Contact a health care provider if:  You have rapidly increasing bruising or swelling.  Your pain is not relieved with medicine. Get help right away if:  Your foot or toes become numb or blue.  You have severe pain that gets worse. Summary  An ankle sprain is a stretch or tear in a ligament in the ankle. Ligaments are tissues that connect bones to each other.  This condition is often caused by accidentally rolling or twisting the  ankle.  Symptoms include pain, swelling, bruising, and trouble walking.  To relieve pain and swelling, put ice on the affected ankle, raise your ankle above the level of your heart, and use an elastic bandage.  Keep all follow-up visits as told by your health care provider. This is important. This information is not intended to replace advice given to you by your health care provider. Make sure you discuss any questions you have with your health care provider. Document Revised: 11/18/2017 Document Reviewed: 07/23/2017 Elsevier Patient Education  2021 [employer]  Ankle Sprain, Phase I Rehab An ankle sprain is an injury to the ligaments of your ankle. Ankle sprains cause stiffness, loss of motion, and loss of strength. Ask your health care provider which exercises are safe for you. Do exercises exactly as told by your health care provider and adjust them as directed. It is normal to feel mild stretching, pulling, tightness, or discomfort as you do these exercises. Stop right away if you feel sudden pain or your pain gets worse. Do not begin these exercises until told by your health care provider. Stretching and range-of-motion exercises These exercises warm up your muscles and joints and improve the movement and flexibility of your lower leg and ankle. These exercises also help to relieve pain and stiffness. Gastroc and soleus stretch This exercise is also called a calf stretch. It stretches the muscles in the back of the lower leg. These muscles are the gastrocnemius, or gastroc, and the soleus. 1. Sit on the floor with your left / right leg extended. 2. Loop a belt or towel around the ball of your left / right foot. The ball of your foot is on the walking surface, right under your toes. 3. Keep your left / right ankle and foot relaxed and keep your knee straight while you use the belt or towel to pull your foot toward you. You should feel a gentle stretch behind your calf or knee in your  gastroc muscle. 4. Hold this position for __________ seconds, then release to the starting position. 5. Repeat the exercise with your knee bent. You can put a pillow or a rolled bath towel under your knee to support it. You should feel a stretch deep in your calf in the soleus muscle or at your Achilles tendon. Repeat __________ times. Complete this exercise __________ times a day.   Ankle alphabet 1. Sit with your left / right leg supported at the lower leg. ? Do not rest your foot on anything. ? Make sure your foot has room to move freely. 2. Think of your left / right foot as a paintbrush. ? Move your foot to trace each letter of the alphabet in the air. Keep your hip and knee still while you trace. ? Make the letters as large as you can without feeling  discomfort. 3. Trace every letter from A to Z. Repeat __________ times. Complete this exercise __________ times a day.   Strengthening exercises These exercises build strength and endurance in your ankle and lower leg. Endurance is the ability to use your muscles for a long time, even after they get tired. Ankle dorsiflexion 1. Secure a rubber exercise band or tube to an object, such as a table leg, that will stay still when the band is pulled. Secure the other end around your left / right foot. 2. Sit on the floor facing the object, with your left / right leg extended. The band or tube should be slightly tense when your foot is relaxed. 3. Slowly bring your foot toward you, bringing the top of your foot toward your shin (dorsiflexion), and pulling the band tighter. 4. Hold this position for __________ seconds. 5. Slowly return your foot to the starting position. Repeat __________ times. Complete this exercise __________ times a day.   Ankle plantar flexion 1. Sit on the floor with your left / right leg extended. 2. Loop a rubber exercise tube or band around the ball of your left / right foot. The ball of your foot is on the walking  surface, right under your toes. ? Hold the ends of the band or tube in your hands. ? The band or tube should be slightly tense when your foot is relaxed. 3. Slowly point your foot and toes downward to tilt the top of your foot away from your shin (plantar flexion). 4. Hold this position for __________ seconds. 5. Slowly return your foot to the starting position. Repeat __________ times. Complete this exercise __________ times a day.   Ankle eversion 1. Sit on the floor with your legs straight out in front of you. 2. Loop a rubber exercise band or tube around the ball of your left / right foot. The ball of your foot is on the walking surface, right under your toes. ? Hold the ends of the band in your hands, or secure the band to a stable object. ? The band or tube should be slightly tense when your foot is relaxed. 3. Slowly push your foot outward, away from your other leg (eversion). 4. Hold this position for __________ seconds. 5. Slowly return your foot to the starting position. Repeat __________ times. Complete this exercise __________ times a day. This information is not intended to replace advice given to you by your health care provider. Make sure you discuss any questions you have with your health care provider. Document Revised: 06/17/2018 Document Reviewed: 12/09/2017 Elsevier Patient Education  2021 [employer].

## 2020-06-06 NOTE — Progress Notes (Signed)
Subjective:    Patient ID: Abigail Gomez, female    DOB: [birth year], 55 y.o.   MRN: [id number]  Chief Complaint  Patient presents with  . Ankle Pain    HPI Patient is a 55 year old female with past medical history significant for HTN, migraines, OSA, GERD, hypothyroidism, OA s/p R THR, HLD sickle cell trait, fibromyalgia, Lyme disease?,  RLS, spinal stenosis, vitamin D deficiency was seen today for acute concern.  Pt with L ankle pain s/p fall over the wknd down stone stairs when trying to leave her sister's home.  Patient notes immediate pain and swelling in left ankle.  Patient continued through the weekend taking care of errands and family business before returning back to [place].  Patient states she has tried elevation and ice but notes continued left lateral ankle discomfort.  Was able to bear some weight.  Using cane at baseline.  Past Medical History:  Diagnosis Date  . Allergy   . Anemia   . Anxiety   . Depression   . Disease    lymes disease  . Fibromyalgia   . H/o Lyme disease   . H/O sickle cell trait   . History of pelvic fracture   . History of urinary incontinence   . Hypertension   . Hypothyroidism   . Insomnia   . Labral tear of hip joint    Right Hip  . Migraine   . Osteoarthritis   . RLS (restless legs syndrome)   . Sleep apnea    cpap  . Spinal stenosis   . Thyroid disease   . TMJ syndrome   . Vitamin D deficiency     Allergies  Allergen Reactions  . Peanut Oil Anaphylaxis  . Penicillins Other (See Comments) and Anaphylaxis    Syncope Has patient had a PCN reaction causing immediate rash, facial/tongue/throat swelling, SOB or lightheadedness with hypotension:Yes Has patient had a PCN reaction causing severe rash involving mucus membranes or skin necrosis:No Has patient had a PCN reaction that required hospitalization:No Has patient had a PCN reaction occurring within the last 10 years:No If all of the above answers are "NO", then may proceed  with Cephalosporin use.   . Food     Nuts-itching/swelling Bananas-itching/swelling  . Lactose Intolerance (Gi) Other (See Comments)    G.I. upset  . Lactulose Other (See Comments)    G.I. upset  . Lisinopril Cough  . Other Itching    Nuts-itching/swelling Bananas-itching/swelling  . Oxycodone-Acetaminophen Itching and Other (See Comments)    ROS General: Denies fever, chills, night sweats, changes in weight, changes in appetite HEENT: Denies headaches, ear pain, changes in vision, rhinorrhea, sore throat CV: Denies CP, palpitations, SOB, orthopnea Pulm: Denies SOB, cough, wheezing GI: Denies abdominal pain, nausea, vomiting, diarrhea, constipation GU: Denies dysuria, hematuria, frequency, vaginal discharge Msk: Denies muscle cramps, joint pains  + left ankle pain Neuro: Denies weakness, numbness, tingling Skin: Denies rashes, bruising Psych: Denies depression, anxiety, hallucinations     Objective:    Blood pressure 118/76, pulse 69, temperature 98.1 F (36.7 C), temperature source Oral, weight 172 lb 9.6 oz (78.3 kg), SpO2 99 %.  Gen. Pleasant, well-nourished, in no distress, normal affect   HEENT: [place]/AT, face symmetric, conjunctiva clear, no scleral icterus, PERRLA, EOMI, nares patent without drainage Lungs: no accessory muscle use, CTAB, no wheezes or rales Cardiovascular: RRR, no m/r/g, no peripheral edema.  Left DP and PT pulses intact Abdomen: BS present, soft, NT/ND, no hepatosplenomegaly. Musculoskeletal: L with mild edema.  TTP of left anterior talofibular and calcaneofibular ligaments.  No pain with dorsiflexion plantar flexion inversion and eversion of left ankle/foot.  No ankle instability noted.  No deformities, no cyanosis or clubbing, normal tone Neuro:  A&Ox3, CN II-XII intact, ambulating with cane Skin:  Warm, no lesions/ rash   Wt Readings from Last 3 Encounters:  06/06/20 172 lb 9.6 oz (78.3 kg)  01/27/20 176 lb 9.6 oz (80.1 kg)  05/26/19 199 lb 6.4  oz (90.4 kg)    Lab Results  Component Value Date   WBC 7.3 01/28/2020   HGB 12.6 01/28/2020   HCT 36.9 01/28/2020   PLT 196 01/28/2020   GLUCOSE 104 (H) 01/28/2020   CHOL 174 11/10/2018   TRIG 103.0 11/10/2018   HDL 52.10 11/10/2018   LDLCALC 101 (H) 11/10/2018   ALT 26 01/28/2020   AST 31 01/28/2020   NA 140 01/28/2020   K 5.0 01/28/2020   CL 101 01/28/2020   CREATININE 0.94 01/28/2020   BUN 10 01/28/2020   CO2 28 01/28/2020   TSH 1.560 01/28/2020   HGBA1C 5.1 08/23/2014    Assessment/Plan:  Acute left ankle pain -Left ankle wrapped with Ace bandage by this provider -Discussed supportive care including ice, heat, NSAIDs, stretching, rest, elevation, and compression -Given handouts -For continued or worsening symptoms consider imaging  Sprain of anterior talofibular ligament of left ankle, initial encounter  Sprain of calcaneofibular ligament of left ankle, initial encounter  Fall (on) (from) other stairs and steps, initial encounter -Given precautions  F/u as needed in the next few weeks  Abbe Amsterdam, MD

## 2020-06-20 ENCOUNTER — Encounter: Payer: Self-pay | Admitting: Family Medicine

## 2020-06-20 ENCOUNTER — Ambulatory Visit (INDEPENDENT_AMBULATORY_CARE_PROVIDER_SITE_OTHER): Payer: BC Managed Care – PPO | Admitting: Family Medicine

## 2020-06-20 ENCOUNTER — Other Ambulatory Visit: Payer: Self-pay

## 2020-06-20 VITALS — BP 144/88 | HR 62 | Temp 98.2°F

## 2020-06-20 DIAGNOSIS — S93412D Sprain of calcaneofibular ligament of left ankle, subsequent encounter: Secondary | ICD-10-CM | POA: Diagnosis not present

## 2020-06-20 DIAGNOSIS — F439 Reaction to severe stress, unspecified: Secondary | ICD-10-CM

## 2020-06-20 DIAGNOSIS — S93492D Sprain of other ligament of left ankle, subsequent encounter: Secondary | ICD-10-CM

## 2020-06-20 NOTE — Progress Notes (Signed)
Subjective:    Patient ID: Abigail Gomez, female    DOB: [birth year], 55 y.o.   MRN: [id number]  No chief complaint on file.   HPI Patient was seen today for f/u on L ankle sprain.  Initially seen 06/06/2020.  Pt notes improvement in edema.  Still having some soreness and inability to flex left ankle.  Started using a boot but now wearing an Ace bandage for support.  Pt notes continued stress caring for her family members who live out of town, working, and dealing with her own health issues.  Patient was previously in counseling but has yet to find a new provider.  Endorses feeling down over the weekend/staying in bed, and decreased appetite.  Patient notes anxiety about COVID-19, making it difficult for her to go out around other people.  Past Medical History:  Diagnosis Date  . Allergy   . Anemia   . Anxiety   . Depression   . Disease    lymes disease  . Fibromyalgia   . H/o Lyme disease   . H/O sickle cell trait   . History of pelvic fracture   . History of urinary incontinence   . Hypertension   . Hypothyroidism   . Insomnia   . Labral tear of hip joint    Right Hip  . Migraine   . Osteoarthritis   . RLS (restless legs syndrome)   . Sleep apnea    cpap  . Spinal stenosis   . Thyroid disease   . TMJ syndrome   . Vitamin D deficiency     Allergies  Allergen Reactions  . Peanut Oil Anaphylaxis  . Penicillins Other (See Comments) and Anaphylaxis    Syncope Has patient had a PCN reaction causing immediate rash, facial/tongue/throat swelling, SOB or lightheadedness with hypotension:Yes Has patient had a PCN reaction causing severe rash involving mucus membranes or skin necrosis:No Has patient had a PCN reaction that required hospitalization:No Has patient had a PCN reaction occurring within the last 10 years:No If all of the above answers are "NO", then may proceed with Cephalosporin use.   . Food     Nuts-itching/swelling Bananas-itching/swelling  . Lactose  Intolerance (Gi) Other (See Comments)    G.I. upset  . Lactulose Other (See Comments)    G.I. upset  . Lisinopril Cough  . Other Itching    Nuts-itching/swelling Bananas-itching/swelling  . Oxycodone-Acetaminophen Itching and Other (See Comments)    ROS General: Denies fever, chills, night sweats, changes in weight, changes in appetite HEENT: Denies headaches, ear pain, changes in vision, rhinorrhea, sore throat CV: Denies CP, palpitations, SOB, orthopnea Pulm: Denies SOB, cough, wheezing GI: Denies abdominal pain, nausea, vomiting, diarrhea, constipation GU: Denies dysuria, hematuria, frequency, vaginal discharge Msk: Denies muscle cramps, joint pains  +L foot/ankle soreness Neuro: Denies weakness, numbness, tingling Skin: Denies rashes, bruising Psych: Denies hallucinations  +anxiety, depression    Objective:    Blood pressure (!) 144/88, pulse 62, temperature 98.2 F (36.8 C), temperature source Oral, SpO2 99 %.  Gen. Pleasant, well-nourished, in no distress, normal affect  HEENT: [place]/AT, face symmetric, conjunctiva clear, no scleral icterus, PERRLA, EOMI, nares patent without drainage Lungs: no accessory muscle use Cardiovascular: RRR, no peripheral edema Musculoskeletal: L foot/ankle with FROM.  A 1 cm round area of edema of lateral L foot with TTP.   No deformities, no cyanosis or clubbing, normal tone Neuro:  A&Ox3, CN II-XII intact, normal gait without use of any assistive device this visit. Skin:  Warm, no lesions/ rash   Wt Readings from Last 3 Encounters:  06/06/20 172 lb 9.6 oz (78.3 kg)  01/27/20 176 lb 9.6 oz (80.1 kg)  05/26/19 199 lb 6.4 oz (90.4 kg)    Lab Results  Component Value Date   WBC 7.3 01/28/2020   HGB 12.6 01/28/2020   HCT 36.9 01/28/2020   PLT 196 01/28/2020   GLUCOSE 104 (H) 01/28/2020   CHOL 174 11/10/2018   TRIG 103.0 11/10/2018   HDL 52.10 11/10/2018   LDLCALC 101 (H) 11/10/2018   ALT 26 01/28/2020   AST 31 01/28/2020   NA 140  01/28/2020   K 5.0 01/28/2020   CL 101 01/28/2020   CREATININE 0.94 01/28/2020   BUN 10 01/28/2020   CO2 28 01/28/2020   TSH 1.560 01/28/2020   HGBA1C 5.1 08/23/2014    Assessment/Plan:  Sprain of anterior talofibular ligament of left ankle, subsequent encounter  Sprain of calcaneofibular ligament of left ankle, subsequent encounter -Improving -may have chipped a piece of bone based on degree of sprain -Continue supportive care -Discussed stretching/ROM exercises -For continued or worsening symptoms consider PT and imaging  Stress -Multifactorial -Discussed the importance of self-care -Continue to monitor for increased anxiety/depression symptoms -Given information about area [facility] providers for counseling. -Continue to monitor  F/u as needed  Abbe Amsterdam, MD

## 2020-07-06 DIAGNOSIS — F331 Major depressive disorder, recurrent, moderate: Secondary | ICD-10-CM | POA: Diagnosis not present

## 2020-07-06 DIAGNOSIS — F411 Generalized anxiety disorder: Secondary | ICD-10-CM | POA: Diagnosis not present

## 2020-07-06 DIAGNOSIS — F605 Obsessive-compulsive personality disorder: Secondary | ICD-10-CM | POA: Diagnosis not present

## 2020-08-10 DIAGNOSIS — F332 Major depressive disorder, recurrent severe without psychotic features: Secondary | ICD-10-CM | POA: Diagnosis not present

## 2020-08-19 ENCOUNTER — Encounter: Payer: Self-pay | Admitting: Family Medicine

## 2020-08-19 ENCOUNTER — Ambulatory Visit (INDEPENDENT_AMBULATORY_CARE_PROVIDER_SITE_OTHER): Payer: BC Managed Care – PPO | Admitting: Family Medicine

## 2020-08-19 ENCOUNTER — Other Ambulatory Visit: Payer: Self-pay

## 2020-08-19 VITALS — BP 120/70 | HR 50 | Temp 98.1°F | Wt 172.0 lb

## 2020-08-19 DIAGNOSIS — R1013 Epigastric pain: Secondary | ICD-10-CM | POA: Diagnosis not present

## 2020-08-19 MED ORDER — FAMOTIDINE 20 MG PO TABS: 20.0000 mg | ORAL_TABLET | Freq: Two times a day (BID) | ORAL | 0 refills | Status: DC

## 2020-08-19 MED ORDER — DICYCLOMINE HCL 20 MG PO TABS: 20.0000 mg | ORAL_TABLET | Freq: Four times a day (QID) | ORAL | 0 refills | Status: DC | PRN

## 2020-08-19 NOTE — Progress Notes (Signed)
   Subjective:    Patient ID: Abigail Gomez, female    DOB: 14-May-1965, 55 y.o.   MRN: [id number]  HPI Here for recurrent upper abdominal pains that started 25 years ago. About once or twice a year she will get these pains which come and go over a 1-2 week period. These pains started again yesterday. They are intense sharp pains that seem to squeeze her upper abdomen and then let go. No fever or back pain. No nausea. Her BMs are normal. Eating food seems to aggravate the pains. She seldom has any heartburn. No urinary symptoms. She is under a lot of stress with 3 family members having health problems.    Review of Systems  Constitutional: Negative.   Respiratory: Negative.    Cardiovascular: Negative.   Gastrointestinal:  Positive for abdominal pain. Negative for abdominal distention, anal bleeding, blood in stool, constipation, diarrhea, nausea, rectal pain and vomiting.  Genitourinary: Negative.       Objective:   Physical Exam Constitutional:      Appearance: Normal appearance. She is not ill-appearing.  Cardiovascular:     Rate and Rhythm: Normal rate and regular rhythm.     Pulses: Normal pulses.     Heart sounds: Normal heart sounds.  Pulmonary:     Effort: Pulmonary effort is normal.     Breath sounds: Normal breath sounds.  Abdominal:     General: Abdomen is flat. Bowel sounds are normal. There is no distension.     Palpations: Abdomen is soft. There is no mass.     Tenderness: There is no abdominal tenderness. There is no guarding or rebound.     Hernia: No hernia is present.  Neurological:     Mental Status: She is alert.          Assessment & Plan:  Epigastric pain that could present duodenitis/gastritis or it could be from biliary colic. We will set up an abdominal US for next week. She will start on Pepcid 20 mg BID, and she can use Dicyclomine as needed.  Gershon Crane, MD

## 2020-08-21 ENCOUNTER — Other Ambulatory Visit: Payer: Self-pay | Admitting: Family Medicine

## 2020-08-21 DIAGNOSIS — I10 Essential (primary) hypertension: Secondary | ICD-10-CM

## 2020-08-22 ENCOUNTER — Ambulatory Visit: Payer: BC Managed Care – PPO | Admitting: Family Medicine

## 2020-08-23 DIAGNOSIS — R194 Change in bowel habit: Secondary | ICD-10-CM | POA: Diagnosis not present

## 2020-08-23 DIAGNOSIS — R1011 Right upper quadrant pain: Secondary | ICD-10-CM | POA: Diagnosis not present

## 2020-08-23 DIAGNOSIS — R1033 Periumbilical pain: Secondary | ICD-10-CM | POA: Diagnosis not present

## 2020-08-23 DIAGNOSIS — R112 Nausea with vomiting, unspecified: Secondary | ICD-10-CM | POA: Diagnosis not present

## 2020-08-23 LAB — CBC AND DIFFERENTIAL
HCT: 38 (ref 36–46)
Hemoglobin: 12.9 (ref 12.0–16.0)
Platelets: 220 (ref 150–399)
WBC: 7

## 2020-08-23 LAB — CBC: RBC: 4.41 (ref 3.87–5.11)

## 2020-08-23 LAB — BASIC METABOLIC PANEL
BUN: 11 (ref 4–21)
CO2: 24 — AB (ref 13–22)
Chloride: 97 — AB (ref 99–108)
Creatinine: 1.1 (ref 0.5–1.1)
Glucose: 81
Potassium: 4 (ref 3.4–5.3)
Sodium: 137 (ref 137–147)

## 2020-08-23 LAB — COMPREHENSIVE METABOLIC PANEL: Calcium: 9.9 (ref 8.7–10.7)

## 2020-08-23 LAB — HEPATIC FUNCTION PANEL
ALT: 24 (ref 7–35)
AST: 25 (ref 13–35)
Alkaline Phosphatase: 140 — AB (ref 25–125)
Bilirubin, Total: 0.4

## 2020-08-23 LAB — TSH: TSH: 0.48 (ref <=5.90)

## 2020-08-24 ENCOUNTER — Other Ambulatory Visit: Payer: Self-pay

## 2020-08-24 DIAGNOSIS — F332 Major depressive disorder, recurrent severe without psychotic features: Secondary | ICD-10-CM | POA: Diagnosis not present

## 2020-08-25 ENCOUNTER — Other Ambulatory Visit (HOSPITAL_COMMUNITY): Payer: Self-pay | Admitting: Family Medicine

## 2020-08-25 ENCOUNTER — Ambulatory Visit (INDEPENDENT_AMBULATORY_CARE_PROVIDER_SITE_OTHER): Payer: BC Managed Care – PPO | Admitting: Family Medicine

## 2020-08-25 VITALS — BP 112/68 | HR 55 | Temp 98.3°F | Wt 171.4 lb

## 2020-08-25 DIAGNOSIS — R1013 Epigastric pain: Secondary | ICD-10-CM | POA: Diagnosis not present

## 2020-08-25 DIAGNOSIS — R899 Unspecified abnormal finding in specimens from other organs, systems and tissues: Secondary | ICD-10-CM

## 2020-08-25 NOTE — Progress Notes (Signed)
Subjective:    Patient ID: Abigail Gomez, female    DOB: 12-30-1965, 55 y.o.   MRN: [id number]  Chief Complaint  Patient presents with   Follow-up    gastro    HPI Patient was seen today for f/u on chronic concern.  Patient seen in office on 08/19/2020 for epigastric pain noted as piercing, aching.  Started on Pepcid 20 mg twice daily and dicyclomine as needed.  Pt seen by GI, Dr. Loreta Ave yesterday 6/16.  Pt advised LFTs elevated.  Ultrasound scheduled.  Patient also advised to follow-up with PCP as creatinine elevated at 1.05.    Past Medical History:  Diagnosis Date   Allergy    Anemia    Anxiety    Depression    Disease    lymes disease   Fibromyalgia    H/o Lyme disease    H/O sickle cell trait    History of pelvic fracture    History of urinary incontinence    Hypertension    Hypothyroidism    Insomnia    Labral tear of hip joint    Right Hip   Migraine    Osteoarthritis    RLS (restless legs syndrome)    Sleep apnea    cpap   Spinal stenosis    Thyroid disease    TMJ syndrome    Vitamin D deficiency     Allergies  Allergen Reactions   Peanut Oil Anaphylaxis   Penicillins Other (See Comments) and Anaphylaxis    Syncope Has patient had a PCN reaction causing immediate rash, facial/tongue/throat swelling, SOB or lightheadedness with hypotension:Yes Has patient had a PCN reaction causing severe rash involving mucus membranes or skin necrosis:No Has patient had a PCN reaction that required hospitalization:No Has patient had a PCN reaction occurring within the last 10 years:No If all of the above answers are "NO", then may proceed with Cephalosporin use.    Food     Nuts-itching/swelling Bananas-itching/swelling   Lactose Intolerance (Gi) Other (See Comments)    G.I. upset   Lactulose Other (See Comments)    G.I. upset   Lisinopril Cough   Other Itching    Nuts-itching/swelling Bananas-itching/swelling   Oxycodone-Acetaminophen Itching and Other (See  Comments)    ROS General: Denies fever, chills, night sweats, changes in weight, changes in appetite HEENT: Denies headaches, ear pain, changes in vision, rhinorrhea, sore throat CV: Denies CP, palpitations, SOB, orthopnea Pulm: Denies SOB, cough, wheezing GI: Denies nausea, vomiting, diarrhea, constipation  +abdominal pain GU: Denies dysuria, hematuria, frequency, vaginal discharge Msk: Denies muscle cramps, joint pains Neuro: Denies weakness, numbness, tingling Skin: Denies rashes, bruising Psych: Denies depression, anxiety, hallucinations    Objective:    Blood pressure 112/68, pulse (!) 55, temperature 98.3 F (36.8 C), temperature source Oral, weight 171 lb 6.4 oz (77.7 kg), SpO2 99 %.  Gen. Pleasant, well-nourished, in no distress, normal affect   HEENT: [place]/AT, face symmetric, conjunctiva clear, no scleral icterus, PERRLA, EOMI, nares patent without drainage Lungs: no accessory muscle use Cardiovascular: RRR, no peripheral edema Musculoskeletal: No deformities, no cyanosis or clubbing, normal tone Neuro:  A&Ox3, CN II-XII intact Skin:  Warm, no lesions/ rash  Wt Readings from Last 3 Encounters:  08/25/20 171 lb 6.4 oz (77.7 kg)  08/19/20 172 lb (78 kg)  06/06/20 172 lb 9.6 oz (78.3 kg)    Lab Results  Component Value Date   WBC 7.3 01/28/2020   HGB 12.6 01/28/2020   HCT 36.9 01/28/2020   PLT  196 01/28/2020   GLUCOSE 104 (H) 01/28/2020   CHOL 174 11/10/2018   TRIG 103.0 11/10/2018   HDL 52.10 11/10/2018   LDLCALC 101 (H) 11/10/2018   ALT 26 01/28/2020   AST 31 01/28/2020   NA 140 01/28/2020   K 5.0 01/28/2020   CL 101 01/28/2020   CREATININE 0.94 01/28/2020   BUN 10 01/28/2020   CO2 28 01/28/2020   TSH 1.560 01/28/2020   HGBA1C 5.1 08/23/2014    Assessment/Plan:  Abnormal laboratory test result -Per pt LFTs and creat elevated at GI appt -advised to keep hepatic u/s appt. -per chart review creatinine previously normal.  Reported elevation of 1.05  likely 2/2 decreased po intake given ongoing abd pain.  Baseline creatinine .90  -continue to monitor -keep f/u with GI  Epigastric pain -RUQ u/s scheduled -increase po intake as tolerated -continue f/u with GI  F/u prn  Abbe Amsterdam, MD

## 2020-08-26 ENCOUNTER — Other Ambulatory Visit (HOSPITAL_COMMUNITY): Payer: Self-pay | Admitting: Family Medicine

## 2020-08-26 ENCOUNTER — Other Ambulatory Visit (HOSPITAL_COMMUNITY): Payer: Self-pay | Admitting: Gastroenterology

## 2020-08-26 ENCOUNTER — Other Ambulatory Visit: Payer: Self-pay | Admitting: Gastroenterology

## 2020-08-26 DIAGNOSIS — R1013 Epigastric pain: Secondary | ICD-10-CM

## 2020-08-29 ENCOUNTER — Ambulatory Visit (HOSPITAL_COMMUNITY): Payer: BC Managed Care – PPO

## 2020-08-30 ENCOUNTER — Encounter: Payer: Self-pay | Admitting: Family Medicine

## 2020-08-30 ENCOUNTER — Telehealth: Payer: Self-pay | Admitting: Family Medicine

## 2020-08-30 NOTE — Telephone Encounter (Signed)
Patient is out of town for a death in the family and she has a really bad headache and wants to know if Dr. Salomon Fick can [facility] in [facility] the town [facility] in.    rizatriptan (MAXALT-MLT) 10 MG disintegrating tablet   [id number] - [place], [place] - [address] AT [address] Phone:  [phone]  Fax:  [phone]

## 2020-08-31 ENCOUNTER — Other Ambulatory Visit: Payer: Self-pay | Admitting: Family Medicine

## 2020-08-31 DIAGNOSIS — G43809 Other migraine, not intractable, without status migrainosus: Secondary | ICD-10-CM

## 2020-08-31 MED ORDER — RIZATRIPTAN BENZOATE 10 MG PO TBDP: 10.0000 mg | ORAL_TABLET | Freq: Every day | ORAL | 5 refills | Status: DC | PRN

## 2020-09-05 ENCOUNTER — Encounter (HOSPITAL_BASED_OUTPATIENT_CLINIC_OR_DEPARTMENT_OTHER): Payer: Self-pay

## 2020-09-05 ENCOUNTER — Ambulatory Visit (HOSPITAL_BASED_OUTPATIENT_CLINIC_OR_DEPARTMENT_OTHER): Payer: BC Managed Care – PPO

## 2020-09-06 ENCOUNTER — Other Ambulatory Visit: Payer: BC Managed Care – PPO

## 2020-09-08 DIAGNOSIS — F332 Major depressive disorder, recurrent severe without psychotic features: Secondary | ICD-10-CM | POA: Diagnosis not present

## 2020-09-10 ENCOUNTER — Encounter: Payer: Self-pay | Admitting: Family Medicine

## 2020-09-10 ENCOUNTER — Other Ambulatory Visit: Payer: Self-pay | Admitting: Family Medicine

## 2020-09-13 NOTE — Telephone Encounter (Signed)
Dr.Banks pt 

## 2020-09-17 ENCOUNTER — Other Ambulatory Visit: Payer: Self-pay | Admitting: Family Medicine

## 2020-09-28 ENCOUNTER — Other Ambulatory Visit: Payer: Self-pay | Admitting: Family Medicine

## 2020-09-28 DIAGNOSIS — F332 Major depressive disorder, recurrent severe without psychotic features: Secondary | ICD-10-CM | POA: Diagnosis not present

## 2020-09-29 ENCOUNTER — Ambulatory Visit (HOSPITAL_COMMUNITY): Payer: BC Managed Care – PPO

## 2020-09-29 ENCOUNTER — Encounter (HOSPITAL_COMMUNITY): Payer: Self-pay

## 2020-09-29 ENCOUNTER — Ambulatory Visit (HOSPITAL_COMMUNITY): Admission: RE | Admit: 2020-09-29 | Payer: BC Managed Care – PPO | Source: Ambulatory Visit

## 2020-09-29 ENCOUNTER — Inpatient Hospital Stay (HOSPITAL_COMMUNITY): Admission: RE | Admit: 2020-09-29 | Payer: BC Managed Care – PPO | Source: Ambulatory Visit

## 2020-10-01 ENCOUNTER — Other Ambulatory Visit: Payer: Self-pay | Admitting: Family Medicine

## 2020-10-05 DIAGNOSIS — F411 Generalized anxiety disorder: Secondary | ICD-10-CM | POA: Diagnosis not present

## 2020-10-05 DIAGNOSIS — F331 Major depressive disorder, recurrent, moderate: Secondary | ICD-10-CM | POA: Diagnosis not present

## 2020-10-05 DIAGNOSIS — F605 Obsessive-compulsive personality disorder: Secondary | ICD-10-CM | POA: Diagnosis not present

## 2020-10-07 ENCOUNTER — Other Ambulatory Visit: Payer: Self-pay | Admitting: Family Medicine

## 2020-10-07 DIAGNOSIS — M797 Fibromyalgia: Secondary | ICD-10-CM

## 2020-10-12 DIAGNOSIS — F332 Major depressive disorder, recurrent severe without psychotic features: Secondary | ICD-10-CM | POA: Diagnosis not present

## 2020-10-18 ENCOUNTER — Other Ambulatory Visit: Payer: Self-pay | Admitting: Family Medicine

## 2020-10-31 ENCOUNTER — Encounter (HOSPITAL_COMMUNITY): Admission: RE | Admit: 2020-10-31 | Payer: BC Managed Care – PPO | Source: Ambulatory Visit

## 2020-10-31 ENCOUNTER — Encounter (HOSPITAL_COMMUNITY): Payer: Self-pay

## 2020-10-31 ENCOUNTER — Telehealth: Payer: Self-pay

## 2020-10-31 ENCOUNTER — Ambulatory Visit (HOSPITAL_COMMUNITY)
Admission: RE | Admit: 2020-10-31 | Discharge: 2020-10-31 | Disposition: A | Discharge status: Home / Self Care | Payer: BC Managed Care – PPO | Source: Ambulatory Visit | Attending: Family Medicine | Admitting: Family Medicine

## 2020-10-31 ENCOUNTER — Other Ambulatory Visit: Payer: Self-pay

## 2020-10-31 DIAGNOSIS — R1013 Epigastric pain: Secondary | ICD-10-CM

## 2020-10-31 NOTE — Telephone Encounter (Signed)
Patient called  requesting a referral to have ultrasound with an [facility] office.  Patient would like a call back.

## 2020-11-02 DIAGNOSIS — F332 Major depressive disorder, recurrent severe without psychotic features: Secondary | ICD-10-CM | POA: Diagnosis not present

## 2020-11-02 NOTE — Telephone Encounter (Signed)
Message routed to Dr.Fry

## 2020-11-03 ENCOUNTER — Other Ambulatory Visit: Payer: Self-pay | Admitting: Family Medicine

## 2020-11-03 NOTE — Telephone Encounter (Signed)
I changed the order so she can go to a [facility] facility

## 2020-11-03 NOTE — Addendum Note (Signed)
Addended by: Gershon Crane A on: 11/03/2020 10:11 AM   Modules accepted: Orders

## 2020-11-03 NOTE — Telephone Encounter (Signed)
Spoke with patient, informed of message. 

## 2020-11-17 ENCOUNTER — Other Ambulatory Visit: Payer: Self-pay

## 2020-11-17 ENCOUNTER — Ambulatory Visit
Admission: RE | Admit: 2020-11-17 | Discharge: 2020-11-17 | Disposition: A | Discharge status: Home / Self Care | Payer: BC Managed Care – PPO | Source: Ambulatory Visit | Attending: Family Medicine | Admitting: Family Medicine

## 2020-11-17 DIAGNOSIS — R1013 Epigastric pain: Secondary | ICD-10-CM | POA: Diagnosis not present

## 2020-11-23 DIAGNOSIS — F332 Major depressive disorder, recurrent severe without psychotic features: Secondary | ICD-10-CM | POA: Diagnosis not present

## 2020-12-01 ENCOUNTER — Other Ambulatory Visit: Payer: Self-pay | Admitting: Family Medicine

## 2020-12-01 DIAGNOSIS — I10 Essential (primary) hypertension: Secondary | ICD-10-CM

## 2020-12-06 DIAGNOSIS — F332 Major depressive disorder, recurrent severe without psychotic features: Secondary | ICD-10-CM | POA: Diagnosis not present

## 2020-12-13 ENCOUNTER — Other Ambulatory Visit: Payer: Self-pay

## 2020-12-13 ENCOUNTER — Ambulatory Visit (INDEPENDENT_AMBULATORY_CARE_PROVIDER_SITE_OTHER): Payer: BC Managed Care – PPO | Admitting: Family Medicine

## 2020-12-13 VITALS — BP 120/64 | HR 70 | Temp 98.6°F | Wt 171.2 lb

## 2020-12-13 DIAGNOSIS — M545 Low back pain, unspecified: Secondary | ICD-10-CM | POA: Diagnosis not present

## 2020-12-13 MED ORDER — PREDNISONE 20 MG PO TABS: ORAL_TABLET | ORAL | 0 refills | Status: DC

## 2020-12-13 NOTE — Progress Notes (Signed)
Established Patient Office Visit  Subjective:  Patient ID: Abigail Gomez, female    DOB: 1965-04-18  Age: 55 y.o. MRN: [id number]  CC:  Chief Complaint  Patient presents with   Back Pain    Chronic back pain, used to get injections, has had multipule back surgeries. Was doing her stretches yesterday and heard something on the left side of the back pop.     HPI Abigail Gomez presents for acute on chronic lower back pain.  She states she had surgery for Chiari malformation back in 2007 with had some chronic back difficulties since then.  She does frequent yoga and stretches and yesterday was getting up and felt a "popping sensation" left lower lumbar area.  No radiculitis symptoms.  She had some leftover Flexeril which she took without relief.  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  She specifically would like to try some prednisone which has helped with similar flareups in the past.  She also has reported fibromyalgia.  Denies any lower extremity numbness or weakness.  No urine or stool incontinence.  Pain worse with movement.   Past Medical History:  Diagnosis Date   Allergy    Anemia    Anxiety    Depression    Disease    lymes disease   Fibromyalgia    H/o Lyme disease    H/O sickle cell trait    History of pelvic fracture    History of urinary incontinence    Hypertension    Hypothyroidism    Insomnia    Labral tear of hip joint    Right Hip   Migraine    Osteoarthritis    RLS (restless legs syndrome)    Sleep apnea    cpap   Spinal stenosis    Thyroid disease    TMJ syndrome    Vitamin D deficiency     Past Surgical History:  Procedure Laterality Date   ABDOMINAL HYSTERECTOMY     BUNIONECTOMY Bilateral    chiari malformation     ENDOMETRIAL ABLATION     MYOMECTOMY     OOPHORECTOMY     TONSILLECTOMY AND ADENOIDECTOMY     TOTAL HIP ARTHROPLASTY Right 02/28/2016   TOTAL HIP ARTHROPLASTY Right 02/28/2016    Procedure: RIGHT TOTAL HIP ARTHROPLASTY ANTERIOR APPROACH;  Surgeon: Kathryne Hitch, MD;  Location: [facility] OR;  Service: Orthopedics;  Laterality: Right;    Family History  Problem Relation Age of Onset   Hyperlipidemia Mother    Diabetes Father    Hypertension Father    Cancer Father        prostate   Diabetes Sister    Hypertension Sister    Arthritis Maternal Grandmother    Stroke Maternal Grandmother    Hypertension Maternal Grandmother    Hyperlipidemia Maternal Grandmother    Hyperkalemia Maternal Grandmother    Heart disease Maternal Grandmother    Arthritis Paternal Grandmother    Diabetes Sister     Social History   Socioeconomic History   Marital status: Single    Spouse name: Not on file   Number of children: Not on file   Years of education: Not on file   Highest education level: Not on file  Occupational History   Not on file  Tobacco Use   Smoking status: Never   Smokeless tobacco: Never  Vaping Use   Vaping Use: Never used  Substance and Sexual Activity  Alcohol use: Yes    Alcohol/week: 0.0 standard drinks    Comment: occ   Drug use: No   Sexual activity: Not on file  Other Topics Concern   Not on file  Social History Narrative   Not on file   Social Determinants of Health   Financial Resource Strain: Not on file  Food Insecurity: Not on file  Transportation Needs: Not on file  Physical Activity: Not on file  Stress: Not on file  Social Connections: Not on file  Intimate Partner Violence: Not on file    Outpatient Medications Prior to Visit  Medication Sig Dispense Refill   ALPRAZolam (XANAX) 0.5 MG tablet Take 0.5 mg by mouth at bedtime as needed for anxiety.     amitriptyline (ELAVIL) 10 MG tablet TAKE 4 TABLETS (40 MG TOTAL) BY MOUTH AT BEDTIME. 336 tablet 29   ARMOUR THYROID 30 MG tablet TAKE 1 TABLET BY MOUTH EVERY DAY 90 tablet 1   cholecalciferol (VITAMIN D) 1000 units tablet Take 5,000 Units by mouth daily.      dicyclomine  (BENTYL) 20 MG tablet TAKE 1 TABLET (20 MG TOTAL) BY MOUTH EVERY 6 (SIX) HOURS AS NEEDED FOR SPASMS. 360 tablet 3   estradiol (CLIMARA - DOSED IN MG/24 HR) 0.1 mg/24hr patch Place 0.1 mg onto the skin 2 (two) times a week.     famotidine (PEPCID) 20 MG tablet TAKE 1 TABLET BY MOUTH TWICE A DAY 180 tablet 0   fluticasone (FLONASE) 50 MCG/ACT nasal spray Place 1 spray into both nostrils daily. 16 g 6   lidocaine (LIDODERM) 5 % PLACE 1 PATCH ONTO THE SKIN DAILY. REMOVE & DISCARD PATCH WITHIN 12 HOURS OR AS DIRECTED BY MD 90 patch 3   losartan-hydrochlorothiazide (HYZAAR) 100-25 MG tablet TAKE 1/2 TABLET(50MG /12.5MG ) BY MOUTH EVERY DAY 45 tablet 1   Lurasidone HCl (LATUDA PO) Take 30 mg by mouth daily.     Magnesium Amino Acid Chelate 20 % POWD Take 450 mg by mouth at bedtime.     Melatonin 10 MG TABS Take 10 mg by mouth at bedtime.      metoprolol tartrate (LOPRESSOR) 50 MG tablet TAKE 1 TABLET BY MOUTH TWICE A DAY 180 tablet 1   rizatriptan (MAXALT-MLT) 10 MG disintegrating tablet Take 1 tablet (10 mg total) by mouth daily as needed for migraine. 10 tablet 5   tiZANidine (ZANAFLEX) 4 MG capsule TAKE 1 CAPSULE (4 MG TOTAL) BY MOUTH 2 (TWO) TIMES DAILY AS NEEDED FOR MUSCLE SPASMS 180 capsule 1   VITAMIN B COMPLEX-C PO Take 1 capsule by mouth daily.     vortioxetine HBr (TRINTELLIX) 20 MG TABS tablet Take 20 mg by mouth daily.      zolpidem (AMBIEN CR) 6.25 MG CR tablet Take 6.25 mg by mouth at bedtime as needed for sleep.     zonisamide (ZONEGRAN) 50 MG capsule Take 150 mg by mouth at bedtime.     losartan-hydrochlorothiazide (HYZAAR) 50-12.5 MG tablet Take 1 tablet by mouth daily. 90 tablet 3   No facility-administered medications prior to visit.    Allergies  Allergen Reactions   Peanut Oil Anaphylaxis   Penicillins Other (See Comments) and Anaphylaxis    Syncope Has patient had a PCN reaction causing immediate rash, facial/tongue/throat swelling, SOB or lightheadedness with  hypotension:Yes Has patient had a PCN reaction causing severe rash involving mucus membranes or skin necrosis:No Has patient had a PCN reaction that required hospitalization:No Has patient had a PCN reaction occurring within  the last 10 years:No If all of the above answers are "NO", then may proceed with Cephalosporin use.    Food     Nuts-itching/swelling Bananas-itching/swelling   Lactose Intolerance (Gi) Other (See Comments)    G.I. upset   Lactulose Other (See Comments)    G.I. upset   Lisinopril Cough   Other Itching    Nuts-itching/swelling Bananas-itching/swelling   Oxycodone-Acetaminophen Itching and Other (See Comments)    ROS Review of Systems  Constitutional:  Negative for chills and fever.  Genitourinary:  Negative for dysuria.  Musculoskeletal:  Positive for back pain.  Neurological:  Negative for weakness and numbness.     Objective:    Physical Exam Vitals reviewed.  Constitutional:      Appearance: Normal appearance.  Cardiovascular:     Rate and Rhythm: Normal rate and regular rhythm.  Musculoskeletal:     Comments: No spinal tenderness.  Straight leg raise are negative bilaterally.  Neurological:     Mental Status: She is alert.     Comments: Full strength with plantarflexion and dorsiflexion.  She has 2+ reflexes ankle and knee bilaterally    BP 120/64 (BP Location: Left Arm, Patient Position: Sitting, Cuff Size: Normal)   Pulse 70   Temp 98.6 F (37 C) (Oral)   Wt 171 lb 3.2 oz (77.7 kg)   SpO2 99%   BMI 26.03 kg/m  Wt Readings from Last 3 Encounters:  12/13/20 171 lb 3.2 oz (77.7 kg)  08/25/20 171 lb 6.4 oz (77.7 kg)  08/19/20 172 lb (78 kg)     Health Maintenance Due  Topic Date Due   COVID-19 Vaccine (1) Never done   HIV Screening  Never done   Hepatitis C Screening  Never done   Zoster Vaccines- Shingrix (1 of 2) Never done   PAP SMEAR-Modifier  03/03/2017   MAMMOGRAM  09/22/2017   TETANUS/TDAP  02/26/2018   INFLUENZA VACCINE   10/10/2020    There are no preventive care reminders to display for this patient.  Lab Results  Component Value Date   TSH 0.48 08/23/2020   Lab Results  Component Value Date   WBC 7.0 08/23/2020   HGB 12.9 08/23/2020   HCT 38 08/23/2020   MCV 85 01/28/2020   PLT 220 08/23/2020   Lab Results  Component Value Date   NA 137 08/23/2020   K 4.0 08/23/2020   CO2 24 (A) 08/23/2020   GLUCOSE 104 (H) 01/28/2020   BUN 11 08/23/2020   CREATININE 1.1 08/23/2020   BILITOT 0.4 01/28/2020   ALKPHOS 140 (A) 08/23/2020   AST 25 08/23/2020   ALT 24 08/23/2020   PROT 6.8 01/28/2020   ALBUMIN 4.3 01/28/2020   CALCIUM 9.9 08/23/2020   ANIONGAP 6 02/29/2016   GFR 66.82 11/10/2018   Lab Results  Component Value Date   CHOL 174 11/10/2018   Lab Results  Component Value Date   HDL 52.10 11/10/2018   Lab Results  Component Value Date   LDLCALC 101 (H) 11/10/2018   Lab Results  Component Value Date   TRIG 103.0 11/10/2018   Lab Results  Component Value Date   CHOLHDL 3 11/10/2018   Lab Results  Component Value Date   HGBA1C 5.1 08/23/2014      Assessment & Plan:   Problem List Items Addressed This Visit   None Visit Diagnoses     Left-sided low back pain without sciatica, unspecified chronicity    -  Primary   Relevant Medications  predniSONE (DELTASONE) 20 MG tablet     Acute on chronic lumbar back pain left side.  Nonfocal neuro exam. -Continue Flexeril as needed -Agreed to trial of prednisone over the next week.  This has benefited her in the past for similar flareups.  Take 20 mg 2 tablets daily for 6 days. -Follow-up with primary or her neurosurgeon if not improving over the next couple weeks -Also suggest that she continue with some extension stretches  Meds ordered this encounter  Medications   predniSONE (DELTASONE) 20 MG tablet    Sig: Take two tablets by mouth once daily for 6 days.    Dispense:  12 tablet    Refill:  0    Follow-up: No  follow-ups on file.    Evelena Peat, MD

## 2020-12-14 DIAGNOSIS — Z01419 Encounter for gynecological examination (general) (routine) without abnormal findings: Secondary | ICD-10-CM | POA: Diagnosis not present

## 2020-12-14 DIAGNOSIS — Z6824 Body mass index (BMI) 24.0-24.9, adult: Secondary | ICD-10-CM | POA: Diagnosis not present

## 2020-12-14 DIAGNOSIS — Z113 Encounter for screening for infections with a predominantly sexual mode of transmission: Secondary | ICD-10-CM | POA: Diagnosis not present

## 2020-12-21 DIAGNOSIS — Z1231 Encounter for screening mammogram for malignant neoplasm of breast: Secondary | ICD-10-CM | POA: Diagnosis not present

## 2021-01-04 DIAGNOSIS — F332 Major depressive disorder, recurrent severe without psychotic features: Secondary | ICD-10-CM | POA: Diagnosis not present

## 2021-02-06 DIAGNOSIS — F411 Generalized anxiety disorder: Secondary | ICD-10-CM | POA: Diagnosis not present

## 2021-02-06 DIAGNOSIS — F332 Major depressive disorder, recurrent severe without psychotic features: Secondary | ICD-10-CM | POA: Diagnosis not present

## 2021-02-07 DIAGNOSIS — F411 Generalized anxiety disorder: Secondary | ICD-10-CM | POA: Diagnosis not present

## 2021-02-07 DIAGNOSIS — F331 Major depressive disorder, recurrent, moderate: Secondary | ICD-10-CM | POA: Diagnosis not present

## 2021-02-07 DIAGNOSIS — F605 Obsessive-compulsive personality disorder: Secondary | ICD-10-CM | POA: Diagnosis not present

## 2021-02-10 ENCOUNTER — Ambulatory Visit: Payer: BC Managed Care – PPO | Admitting: Family Medicine

## 2021-02-15 ENCOUNTER — Other Ambulatory Visit: Payer: Self-pay | Admitting: Family Medicine

## 2021-02-21 DIAGNOSIS — F332 Major depressive disorder, recurrent severe without psychotic features: Secondary | ICD-10-CM | POA: Diagnosis not present

## 2021-02-21 DIAGNOSIS — F605 Obsessive-compulsive personality disorder: Secondary | ICD-10-CM | POA: Diagnosis not present

## 2021-02-21 DIAGNOSIS — F331 Major depressive disorder, recurrent, moderate: Secondary | ICD-10-CM | POA: Diagnosis not present

## 2021-02-21 DIAGNOSIS — F411 Generalized anxiety disorder: Secondary | ICD-10-CM | POA: Diagnosis not present

## 2021-02-22 ENCOUNTER — Other Ambulatory Visit: Payer: Self-pay

## 2021-02-22 ENCOUNTER — Ambulatory Visit (INDEPENDENT_AMBULATORY_CARE_PROVIDER_SITE_OTHER): Payer: BC Managed Care – PPO

## 2021-02-22 ENCOUNTER — Ambulatory Visit (INDEPENDENT_AMBULATORY_CARE_PROVIDER_SITE_OTHER): Payer: BC Managed Care – PPO | Admitting: Family Medicine

## 2021-02-22 VITALS — BP 142/82 | HR 72 | Temp 99.2°F | Wt 164.8 lb

## 2021-02-22 DIAGNOSIS — M25572 Pain in left ankle and joints of left foot: Secondary | ICD-10-CM | POA: Diagnosis not present

## 2021-02-22 DIAGNOSIS — M48061 Spinal stenosis, lumbar region without neurogenic claudication: Secondary | ICD-10-CM | POA: Diagnosis not present

## 2021-02-22 DIAGNOSIS — S93412D Sprain of calcaneofibular ligament of left ankle, subsequent encounter: Secondary | ICD-10-CM

## 2021-02-22 DIAGNOSIS — G8929 Other chronic pain: Secondary | ICD-10-CM

## 2021-02-22 DIAGNOSIS — R600 Localized edema: Secondary | ICD-10-CM | POA: Diagnosis not present

## 2021-02-22 DIAGNOSIS — Z13820 Encounter for screening for osteoporosis: Secondary | ICD-10-CM | POA: Diagnosis not present

## 2021-02-22 DIAGNOSIS — Z9889 Other specified postprocedural states: Secondary | ICD-10-CM | POA: Diagnosis not present

## 2021-02-22 DIAGNOSIS — N959 Unspecified menopausal and perimenopausal disorder: Secondary | ICD-10-CM | POA: Diagnosis not present

## 2021-02-22 DIAGNOSIS — M5136 Other intervertebral disc degeneration, lumbar region: Secondary | ICD-10-CM | POA: Diagnosis not present

## 2021-02-22 NOTE — Patient Instructions (Signed)
You can use Aspercreme on your ankle a few times per day to help with the discomfort.

## 2021-02-22 NOTE — Progress Notes (Signed)
Subjective:    Patient ID: Abigail Gomez, female    DOB: Nov 29, 1965, 55 y.o.   MRN: [id number]  Chief Complaint  Patient presents with   Ankle Injury    Fallen up the stairs twice, problems rolling it, very painful. Happened before, declined xray but would like to get one now. Was off of it for a week, used ace bandage for about a month, still no improvement.    HPI Patient was seen today for ongoing concern.  Pt with L ankle pain. Endorses re-injuring ankle s/p fall x 4.  Has fallen while navigating stairs.  Tried rest, ACE bandage, but notes continued pain.      Endorses continued stress dealing with her own health issues as well as caring [place] her [place].    Past Medical History:  Diagnosis Date   Allergy    Anemia    Anxiety    Depression    Disease    lymes disease   Fibromyalgia    H/o Lyme disease    H/O sickle cell trait    History of pelvic fracture    History of urinary incontinence    Hypertension    Hypothyroidism    Insomnia    Labral tear of hip joint    Right Hip   Migraine    Osteoarthritis    RLS (restless legs syndrome)    Sleep apnea    cpap   Spinal stenosis    Thyroid disease    TMJ syndrome    Vitamin D deficiency     Allergies  Allergen Reactions   Peanut Oil Anaphylaxis   Penicillins Other (See Comments) and Anaphylaxis    Syncope Has patient had a PCN reaction causing immediate rash, facial/tongue/throat swelling, SOB or lightheadedness with hypotension:Yes Has patient had a PCN reaction causing severe rash involving mucus membranes or skin necrosis:No Has patient had a PCN reaction that required hospitalization:No Has patient had a PCN reaction occurring within the last 10 years:No If all of the above answers are "NO", then may proceed with Cephalosporin use.    Food     Nuts-itching/swelling Bananas-itching/swelling   Lactose Intolerance (Gi) Other (See Comments)    G.I. upset    Lactulose Other (See Comments)    G.I. upset   Lisinopril Cough   Other Itching    Nuts-itching/swelling Bananas-itching/swelling   Oxycodone-Acetaminophen Itching and Other (See Comments)    ROS General: Denies fever, chills, night sweats, changes in weight, changes in appetite +falls HEENT: Denies headaches, ear pain, changes in vision, rhinorrhea, sore throat CV: Denies CP, palpitations, SOB, orthopnea Pulm: Denies SOB, cough, wheezing GI: Denies abdominal pain, nausea, vomiting, diarrhea, constipation GU: Denies dysuria, hematuria, frequency, vaginal discharge Msk: Denies muscle cramps, joint pains  +L ankle pain. Neuro: Denies weakness, numbness, tingling Skin: Denies rashes, bruising Psych: Denies depression, anxiety, hallucinations  +stress     Objective:    Blood pressure (!) 142/82, pulse 72, temperature 99.2 F (37.3 C), temperature source Oral, weight 164 lb 12.8 oz (74.8 kg), SpO2 97 %.  Gen. Pleasant, well-nourished, in no distress, normal affect   HEENT: [place]/AT, face symmetric, conjunctiva clear, no scleral icterus, PERRLA, EOMI, nares patent without drainage Lungs: no accessory muscle use, CTAB, no wheezes or rales Cardiovascular: RRR, no m/r/g, no peripheral edema Musculoskeletal: No edema of b/l ankles.  No TTP of b/l ankles.  No ankle instability.  No deformities, no cyanosis or clubbing, normal  tone Neuro:  A&Ox3, CN II-XII intact, ambulating with a cane Skin:  Warm, no lesions/ rash  Wt Readings from Last 3 Encounters:  02/22/21 164 lb 12.8 oz (74.8 kg)  12/13/20 171 lb 3.2 oz (77.7 kg)  08/25/20 171 lb 6.4 oz (77.7 kg)    Lab Results  Component Value Date   WBC 7.0 08/23/2020   HGB 12.9 08/23/2020   HCT 38 08/23/2020   PLT 220 08/23/2020   GLUCOSE 104 (H) 01/28/2020   CHOL 174 11/10/2018   TRIG 103.0 11/10/2018   HDL 52.10 11/10/2018   LDLCALC 101 (H) 11/10/2018   ALT 24 08/23/2020   AST 25 08/23/2020   NA 137 08/23/2020   K 4.0 08/23/2020    CL 97 (A) 08/23/2020   CREATININE 1.1 08/23/2020   BUN 11 08/23/2020   CO2 24 (A) 08/23/2020   TSH 0.48 08/23/2020   HGBA1C 5.1 08/23/2014    Assessment/Plan:  Chronic pain of left ankle  -aspercme prn - Plan: DG Ankle Complete Left  Sprain of calcaneofibular ligament of left ankle, subsequent encounter  -severe sprain likely 2/2 re-injury x 4. -supportive care  -given continued symptoms discussed obtaining imaging. -further recs including possible referrals based on imaging. - Plan: DG Ankle Complete Left  F/u prn  Abbe Amsterdam, MD

## 2021-02-24 ENCOUNTER — Telehealth: Payer: Self-pay | Admitting: Family Medicine

## 2021-02-24 NOTE — Telephone Encounter (Signed)
Pt seen dr banks on 02-22-2021 and would like foot xray results

## 2021-02-24 NOTE — Telephone Encounter (Signed)
Spoke with pt, is aware of results. 

## 2021-03-07 ENCOUNTER — Encounter: Payer: Self-pay | Admitting: Family Medicine

## 2021-03-07 DIAGNOSIS — F411 Generalized anxiety disorder: Secondary | ICD-10-CM | POA: Diagnosis not present

## 2021-03-07 DIAGNOSIS — F332 Major depressive disorder, recurrent severe without psychotic features: Secondary | ICD-10-CM | POA: Diagnosis not present

## 2021-03-10 ENCOUNTER — Other Ambulatory Visit: Payer: Self-pay | Admitting: Family Medicine

## 2021-03-23 DIAGNOSIS — M79672 Pain in left foot: Secondary | ICD-10-CM | POA: Diagnosis not present

## 2021-03-23 DIAGNOSIS — M25572 Pain in left ankle and joints of left foot: Secondary | ICD-10-CM | POA: Diagnosis not present

## 2021-03-23 DIAGNOSIS — S93492A Sprain of other ligament of left ankle, initial encounter: Secondary | ICD-10-CM | POA: Diagnosis not present

## 2021-04-11 DIAGNOSIS — F411 Generalized anxiety disorder: Secondary | ICD-10-CM | POA: Diagnosis not present

## 2021-04-11 DIAGNOSIS — F605 Obsessive-compulsive personality disorder: Secondary | ICD-10-CM | POA: Diagnosis not present

## 2021-04-11 DIAGNOSIS — F331 Major depressive disorder, recurrent, moderate: Secondary | ICD-10-CM | POA: Diagnosis not present

## 2021-04-14 DIAGNOSIS — G8929 Other chronic pain: Secondary | ICD-10-CM | POA: Diagnosis not present

## 2021-04-14 DIAGNOSIS — R262 Difficulty in walking, not elsewhere classified: Secondary | ICD-10-CM | POA: Diagnosis not present

## 2021-04-14 DIAGNOSIS — M25572 Pain in left ankle and joints of left foot: Secondary | ICD-10-CM | POA: Diagnosis not present

## 2021-04-14 DIAGNOSIS — S93492A Sprain of other ligament of left ankle, initial encounter: Secondary | ICD-10-CM | POA: Diagnosis not present

## 2021-04-20 DIAGNOSIS — M533 Sacrococcygeal disorders, not elsewhere classified: Secondary | ICD-10-CM | POA: Diagnosis not present

## 2021-04-20 DIAGNOSIS — T8484XD Pain due to internal orthopedic prosthetic devices, implants and grafts, subsequent encounter: Secondary | ICD-10-CM | POA: Diagnosis not present

## 2021-04-20 DIAGNOSIS — Z96641 Presence of right artificial hip joint: Secondary | ICD-10-CM | POA: Diagnosis not present

## 2021-04-20 DIAGNOSIS — Z471 Aftercare following joint replacement surgery: Secondary | ICD-10-CM | POA: Diagnosis not present

## 2021-04-20 DIAGNOSIS — Z96643 Presence of artificial hip joint, bilateral: Secondary | ICD-10-CM | POA: Diagnosis not present

## 2021-05-03 ENCOUNTER — Telehealth: Payer: Self-pay | Admitting: Family Medicine

## 2021-05-03 NOTE — Telephone Encounter (Signed)
Form received, placed on providers desk.  

## 2021-05-03 NOTE — Telephone Encounter (Signed)
Extension of Leave form to be filled out, placed in dr's folder.  Fax paperwork to info on form and then call pt to pickup paperwork.

## 2021-05-05 DIAGNOSIS — F332 Major depressive disorder, recurrent severe without psychotic features: Secondary | ICD-10-CM | POA: Diagnosis not present

## 2021-05-05 DIAGNOSIS — F411 Generalized anxiety disorder: Secondary | ICD-10-CM | POA: Diagnosis not present

## 2021-05-15 NOTE — Telephone Encounter (Signed)
Forms completed

## 2021-05-15 NOTE — Telephone Encounter (Signed)
Pt is calling checking on the status of her FMLA paperwork ?

## 2021-05-16 NOTE — Telephone Encounter (Signed)
Forms faxed, fax confirmation rec'd. Patient is aware and will pick up from front office. ?

## 2021-05-22 DIAGNOSIS — X58XXXD Exposure to other specified factors, subsequent encounter: Secondary | ICD-10-CM | POA: Diagnosis not present

## 2021-05-22 DIAGNOSIS — M7071 Other bursitis of hip, right hip: Secondary | ICD-10-CM | POA: Diagnosis not present

## 2021-05-22 DIAGNOSIS — Y939 Activity, unspecified: Secondary | ICD-10-CM | POA: Diagnosis not present

## 2021-05-22 DIAGNOSIS — Z96649 Presence of unspecified artificial hip joint: Secondary | ICD-10-CM | POA: Diagnosis not present

## 2021-05-22 DIAGNOSIS — T8484XD Pain due to internal orthopedic prosthetic devices, implants and grafts, subsequent encounter: Secondary | ICD-10-CM | POA: Diagnosis not present

## 2021-05-22 DIAGNOSIS — Y828 Other medical devices associated with adverse incidents: Secondary | ICD-10-CM | POA: Diagnosis not present

## 2021-06-15 ENCOUNTER — Telehealth: Payer: Self-pay | Admitting: Family Medicine

## 2021-06-15 NOTE — Telephone Encounter (Signed)
Patient called in requesting a medication refill for rizatriptan (MAXALT-MLT) 10 MG disintegrating tablet [[id number]] to be sent to her pharmacy. ? ?[facility]acy: [id number] [place], [place] - [address]  ?[address] [place], [place] [address]  ? ?Please advise. ?

## 2021-06-19 ENCOUNTER — Other Ambulatory Visit: Payer: Self-pay | Admitting: Family Medicine

## 2021-06-19 DIAGNOSIS — G43809 Other migraine, not intractable, without status migrainosus: Secondary | ICD-10-CM

## 2021-06-19 MED ORDER — RIZATRIPTAN BENZOATE 10 MG PO TBDP: 10.0000 mg | ORAL_TABLET | Freq: Every day | ORAL | 5 refills | Status: DC | PRN

## 2021-06-19 NOTE — Telephone Encounter (Signed)
Refill sent to pharmacy.   

## 2021-06-20 DIAGNOSIS — Z96641 Presence of right artificial hip joint: Secondary | ICD-10-CM | POA: Diagnosis not present

## 2021-06-20 DIAGNOSIS — M71551 Other bursitis, not elsewhere classified, right hip: Secondary | ICD-10-CM | POA: Diagnosis not present

## 2021-06-20 DIAGNOSIS — S76321A Laceration of muscle, fascia and tendon of the posterior muscle group at thigh level, right thigh, initial encounter: Secondary | ICD-10-CM | POA: Diagnosis not present

## 2021-07-06 DIAGNOSIS — F605 Obsessive-compulsive personality disorder: Secondary | ICD-10-CM | POA: Diagnosis not present

## 2021-07-06 DIAGNOSIS — F3132 Bipolar disorder, current episode depressed, moderate: Secondary | ICD-10-CM | POA: Diagnosis not present

## 2021-07-06 DIAGNOSIS — F331 Major depressive disorder, recurrent, moderate: Secondary | ICD-10-CM | POA: Diagnosis not present

## 2021-07-06 DIAGNOSIS — F411 Generalized anxiety disorder: Secondary | ICD-10-CM | POA: Diagnosis not present

## 2021-07-10 DIAGNOSIS — T8484XD Pain due to internal orthopedic prosthetic devices, implants and grafts, subsequent encounter: Secondary | ICD-10-CM | POA: Diagnosis not present

## 2021-07-10 DIAGNOSIS — M25551 Pain in right hip: Secondary | ICD-10-CM | POA: Diagnosis not present

## 2021-07-10 DIAGNOSIS — Y792 Prosthetic and other implants, materials and accessory orthopedic devices associated with adverse incidents: Secondary | ICD-10-CM | POA: Diagnosis not present

## 2021-07-10 DIAGNOSIS — Z96641 Presence of right artificial hip joint: Secondary | ICD-10-CM | POA: Diagnosis not present

## 2021-08-21 DIAGNOSIS — H40023 Open angle with borderline findings, high risk, bilateral: Secondary | ICD-10-CM | POA: Diagnosis not present

## 2021-09-06 DIAGNOSIS — F331 Major depressive disorder, recurrent, moderate: Secondary | ICD-10-CM | POA: Diagnosis not present

## 2021-09-07 ENCOUNTER — Other Ambulatory Visit: Payer: Self-pay | Admitting: Family Medicine

## 2021-10-12 DIAGNOSIS — M4607 Spinal enthesopathy, lumbosacral region: Secondary | ICD-10-CM | POA: Diagnosis not present

## 2021-10-12 DIAGNOSIS — M62830 Muscle spasm of back: Secondary | ICD-10-CM | POA: Diagnosis not present

## 2021-10-12 DIAGNOSIS — M9902 Segmental and somatic dysfunction of thoracic region: Secondary | ICD-10-CM | POA: Diagnosis not present

## 2021-10-12 DIAGNOSIS — M9905 Segmental and somatic dysfunction of pelvic region: Secondary | ICD-10-CM | POA: Diagnosis not present

## 2021-10-17 DIAGNOSIS — M9905 Segmental and somatic dysfunction of pelvic region: Secondary | ICD-10-CM | POA: Diagnosis not present

## 2021-10-17 DIAGNOSIS — M9902 Segmental and somatic dysfunction of thoracic region: Secondary | ICD-10-CM | POA: Diagnosis not present

## 2021-10-17 DIAGNOSIS — M4607 Spinal enthesopathy, lumbosacral region: Secondary | ICD-10-CM | POA: Diagnosis not present

## 2021-10-17 DIAGNOSIS — M62830 Muscle spasm of back: Secondary | ICD-10-CM | POA: Diagnosis not present

## 2021-10-18 ENCOUNTER — Other Ambulatory Visit: Payer: Self-pay | Admitting: Family Medicine

## 2021-10-18 DIAGNOSIS — M797 Fibromyalgia: Secondary | ICD-10-CM

## 2021-10-30 DIAGNOSIS — R6882 Decreased libido: Secondary | ICD-10-CM | POA: Diagnosis not present

## 2021-10-30 DIAGNOSIS — N952 Postmenopausal atrophic vaginitis: Secondary | ICD-10-CM | POA: Diagnosis not present

## 2021-10-31 DIAGNOSIS — M9905 Segmental and somatic dysfunction of pelvic region: Secondary | ICD-10-CM | POA: Diagnosis not present

## 2021-10-31 DIAGNOSIS — M62830 Muscle spasm of back: Secondary | ICD-10-CM | POA: Diagnosis not present

## 2021-10-31 DIAGNOSIS — M9902 Segmental and somatic dysfunction of thoracic region: Secondary | ICD-10-CM | POA: Diagnosis not present

## 2021-10-31 DIAGNOSIS — M4607 Spinal enthesopathy, lumbosacral region: Secondary | ICD-10-CM | POA: Diagnosis not present

## 2021-11-09 DIAGNOSIS — M9902 Segmental and somatic dysfunction of thoracic region: Secondary | ICD-10-CM | POA: Diagnosis not present

## 2021-11-09 DIAGNOSIS — M4607 Spinal enthesopathy, lumbosacral region: Secondary | ICD-10-CM | POA: Diagnosis not present

## 2021-11-09 DIAGNOSIS — G8929 Other chronic pain: Secondary | ICD-10-CM | POA: Diagnosis not present

## 2021-11-09 DIAGNOSIS — M9905 Segmental and somatic dysfunction of pelvic region: Secondary | ICD-10-CM | POA: Diagnosis not present

## 2021-11-09 DIAGNOSIS — M25562 Pain in left knee: Secondary | ICD-10-CM | POA: Diagnosis not present

## 2021-11-09 DIAGNOSIS — Z96643 Presence of artificial hip joint, bilateral: Secondary | ICD-10-CM | POA: Diagnosis not present

## 2021-11-09 DIAGNOSIS — M1712 Unilateral primary osteoarthritis, left knee: Secondary | ICD-10-CM | POA: Diagnosis not present

## 2021-11-09 DIAGNOSIS — M62830 Muscle spasm of back: Secondary | ICD-10-CM | POA: Diagnosis not present

## 2021-11-09 DIAGNOSIS — M7071 Other bursitis of hip, right hip: Secondary | ICD-10-CM | POA: Diagnosis not present

## 2021-11-15 DIAGNOSIS — M62830 Muscle spasm of back: Secondary | ICD-10-CM | POA: Diagnosis not present

## 2021-11-15 DIAGNOSIS — M9902 Segmental and somatic dysfunction of thoracic region: Secondary | ICD-10-CM | POA: Diagnosis not present

## 2021-11-15 DIAGNOSIS — M4607 Spinal enthesopathy, lumbosacral region: Secondary | ICD-10-CM | POA: Diagnosis not present

## 2021-11-15 DIAGNOSIS — M9905 Segmental and somatic dysfunction of pelvic region: Secondary | ICD-10-CM | POA: Diagnosis not present

## 2021-11-21 DIAGNOSIS — Y9389 Activity, other specified: Secondary | ICD-10-CM | POA: Diagnosis not present

## 2021-11-21 DIAGNOSIS — M7071 Other bursitis of hip, right hip: Secondary | ICD-10-CM | POA: Diagnosis not present

## 2021-11-22 DIAGNOSIS — M62830 Muscle spasm of back: Secondary | ICD-10-CM | POA: Diagnosis not present

## 2021-11-22 DIAGNOSIS — M9905 Segmental and somatic dysfunction of pelvic region: Secondary | ICD-10-CM | POA: Diagnosis not present

## 2021-11-22 DIAGNOSIS — M4607 Spinal enthesopathy, lumbosacral region: Secondary | ICD-10-CM | POA: Diagnosis not present

## 2021-11-22 DIAGNOSIS — M9902 Segmental and somatic dysfunction of thoracic region: Secondary | ICD-10-CM | POA: Diagnosis not present

## 2021-11-27 DIAGNOSIS — R6882 Decreased libido: Secondary | ICD-10-CM | POA: Diagnosis not present

## 2021-11-29 DIAGNOSIS — M9902 Segmental and somatic dysfunction of thoracic region: Secondary | ICD-10-CM | POA: Diagnosis not present

## 2021-11-29 DIAGNOSIS — M62830 Muscle spasm of back: Secondary | ICD-10-CM | POA: Diagnosis not present

## 2021-11-29 DIAGNOSIS — M4607 Spinal enthesopathy, lumbosacral region: Secondary | ICD-10-CM | POA: Diagnosis not present

## 2021-11-29 DIAGNOSIS — M9905 Segmental and somatic dysfunction of pelvic region: Secondary | ICD-10-CM | POA: Diagnosis not present

## 2021-11-30 ENCOUNTER — Ambulatory Visit (INDEPENDENT_AMBULATORY_CARE_PROVIDER_SITE_OTHER): Payer: BC Managed Care – PPO | Admitting: Family Medicine

## 2021-11-30 ENCOUNTER — Other Ambulatory Visit: Payer: Self-pay | Admitting: Family Medicine

## 2021-11-30 VITALS — BP 136/86 | HR 67 | Temp 98.3°F | Ht 68.0 in | Wt 168.4 lb

## 2021-11-30 DIAGNOSIS — Z8659 Personal history of other mental and behavioral disorders: Secondary | ICD-10-CM

## 2021-11-30 DIAGNOSIS — I10 Essential (primary) hypertension: Secondary | ICD-10-CM

## 2021-11-30 DIAGNOSIS — Z23 Encounter for immunization: Secondary | ICD-10-CM | POA: Diagnosis not present

## 2021-11-30 DIAGNOSIS — M4800 Spinal stenosis, site unspecified: Secondary | ICD-10-CM

## 2021-11-30 DIAGNOSIS — G8929 Other chronic pain: Secondary | ICD-10-CM

## 2021-11-30 DIAGNOSIS — M5136 Other intervertebral disc degeneration, lumbar region: Secondary | ICD-10-CM

## 2021-11-30 MED ORDER — METOPROLOL TARTRATE 50 MG PO TABS: 50.0000 mg | ORAL_TABLET | Freq: Two times a day (BID) | ORAL | 3 refills | Status: DC

## 2021-11-30 MED ORDER — PREDNISONE 20 MG PO TABS: 40.0000 mg | ORAL_TABLET | Freq: Every day | ORAL | 0 refills | Status: AC

## 2021-11-30 MED ORDER — CYCLOBENZAPRINE HCL 10 MG PO TABS: 10.0000 mg | ORAL_TABLET | Freq: Three times a day (TID) | ORAL | 0 refills | Status: DC | PRN

## 2021-11-30 NOTE — Progress Notes (Signed)
Subjective:    Patient ID: Abigail Gomez, female    DOB: [birth year], 56 y.o.   MRN: [id number]  Chief Complaint  Patient presents with   Back Pain    Arthritis, spinal stenosis, cant get in with PM until next month, is asking for relaxants and possible dose pak for a month. Flare up started a week ago.     HPI Patient is a 56 year old female with pmh sig for h/o lyme dz, hypothyroidism, HTN, urinary incontinence, labral tear of right hip s/p R THR, spinal stenosis, sleep apnea, RLS, OA, migraines, chronic left ankle pain and other chronic pain, h/o sickle cell trait, fibromyalgia, s/p myomectomy, s/p hysterectomy, h/o depression, anxiety, vitamin D deficiency, TMJ, GERD was seen today for f/u and acute concern.  Pt endorses back pain flare. Increased pain upon sitting then standing.  Pain improves with leaning forward.  Unable to see pain management until next month.  Typically able to use flexeril and steroid pack to decrease symptoms.  Concerned about steroid overuse as had a hip injection a few wks ago.  In the past has tried alternative medicine options such as acupuncture, sauna, etc.  Close to needing refill on metoprolol tartrate 50 mg been ID for HTN.  Also taking losartan-hydrochlorothiazide 100-25 mg daily  Had a testosterone injection recently.  Pt under increased stress dealing with her own health challenges, caring for her parents, and continuing to work.  Patient's mom d/x'd w/ Lewy body dementia and her father is an amputee.  Patient trying to find resources for them.  Patient also mentions the recent deaths of her longtime pelvic floor physical therapist and longtime counselor.  Past Medical History:  Diagnosis Date   Allergy    Anemia    Anxiety    Depression    Disease    lymes disease   Fibromyalgia    H/o Lyme disease    H/O sickle cell trait    History of pelvic fracture    History of urinary incontinence    Hypertension    Hypothyroidism    Insomnia    Labral  tear of hip joint    Right Hip   Migraine    Osteoarthritis    RLS (restless legs syndrome)    Sleep apnea    cpap   Spinal stenosis    Thyroid disease    TMJ syndrome    Vitamin D deficiency     Allergies  Allergen Reactions   Peanut Oil Anaphylaxis   Penicillins Other (See Comments) and Anaphylaxis    Syncope Has patient had a PCN reaction causing immediate rash, facial/tongue/throat swelling, SOB or lightheadedness with hypotension:Yes Has patient had a PCN reaction causing severe rash involving mucus membranes or skin necrosis:No Has patient had a PCN reaction that required hospitalization:No Has patient had a PCN reaction occurring within the last 10 years:No If all of the above answers are "NO", then may proceed with Cephalosporin use.    Food     Nuts-itching/swelling Bananas-itching/swelling   Lactose Intolerance (Gi) Other (See Comments)    G.I. upset   Lactulose Other (See Comments)    G.I. upset   Lisinopril Cough   Other Itching    Nuts-itching/swelling Bananas-itching/swelling   Oxycodone-Acetaminophen Itching and Other (See Comments)    ROS General: Denies fever, chills, night sweats, changes in weight, changes in appetite HEENT: Denies headaches, ear pain, changes in vision, rhinorrhea, sore throat CV: Denies CP, palpitations, SOB, orthopnea Pulm: Denies SOB, cough, wheezing GI: Denies  abdominal pain, nausea, vomiting, diarrhea, constipation GU: Denies dysuria, hematuria, frequency, vaginal discharge Msk: Denies muscle cramps, joint pains +low back pain, hip pain, knee pain, fibromyalgia Neuro: Denies weakness, numbness, tingling Skin: Denies rashes, bruising + increased stress Psych: Denies depression, anxiety, hallucinations      Objective:    Blood pressure 136/86, pulse 67, temperature 98.3 F (36.8 C), temperature source Oral, height 5\' 8"  (1.727 m), weight 168 lb 6.4 oz (76.4 kg), SpO2 99 %.  Gen. Pleasant, well-nourished, in no distress,  normal affect   HEENT: [place]/AT, face symmetric, conjunctiva clear, no scleral icterus, PERRLA, EOMI, nares patent without drainage Lungs: no accessory muscle use, CTAB, no wheezes or rales Cardiovascular: RRR, no m/r/g, no peripheral edema Musculoskeletal: TTP of midline low back.  Slightly decreased strength in bilateral LEs.  No deformities, no cyanosis or clubbing, normal tone Neuro:  A&Ox3, CN II-XII intact, slowed gait Skin:  Warm, no lesions/ rash  Wt Readings from Last 3 Encounters:  02/22/21 164 lb 12.8 oz (74.8 kg)  12/13/20 171 lb 3.2 oz (77.7 kg)  08/25/20 171 lb 6.4 oz (77.7 kg)    Lab Results  Component Value Date   WBC 7.0 08/23/2020   HGB 12.9 08/23/2020   HCT 38 08/23/2020   PLT 220 08/23/2020   GLUCOSE 104 (H) 01/28/2020   CHOL 174 11/10/2018   TRIG 103.0 11/10/2018   HDL 52.10 11/10/2018   LDLCALC 101 (H) 11/10/2018   ALT 24 08/23/2020   AST 25 08/23/2020   NA 137 08/23/2020   K 4.0 08/23/2020   CL 97 (A) 08/23/2020   CREATININE 1.1 08/23/2020   BUN 11 08/23/2020   CO2 24 (A) 08/23/2020   TSH 0.48 08/23/2020   HGBA1C 5.1 08/23/2014      11/30/2021    2:45 PM 04/05/2017   10:05 AM 01/09/2017    8:05 AM  Depression screen PHQ 2/9  Decreased Interest 1 0 0  Down, Depressed, Hopeless 1 0 0  PHQ - 2 Score 2 0 0  Altered sleeping 1    Tired, decreased energy 1    Change in appetite 1    Feeling bad or failure about yourself  0    Trouble concentrating 2    Moving slowly or fidgety/restless 0    Suicidal thoughts 0    PHQ-9 Score 7    Difficult doing work/chores Somewhat difficult      Assessment/Plan:  Spinal stenosis, unspecified spinal region -increased symptoms -MRI lumbar spine 11/08/2016 with progressive degenerative disc bulge and facet hypertrophy at L4-5 with resultant moderate canal and bilateral subarticular stenosis, with moderate right worse than left foraminal narrowing.  Progressive degenerative disc bulge and facet hypertrophy at L2-3  and L3-4 with resultant mild canal and bilateral foraminal stenosis.  Tiny central disc protrusion at L5-S1 without significant stenosis or neural impingement. -Continue follow-up with Ortho and pain management -Discussed r/b/a of frequent steroid use. -Discussed prednisone burst and Flexeril 10 mg 3 times daily as needed to see if control of flare can be achieved until appointment with pain management and other specialist. -Continue supportive care including use of sauna, heat, massage, stretching daily, topical analgesics, etc.  - Plan: cyclobenzaprine (FLEXERIL) 10 MG tablet, predniSONE (DELTASONE) 20 MG tablet  Need for influenza vaccination  - Plan: Flu Vaccine QUAD 6+ mos PF IM (Fluarix Quad PF)  Other chronic pain  - Plan: cyclobenzaprine (FLEXERIL) 10 MG tablet  DDD (degenerative disc disease), lumbar -Continue follow-up with Ortho and pain  management  - Plan: cyclobenzaprine (FLEXERIL) 10 MG tablet, predniSONE (DELTASONE) 20 MG tablet  Essential hypertension -Controlled -Continue losartan-hydrochlorothiazide 100-25 mg daily and Lopressor 50 mg twice daily -Continue lifestyle modifications -Continue monitoring BP at home - Plan: metoprolol tartrate (LOPRESSOR) 50 MG tablet  History of depression -Stable -PHQ-9 score 7 this visit -Continue current medications -Given information on area [facility] providers due to previous long-term providers unexpected death. -Given precautions -Continue self-care -We will look for local (in [place]) resources for pt's parents.  F/u prn for continued or worsening symptoms.  Abbe Amsterdam, MD

## 2021-12-06 DIAGNOSIS — F331 Major depressive disorder, recurrent, moderate: Secondary | ICD-10-CM | POA: Diagnosis not present

## 2021-12-11 DIAGNOSIS — M47816 Spondylosis without myelopathy or radiculopathy, lumbar region: Secondary | ICD-10-CM | POA: Diagnosis not present

## 2021-12-13 DIAGNOSIS — M9905 Segmental and somatic dysfunction of pelvic region: Secondary | ICD-10-CM | POA: Diagnosis not present

## 2021-12-13 DIAGNOSIS — M4607 Spinal enthesopathy, lumbosacral region: Secondary | ICD-10-CM | POA: Diagnosis not present

## 2021-12-13 DIAGNOSIS — M9902 Segmental and somatic dysfunction of thoracic region: Secondary | ICD-10-CM | POA: Diagnosis not present

## 2021-12-13 DIAGNOSIS — M62830 Muscle spasm of back: Secondary | ICD-10-CM | POA: Diagnosis not present

## 2021-12-15 DIAGNOSIS — M9905 Segmental and somatic dysfunction of pelvic region: Secondary | ICD-10-CM | POA: Diagnosis not present

## 2021-12-15 DIAGNOSIS — Z9189 Other specified personal risk factors, not elsewhere classified: Secondary | ICD-10-CM | POA: Diagnosis not present

## 2021-12-15 DIAGNOSIS — N393 Stress incontinence (female) (male): Secondary | ICD-10-CM | POA: Diagnosis not present

## 2021-12-17 ENCOUNTER — Other Ambulatory Visit: Payer: Self-pay | Admitting: Family Medicine

## 2021-12-17 DIAGNOSIS — G43809 Other migraine, not intractable, without status migrainosus: Secondary | ICD-10-CM

## 2021-12-18 ENCOUNTER — Encounter: Payer: Self-pay | Admitting: Family Medicine

## 2021-12-19 DIAGNOSIS — M9905 Segmental and somatic dysfunction of pelvic region: Secondary | ICD-10-CM | POA: Diagnosis not present

## 2021-12-19 DIAGNOSIS — M62830 Muscle spasm of back: Secondary | ICD-10-CM | POA: Diagnosis not present

## 2021-12-19 DIAGNOSIS — M4607 Spinal enthesopathy, lumbosacral region: Secondary | ICD-10-CM | POA: Diagnosis not present

## 2021-12-19 DIAGNOSIS — M9902 Segmental and somatic dysfunction of thoracic region: Secondary | ICD-10-CM | POA: Diagnosis not present

## 2021-12-27 DIAGNOSIS — M47816 Spondylosis without myelopathy or radiculopathy, lumbar region: Secondary | ICD-10-CM | POA: Diagnosis not present

## 2021-12-28 DIAGNOSIS — Z113 Encounter for screening for infections with a predominantly sexual mode of transmission: Secondary | ICD-10-CM | POA: Diagnosis not present

## 2021-12-28 DIAGNOSIS — Z01419 Encounter for gynecological examination (general) (routine) without abnormal findings: Secondary | ICD-10-CM | POA: Diagnosis not present

## 2021-12-28 DIAGNOSIS — Z1231 Encounter for screening mammogram for malignant neoplasm of breast: Secondary | ICD-10-CM | POA: Diagnosis not present

## 2021-12-28 DIAGNOSIS — Z1272 Encounter for screening for malignant neoplasm of vagina: Secondary | ICD-10-CM | POA: Diagnosis not present

## 2021-12-28 DIAGNOSIS — M2550 Pain in unspecified joint: Secondary | ICD-10-CM | POA: Diagnosis not present

## 2021-12-28 DIAGNOSIS — Z6826 Body mass index (BMI) 26.0-26.9, adult: Secondary | ICD-10-CM | POA: Diagnosis not present

## 2022-01-01 DIAGNOSIS — M47816 Spondylosis without myelopathy or radiculopathy, lumbar region: Secondary | ICD-10-CM | POA: Diagnosis not present

## 2022-01-01 DIAGNOSIS — M4607 Spinal enthesopathy, lumbosacral region: Secondary | ICD-10-CM | POA: Diagnosis not present

## 2022-01-01 DIAGNOSIS — M9905 Segmental and somatic dysfunction of pelvic region: Secondary | ICD-10-CM | POA: Diagnosis not present

## 2022-01-01 DIAGNOSIS — M62830 Muscle spasm of back: Secondary | ICD-10-CM | POA: Diagnosis not present

## 2022-01-01 DIAGNOSIS — M9902 Segmental and somatic dysfunction of thoracic region: Secondary | ICD-10-CM | POA: Diagnosis not present

## 2022-01-02 DIAGNOSIS — M5459 Other low back pain: Secondary | ICD-10-CM | POA: Diagnosis not present

## 2022-01-02 DIAGNOSIS — M25551 Pain in right hip: Secondary | ICD-10-CM | POA: Diagnosis not present

## 2022-01-02 DIAGNOSIS — K5900 Constipation, unspecified: Secondary | ICD-10-CM | POA: Diagnosis not present

## 2022-01-02 DIAGNOSIS — M25552 Pain in left hip: Secondary | ICD-10-CM | POA: Diagnosis not present

## 2022-01-02 DIAGNOSIS — M6281 Muscle weakness (generalized): Secondary | ICD-10-CM | POA: Diagnosis not present

## 2022-01-02 DIAGNOSIS — N393 Stress incontinence (female) (male): Secondary | ICD-10-CM | POA: Diagnosis not present

## 2022-01-04 DIAGNOSIS — M9905 Segmental and somatic dysfunction of pelvic region: Secondary | ICD-10-CM | POA: Diagnosis not present

## 2022-01-04 DIAGNOSIS — M62830 Muscle spasm of back: Secondary | ICD-10-CM | POA: Diagnosis not present

## 2022-01-04 DIAGNOSIS — M9902 Segmental and somatic dysfunction of thoracic region: Secondary | ICD-10-CM | POA: Diagnosis not present

## 2022-01-04 DIAGNOSIS — M4607 Spinal enthesopathy, lumbosacral region: Secondary | ICD-10-CM | POA: Diagnosis not present

## 2022-01-09 DIAGNOSIS — M4607 Spinal enthesopathy, lumbosacral region: Secondary | ICD-10-CM | POA: Diagnosis not present

## 2022-01-09 DIAGNOSIS — M9902 Segmental and somatic dysfunction of thoracic region: Secondary | ICD-10-CM | POA: Diagnosis not present

## 2022-01-09 DIAGNOSIS — M9905 Segmental and somatic dysfunction of pelvic region: Secondary | ICD-10-CM | POA: Diagnosis not present

## 2022-01-09 DIAGNOSIS — M62830 Muscle spasm of back: Secondary | ICD-10-CM | POA: Diagnosis not present

## 2022-01-11 DIAGNOSIS — M6281 Muscle weakness (generalized): Secondary | ICD-10-CM | POA: Diagnosis not present

## 2022-01-11 DIAGNOSIS — M5459 Other low back pain: Secondary | ICD-10-CM | POA: Diagnosis not present

## 2022-01-11 DIAGNOSIS — M25552 Pain in left hip: Secondary | ICD-10-CM | POA: Diagnosis not present

## 2022-01-11 DIAGNOSIS — M25551 Pain in right hip: Secondary | ICD-10-CM | POA: Diagnosis not present

## 2022-01-15 DIAGNOSIS — M9902 Segmental and somatic dysfunction of thoracic region: Secondary | ICD-10-CM | POA: Diagnosis not present

## 2022-01-15 DIAGNOSIS — M9905 Segmental and somatic dysfunction of pelvic region: Secondary | ICD-10-CM | POA: Diagnosis not present

## 2022-01-15 DIAGNOSIS — M4607 Spinal enthesopathy, lumbosacral region: Secondary | ICD-10-CM | POA: Diagnosis not present

## 2022-01-15 DIAGNOSIS — M62830 Muscle spasm of back: Secondary | ICD-10-CM | POA: Diagnosis not present

## 2022-01-16 DIAGNOSIS — M25552 Pain in left hip: Secondary | ICD-10-CM | POA: Diagnosis not present

## 2022-01-16 DIAGNOSIS — M5459 Other low back pain: Secondary | ICD-10-CM | POA: Diagnosis not present

## 2022-01-16 DIAGNOSIS — N393 Stress incontinence (female) (male): Secondary | ICD-10-CM | POA: Diagnosis not present

## 2022-01-16 DIAGNOSIS — M25551 Pain in right hip: Secondary | ICD-10-CM | POA: Diagnosis not present

## 2022-01-16 DIAGNOSIS — M6281 Muscle weakness (generalized): Secondary | ICD-10-CM | POA: Diagnosis not present

## 2022-01-16 DIAGNOSIS — K5900 Constipation, unspecified: Secondary | ICD-10-CM | POA: Diagnosis not present

## 2022-01-17 ENCOUNTER — Ambulatory Visit (INDEPENDENT_AMBULATORY_CARE_PROVIDER_SITE_OTHER): Payer: BC Managed Care – PPO | Admitting: Family Medicine

## 2022-01-17 VITALS — BP 118/78 | HR 57 | Temp 98.3°F | Ht 68.0 in | Wt 167.2 lb

## 2022-01-17 DIAGNOSIS — M2550 Pain in unspecified joint: Secondary | ICD-10-CM

## 2022-01-17 DIAGNOSIS — E039 Hypothyroidism, unspecified: Secondary | ICD-10-CM

## 2022-01-17 DIAGNOSIS — M25551 Pain in right hip: Secondary | ICD-10-CM

## 2022-01-17 DIAGNOSIS — M797 Fibromyalgia: Secondary | ICD-10-CM

## 2022-01-17 DIAGNOSIS — G4733 Obstructive sleep apnea (adult) (pediatric): Secondary | ICD-10-CM

## 2022-01-17 DIAGNOSIS — A6920 Lyme disease, unspecified: Secondary | ICD-10-CM

## 2022-01-17 DIAGNOSIS — G8929 Other chronic pain: Secondary | ICD-10-CM | POA: Diagnosis not present

## 2022-01-17 DIAGNOSIS — Z0000 Encounter for general adult medical examination without abnormal findings: Secondary | ICD-10-CM

## 2022-01-17 DIAGNOSIS — I10 Essential (primary) hypertension: Secondary | ICD-10-CM

## 2022-01-17 DIAGNOSIS — K219 Gastro-esophageal reflux disease without esophagitis: Secondary | ICD-10-CM

## 2022-01-17 DIAGNOSIS — E785 Hyperlipidemia, unspecified: Secondary | ICD-10-CM

## 2022-01-17 DIAGNOSIS — G4710 Hypersomnia, unspecified: Secondary | ICD-10-CM

## 2022-01-17 LAB — COMPREHENSIVE METABOLIC PANEL
ALT: 25 U/L (ref 0–35)
AST: 27 U/L (ref 0–37)
Albumin: 4.2 g/dL (ref 3.5–5.2)
Alkaline Phosphatase: 88 U/L (ref 39–117)
BUN: 8 mg/dL (ref 6–23)
CO2: 31 mEq/L (ref 19–32)
Calcium: 9.6 mg/dL (ref 8.4–10.5)
Chloride: 100 mEq/L (ref 96–112)
Creatinine, Ser: 0.9 mg/dL (ref 0.40–1.20)
GFR: 72.57 mL/min (ref >60.00)
Glucose, Bld: 91 mg/dL (ref 70–99)
Potassium: 4 mEq/L (ref 3.5–5.1)
Sodium: 135 mEq/L (ref 135–145)
Total Bilirubin: 0.6 mg/dL (ref 0.2–1.2)
Total Protein: 6.9 g/dL (ref 6.0–8.3)

## 2022-01-17 LAB — SEDIMENTATION RATE: Sed Rate: 12 mm/hr (ref 0–30)

## 2022-01-17 LAB — CBC WITH DIFFERENTIAL/PLATELET
Basophils Absolute: 0 10*3/uL (ref 0.0–0.1)
Basophils Relative: 0.4 % (ref 0.0–3.0)
Eosinophils Absolute: 0.1 10*3/uL (ref 0.0–0.7)
Eosinophils Relative: 0.8 % (ref 0.0–5.0)
HCT: 36.8 % (ref 36.0–46.0)
Hemoglobin: 12.9 g/dL (ref 12.0–15.0)
Lymphocytes Relative: 54.5 % — ABNORMAL HIGH (ref 12.0–46.0)
Lymphs Abs: 3.7 10*3/uL (ref 0.7–4.0)
MCHC: 35 g/dL (ref 30.0–36.0)
MCV: 88.4 fl (ref 78.0–100.0)
Monocytes Absolute: 0.6 10*3/uL (ref 0.1–1.0)
Monocytes Relative: 8.1 % (ref 3.0–12.0)
Neutro Abs: 2.5 10*3/uL (ref 1.4–7.7)
Neutrophils Relative %: 36.2 % — ABNORMAL LOW (ref 43.0–77.0)
Platelets: 159 10*3/uL (ref 150.0–400.0)
RBC: 4.16 Mil/uL (ref 3.87–5.11)
RDW: 13.2 % (ref 11.5–15.5)
WBC: 6.8 10*3/uL (ref 4.0–10.5)

## 2022-01-17 LAB — LIPID PANEL
Cholesterol: 169 mg/dL (ref 0–200)
HDL: 55.4 mg/dL (ref >39.00)
LDL Cholesterol: 103 mg/dL — ABNORMAL HIGH (ref 0–99)
NonHDL: 113.81
Total CHOL/HDL Ratio: 3
Triglycerides: 54 mg/dL (ref 0.0–149.0)
VLDL: 10.8 mg/dL (ref 0.0–40.0)

## 2022-01-17 LAB — T4, FREE: Free T4: 1.01 ng/dL (ref 0.60–1.60)

## 2022-01-17 LAB — T3, FREE: T3, Free: 3.5 pg/mL (ref 2.3–4.2)

## 2022-01-17 LAB — TSH: TSH: 1.5 u[IU]/mL (ref 0.35–5.50)

## 2022-01-17 NOTE — Progress Notes (Signed)
Subjective:     Abigail Gomez is a 56 y.o. female and is here for a comprehensive physical exam. The patient reports having a pain flair.  Having increased joint pain and back, hips, left knee.  Patient status post THR with revisions.  Right leg longer than left.  Was scheduled to have an epidural injection, but her cousin caused her to miss the appt. injection rescheduled for the end of November.  Pt trying to find a new rheumatologist.  P previously seen by Dr. Nyra Jabs, but she no longer sees fibromyalgia patients.  Patient also has history of Lyme disease, chronic joint pain and inflammation.  Patient interested in finding out why she continues to have chronic inflammation.    Social History   Socioeconomic History   Marital status: Single    Spouse name: Not on file   Number of children: Not on file   Years of education: Not on file   Highest education level: Not on file  Occupational History   Not on file  Tobacco Use   Smoking status: Never   Smokeless tobacco: Never  Vaping Use   Vaping Use: Never used  Substance and Sexual Activity   Alcohol use: Yes    Alcohol/week: 0.0 standard drinks of alcohol    Comment: occ   Drug use: No   Sexual activity: Not on file  Other Topics Concern   Not on file  Social History Narrative   Not on file   Social Determinants of Health   Financial Resource Strain: Not on file  Food Insecurity: Not on file  Transportation Needs: Not on file  Physical Activity: Not on file  Stress: Not on file  Social Connections: Not on file  Intimate Partner Violence: Not on file   Health Maintenance  Topic Date Due   COVID-19 Vaccine (1) Never done   HIV Screening  Never done   Hepatitis C Screening  Never done   PAP SMEAR-Modifier  03/03/2017   MAMMOGRAM  09/22/2017   Zoster Vaccines- Shingrix (1 of 2) Never done   TETANUS/TDAP  02/26/2018   COLONOSCOPY (Pts 45-49yrs Insurance coverage will need to be confirmed)  07/30/2029   INFLUENZA  VACCINE  Completed   HPV VACCINES  Aged Out    The following portions of the patient's history were reviewed and updated as appropriate: allergies, current medications, past family history, past medical history, past social history, past surgical history, and problem list.  Review of Systems Pertinent items noted in HPI and remainder of comprehensive ROS otherwise negative.   Objective:    BP 118/78 (BP Location: Left Arm, Patient Position: Sitting, Cuff Size: Normal)   Pulse (!) 57   Temp 98.3 F (36.8 C) (Oral)   Ht 5\' 8"  (1.727 m)   Wt 167 lb 3.2 oz (75.8 kg)   SpO2 98%   BMI 25.42 kg/m  General appearance: alert, cooperative, and no distress Head: Normocephalic, without obvious abnormality, atraumatic Eyes: conjunctivae/corneas clear. PERRL, EOM's intact. Fundi benign. Ears: normal TM's and external ear canals both ears Nose: Nares normal. Septum midline. Mucosa normal. No drainage or sinus tenderness. Throat: lips, mucosa, and tongue normal; teeth and gums normal Neck: no adenopathy, no carotid bruit, no JVD, supple, symmetrical, trachea midline, and thyroid not enlarged, symmetric, no tenderness/mass/nodules Lungs: clear to auscultation bilaterally Heart: regular rate and rhythm, S1, S2 normal, no murmur, click, rub or gallop Abdomen: soft, non-tender; bowel sounds normal; no masses,  no organomegaly Extremities: extremities normal, atraumatic, no  cyanosis or edema ambulating with cane. Pulses: 2+ and symmetric Skin: Skin color, texture, turgor normal. No rashes or lesions Lymph nodes: Cervical, supraclavicular, and axillary nodes normal. Neurologic: Alert and oriented X 3, normal strength and tone. Normal symmetric reflexes. Normal coordination and gait    Assessment:    Healthy female exam.      Plan:    Anticipatory guidance given including wearing seatbelts, smoke detectors in the home, increasing physical activity, increasing p.o. intake of water and  vegetables. -labs -Patient to schedule mammogram -Immunizations reviewed-colonoscopy done 07/31/19 -Continue follow-up with your gynecologist, Dr. Ashley Royalty at [facility] See After Visit Summary for Counseling Recommendations  Well adult exam  Primary hypertension -Controlled -Continue current medications including Lopressor 50 mg twice daily, losartan-hydrochlorothiazide 100-25 mg half tab daily -Continue lifestyle modifications  OSA (obstructive sleep apnea) -CPAP -Continue follow-up with pulm  Hypothyroidism, unspecified type -Continue Armour Thyroid 30 mg - Plan: TSH, T4, Free, T3, Free  Gastroesophageal reflux disease without esophagitis -Avoid foods known to cause symptoms -Continue Pepcid 20 mg -Continue follow-up with GI  Fibromyalgia  - Plan: CBC with Differential/Platelet, Sedimentation Rate, Rheumatoid Factor, ANA, Lupus (SLE) Analysis, Lyme Disease Serology w/Reflex, Ambulatory referral to Rheumatology--Dr. Laney Pastor in [place]  Hyperlipidemia, unspecified hyperlipidemia type -LDL 101 on 11/10/2018 -Continue lifestyle modifications - Plan: CBC with Differential/Platelet, Lipid panel  Hypersomnia  Pain of right hip joint  - Plan: Ambulatory referral to Rheumatology  Chronic joint pain  - Plan: CBC with Differential/Platelet, CMP, Sedimentation Rate, Rheumatoid Factor, ANA, Lupus (SLE) Analysis, Lyme Disease Serology w/Reflex, Ambulatory referral to Rheumatology  Lyme disease -History of in the past?    - Plan: Ambulatory referral to Rheumatology, Lyme disease serology w/Reflex  Follow-up as needed  Abbe Amsterdam, MD

## 2022-01-17 NOTE — Patient Instructions (Signed)
Referrals placed for you to see Dr. Laney Pastor in [place].  If you have not heard about scheduling this appointment please office.

## 2022-01-18 DIAGNOSIS — M9902 Segmental and somatic dysfunction of thoracic region: Secondary | ICD-10-CM | POA: Diagnosis not present

## 2022-01-18 DIAGNOSIS — M5459 Other low back pain: Secondary | ICD-10-CM | POA: Diagnosis not present

## 2022-01-18 DIAGNOSIS — M25552 Pain in left hip: Secondary | ICD-10-CM | POA: Diagnosis not present

## 2022-01-18 DIAGNOSIS — M62830 Muscle spasm of back: Secondary | ICD-10-CM | POA: Diagnosis not present

## 2022-01-18 DIAGNOSIS — M9905 Segmental and somatic dysfunction of pelvic region: Secondary | ICD-10-CM | POA: Diagnosis not present

## 2022-01-18 DIAGNOSIS — M25551 Pain in right hip: Secondary | ICD-10-CM | POA: Diagnosis not present

## 2022-01-18 DIAGNOSIS — M4607 Spinal enthesopathy, lumbosacral region: Secondary | ICD-10-CM | POA: Diagnosis not present

## 2022-01-18 DIAGNOSIS — M6281 Muscle weakness (generalized): Secondary | ICD-10-CM | POA: Diagnosis not present

## 2022-01-18 LAB — LUPUS (SLE) ANALYSIS
Anti Nuclear Antibody (ANA): NEGATIVE
Anti-striation Abs: NEGATIVE
Complement C4, Serum: 28 mg/dL (ref 12–38)
ENA RNP Ab: <0.2 AI (ref 0.0–0.9)
ENA SM Ab Ser-aCnc: <0.2 AI (ref 0.0–0.9)
ENA SSA (RO) Ab: <0.2 AI (ref 0.0–0.9)
ENA SSB (LA) Ab: <0.2 AI (ref 0.0–0.9)
Mitochondrial Ab: <20 Units (ref 0.0–20.0)
Parietal Cell Ab: 1.4 Units (ref 0.0–20.0)
Scleroderma (Scl-70) (ENA) Antibody, IgG: <0.2 AI (ref 0.0–0.9)
Smooth Muscle Ab: 6 Units (ref 0–19)
Thyroperoxidase Ab SerPl-aCnc: 9 IU/mL (ref 0–34)
dsDNA Ab: <1 IU/mL (ref 0–9)

## 2022-01-18 LAB — LYME DISEASE SEROLOGY W/REFLEX: Lyme Total Antibody EIA: NEGATIVE

## 2022-01-23 DIAGNOSIS — K5900 Constipation, unspecified: Secondary | ICD-10-CM | POA: Diagnosis not present

## 2022-01-23 DIAGNOSIS — N393 Stress incontinence (female) (male): Secondary | ICD-10-CM | POA: Diagnosis not present

## 2022-01-23 DIAGNOSIS — M6281 Muscle weakness (generalized): Secondary | ICD-10-CM | POA: Diagnosis not present

## 2022-01-23 LAB — ANA: Anti Nuclear Antibody (ANA): POSITIVE — AB

## 2022-01-23 LAB — ANTI-NUCLEAR AB-TITER (ANA TITER): ANA Titer 1: 1:80 {titer} — ABNORMAL HIGH

## 2022-01-23 LAB — RHEUMATOID FACTOR: Rheumatoid fact SerPl-aCnc: <14 IU/mL (ref <14)

## 2022-01-24 ENCOUNTER — Telehealth: Payer: Self-pay | Admitting: Family Medicine

## 2022-01-24 DIAGNOSIS — M25552 Pain in left hip: Secondary | ICD-10-CM | POA: Diagnosis not present

## 2022-01-24 DIAGNOSIS — M62830 Muscle spasm of back: Secondary | ICD-10-CM | POA: Diagnosis not present

## 2022-01-24 DIAGNOSIS — F331 Major depressive disorder, recurrent, moderate: Secondary | ICD-10-CM | POA: Diagnosis not present

## 2022-01-24 DIAGNOSIS — M25551 Pain in right hip: Secondary | ICD-10-CM | POA: Diagnosis not present

## 2022-01-24 DIAGNOSIS — M4607 Spinal enthesopathy, lumbosacral region: Secondary | ICD-10-CM | POA: Diagnosis not present

## 2022-01-24 DIAGNOSIS — M9902 Segmental and somatic dysfunction of thoracic region: Secondary | ICD-10-CM | POA: Diagnosis not present

## 2022-01-24 DIAGNOSIS — M5459 Other low back pain: Secondary | ICD-10-CM | POA: Diagnosis not present

## 2022-01-24 DIAGNOSIS — M6281 Muscle weakness (generalized): Secondary | ICD-10-CM | POA: Diagnosis not present

## 2022-01-24 DIAGNOSIS — M9905 Segmental and somatic dysfunction of pelvic region: Secondary | ICD-10-CM | POA: Diagnosis not present

## 2022-01-24 NOTE — Telephone Encounter (Signed)
Pt called to request a Rheumo referral to see Dr. Lucina Mellow. Pt states MD has that contact information.

## 2022-01-25 DIAGNOSIS — M6281 Muscle weakness (generalized): Secondary | ICD-10-CM | POA: Diagnosis not present

## 2022-01-25 DIAGNOSIS — M25551 Pain in right hip: Secondary | ICD-10-CM | POA: Diagnosis not present

## 2022-01-25 DIAGNOSIS — M25552 Pain in left hip: Secondary | ICD-10-CM | POA: Diagnosis not present

## 2022-01-25 DIAGNOSIS — M5459 Other low back pain: Secondary | ICD-10-CM | POA: Diagnosis not present

## 2022-01-29 ENCOUNTER — Encounter: Payer: Self-pay | Admitting: Family Medicine

## 2022-01-29 DIAGNOSIS — M5459 Other low back pain: Secondary | ICD-10-CM | POA: Diagnosis not present

## 2022-01-29 DIAGNOSIS — M6281 Muscle weakness (generalized): Secondary | ICD-10-CM | POA: Diagnosis not present

## 2022-01-29 DIAGNOSIS — M25551 Pain in right hip: Secondary | ICD-10-CM | POA: Diagnosis not present

## 2022-01-29 DIAGNOSIS — M25552 Pain in left hip: Secondary | ICD-10-CM | POA: Diagnosis not present

## 2022-01-30 ENCOUNTER — Telehealth: Payer: Self-pay | Admitting: Family Medicine

## 2022-01-30 DIAGNOSIS — G4733 Obstructive sleep apnea (adult) (pediatric): Secondary | ICD-10-CM

## 2022-01-30 NOTE — Telephone Encounter (Addendum)
Pt called to FU on the pulmonology referral MD said she would create for Pt, as per discussion had during last visit .  Only a Rheumatology referral was found.

## 2022-02-05 DIAGNOSIS — M25552 Pain in left hip: Secondary | ICD-10-CM | POA: Diagnosis not present

## 2022-02-05 DIAGNOSIS — M25551 Pain in right hip: Secondary | ICD-10-CM | POA: Diagnosis not present

## 2022-02-05 DIAGNOSIS — M6281 Muscle weakness (generalized): Secondary | ICD-10-CM | POA: Diagnosis not present

## 2022-02-05 DIAGNOSIS — M5459 Other low back pain: Secondary | ICD-10-CM | POA: Diagnosis not present

## 2022-02-06 DIAGNOSIS — K5900 Constipation, unspecified: Secondary | ICD-10-CM | POA: Diagnosis not present

## 2022-02-06 DIAGNOSIS — M25552 Pain in left hip: Secondary | ICD-10-CM | POA: Diagnosis not present

## 2022-02-06 DIAGNOSIS — N393 Stress incontinence (female) (male): Secondary | ICD-10-CM | POA: Diagnosis not present

## 2022-02-06 DIAGNOSIS — M25551 Pain in right hip: Secondary | ICD-10-CM | POA: Diagnosis not present

## 2022-02-06 DIAGNOSIS — M6281 Muscle weakness (generalized): Secondary | ICD-10-CM | POA: Diagnosis not present

## 2022-02-06 DIAGNOSIS — M5459 Other low back pain: Secondary | ICD-10-CM | POA: Diagnosis not present

## 2022-02-06 NOTE — Telephone Encounter (Signed)
Referral placed.

## 2022-02-06 NOTE — Telephone Encounter (Signed)
Pt called to FU on this Pulmonology referral request.    Pt states she prefers to go to:  [address] [place], [place], [place] [address]  Phone: [phone]

## 2022-02-07 ENCOUNTER — Ambulatory Visit (INDEPENDENT_AMBULATORY_CARE_PROVIDER_SITE_OTHER): Payer: BC Managed Care – PPO | Admitting: Family Medicine

## 2022-02-07 ENCOUNTER — Telehealth: Payer: Self-pay | Admitting: Family Medicine

## 2022-02-07 VITALS — BP 120/82 | HR 70 | Temp 99.2°F | Ht 68.0 in | Wt 168.6 lb

## 2022-02-07 DIAGNOSIS — G4733 Obstructive sleep apnea (adult) (pediatric): Secondary | ICD-10-CM

## 2022-02-07 DIAGNOSIS — R32 Unspecified urinary incontinence: Secondary | ICD-10-CM | POA: Diagnosis not present

## 2022-02-07 DIAGNOSIS — R400 Somnolence: Secondary | ICD-10-CM | POA: Diagnosis not present

## 2022-02-07 DIAGNOSIS — M47816 Spondylosis without myelopathy or radiculopathy, lumbar region: Secondary | ICD-10-CM | POA: Diagnosis not present

## 2022-02-07 DIAGNOSIS — M654 Radial styloid tenosynovitis [de Quervain]: Secondary | ICD-10-CM | POA: Diagnosis not present
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
## 2022-02-07 NOTE — Telephone Encounter (Signed)
Pt states the provider for referral [phone] is saying they have not received the referral that was sent on 01/17/22. Asking to have it resent and someone call her to advise that it was sent.

## 2022-02-07 NOTE — Patient Instructions (Signed)
The referral for the rheumatologist was placed on 01/17/2022.  Per notes from referral info sent to rheumatology office on that date.  Referral was placed for you to see hand surgery.  They should contact you in regards to scheduling this appointment.

## 2022-02-08 DIAGNOSIS — M2170 Unequal limb length (acquired), unspecified site: Secondary | ICD-10-CM | POA: Diagnosis not present

## 2022-02-08 DIAGNOSIS — M5459 Other low back pain: Secondary | ICD-10-CM | POA: Diagnosis not present

## 2022-02-08 DIAGNOSIS — M25551 Pain in right hip: Secondary | ICD-10-CM | POA: Diagnosis not present

## 2022-02-08 DIAGNOSIS — Z96641 Presence of right artificial hip joint: Secondary | ICD-10-CM | POA: Diagnosis not present

## 2022-02-08 DIAGNOSIS — Z96643 Presence of artificial hip joint, bilateral: Secondary | ICD-10-CM | POA: Diagnosis not present

## 2022-02-08 DIAGNOSIS — M6281 Muscle weakness (generalized): Secondary | ICD-10-CM | POA: Diagnosis not present

## 2022-02-08 DIAGNOSIS — M25552 Pain in left hip: Secondary | ICD-10-CM | POA: Diagnosis not present

## 2022-02-08 DIAGNOSIS — Z471 Aftercare following joint replacement surgery: Secondary | ICD-10-CM | POA: Diagnosis not present

## 2022-02-08 NOTE — Telephone Encounter (Signed)
Referral was placed at time of last OFV a few wks ago.

## 2022-02-09 DIAGNOSIS — M62830 Muscle spasm of back: Secondary | ICD-10-CM | POA: Diagnosis not present

## 2022-02-09 DIAGNOSIS — M4607 Spinal enthesopathy, lumbosacral region: Secondary | ICD-10-CM | POA: Diagnosis not present

## 2022-02-09 DIAGNOSIS — M9902 Segmental and somatic dysfunction of thoracic region: Secondary | ICD-10-CM | POA: Diagnosis not present

## 2022-02-09 DIAGNOSIS — M9905 Segmental and somatic dysfunction of pelvic region: Secondary | ICD-10-CM | POA: Diagnosis not present

## 2022-02-12 ENCOUNTER — Ambulatory Visit (INDEPENDENT_AMBULATORY_CARE_PROVIDER_SITE_OTHER): Payer: BC Managed Care – PPO | Admitting: Nurse Practitioner

## 2022-02-12 ENCOUNTER — Encounter: Payer: Self-pay | Admitting: Nurse Practitioner

## 2022-02-12 VITALS — BP 126/80 | HR 55 | Temp 98.4°F | Ht 68.0 in | Wt 168.2 lb

## 2022-02-12 DIAGNOSIS — G4710 Hypersomnia, unspecified: Secondary | ICD-10-CM

## 2022-02-12 DIAGNOSIS — F5101 Primary insomnia: Secondary | ICD-10-CM

## 2022-02-12 DIAGNOSIS — G4733 Obstructive sleep apnea (adult) (pediatric): Secondary | ICD-10-CM | POA: Diagnosis not present

## 2022-02-12 NOTE — Assessment & Plan Note (Signed)
Chronic insomnia. Likely multifactorial related to mental health conditions, chronic hip pain, fibromyalgia, and possibly untreated OSA. She is currently taking Ambien. Has tried trazodone and Lunesta in the past. See above plan.

## 2022-02-12 NOTE — Patient Instructions (Addendum)
Given your symptoms, I am concerned that you still have untreated sleep apnea. You will need an urgent in lab study for further evaluation. Someone will contact you to schedule this.  We discussed how untreated sleep apnea puts an individual at risk for cardiac arrhthymias, pulm HTN, DM, stroke and increases their risk for daytime accidents. We also briefly reviewed treatment options including weight loss, side sleeping position, oral appliance, CPAP therapy or referral to ENT for possible surgical options  Use extreme caution when driving. Try to schedule a 20 minute nap around the time you get tired.   Follow up in 4 weeks with Dr. Wynona Neat (new pt 30 min slot) to discuss sleep study results. If symptoms worsen, please contact office for sooner follow up or seek emergency care.
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
## 2022-02-12 NOTE — Assessment & Plan Note (Signed)
I believe most of her hypersomnia comes from poor quality sleep, multiple co-morbidities, and possibly untreated OSA. We reviewed sleep hygiene today. Lower suspicion for narcolepsy; although, we may need to consider MSLT if above workup is unrevealing or she continues to have difficulties despite appropriate therapies.

## 2022-02-12 NOTE — Progress Notes (Signed)
_0  ID: Abigail Gomez, female    DOB: [birth year], 56 y.o.   MRN: [id number]  Chief Complaint  Patient presents with   Consult    Haven't used cpap since 2019. Was getting sinus infection.     Referring provider: Billie Ruddy, MD  HPI: 56 year old female, never smoker referred for sleep consult. Past medical history significant for migraines, HTN, mild OSA previously on OSA, GERD, hypothyroid, OA of hip, insomnia, hypersomnia, anxiety and depression, fibromyalgia, RLS, sickle cell trait, s/p bunionectomy, HLD.   TEST/EVENTS:   02/12/2022: Today - sleep consult Patient presents today for sleep consult. She was previously seen by Dr. Corrie Dandy in 2017 for sleep apnea. Underwent HST which revealed mild OSA with AHI 7.7/h. She was started on CPAP therapy. She had a lot of trouble with nasal congestion and recurrent sinus infections with PAP therapy. Never tried any other mask besides a nasal. She stopped using it around 1 year ago, if not longer.  She has a longstanding history of trouble with sleeping, which she believes is multifactorial. She has pain in her hips and fibromyalgia so she has trouble getting comfortable, which causes her to toss and turn throughout the night. She has trouble with sleep onset as well. Has tried numerous medications including trazodone, melatonin, lunesta and is currently on 12.5 mg of ambien, which works some nights but not others. She is exhausted throughout the day. Wakes feeling poorly rested. Struggles to stay awake, especially with driving. She has dozed off before. 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 Finds the hours of 10-11:30 am are the hardest to stay awake. Doesn't schedule naps because she has trouble falling asleep. She has occasional morning headaches and dry mouth. She does grind her teeth at night - wears a mouth guard. She has been told she breathes very loudly at night and has even stopped breathing. Previous  episodes of sleep paralysis but these are infrequent. No sudden sleep attacks. Denies sleep parasomnias, snoring. No history of narcolepsy or cataplexy.  She goes to sleep around 11 pm; takes around 2 hours to fall asleep. Wakes multiple times. Gets out of the bed around 7 am. She's down 10 lb over the last two years.  She has a history of hypertension and chronic headaches. No history of stroke or cardiac arrhythmias. She's had multiple surgical procedures over the past 20 years.  She is a never smoker. Doesn't drink alcohol. No caffeine use. She lives by herself. She has a family history of allergies, heart disease, rheumatism.   Epworth 0  Allergies  Allergen Reactions   Peanut Oil Anaphylaxis   Penicillins Other (See Comments) and Anaphylaxis    Syncope Has patient had a PCN reaction causing immediate rash, facial/tongue/throat swelling, SOB or lightheadedness with hypotension:Yes Has patient had a PCN reaction causing severe rash involving mucus membranes or skin necrosis:No Has patient had a PCN reaction that required hospitalization:No Has patient had a PCN reaction occurring within the last 10 years:No If all of the above answers are "NO", then may proceed with Cephalosporin use.    Food     Nuts-itching/swelling Bananas-itching/swelling   Lactose Intolerance (Gi) Other (See Comments)    G.I. upset   Lactulose Other (See Comments)    G.I. upset   Lisinopril Cough   Other Itching    Nuts-itching/swelling Bananas-itching/swelling   Oxycodone-Acetaminophen Itching and Other (See Comments)    Immunization History  Administered  Date(s) Administered   Hep A / Hep B 03/08/2008, 09/06/2008   Hepatitis B 04/08/2008   Hepatitis B, PED/ADOLESCENT 04/08/2008   Influenza Split 12/01/2012, 12/22/2013, 12/22/2014   Influenza,inj,Quad PF,6+ Mos 12/16/2009, 01/02/2016, 11/22/2016, 11/30/2021   Influenza,inj,quad, With Preservative 02/28/2009, 11/16/2010, 11/22/2014, 01/02/2016,  11/30/2017   Influenza-Unspecified 12/01/2012, 12/22/2013, 11/22/2014, 01/02/2016   MMR 03/08/2008, 02/28/2009   Meningococcal Conjugate 02/28/2009   Moderna Sars-Covid-2 Vaccination 01/10/2022   PPD Test 12/22/2013, 05/04/2014, 12/08/2014, 01/02/2016   Pneumococcal-Unspecified 02/28/2009   Tdap 02/27/2008    Past Medical History:  Diagnosis Date   Allergy    Anemia    Anxiety    Depression    Disease    lymes disease   Fibromyalgia    H/o Lyme disease    H/O sickle cell trait    History of pelvic fracture    History of urinary incontinence    Hypertension    Hypothyroidism    Insomnia    Labral tear of hip joint    Right Hip   Migraine    Osteoarthritis    RLS (restless legs syndrome)    Sleep apnea    cpap   Spinal stenosis    Thyroid disease    TMJ syndrome    Vitamin D deficiency     Tobacco History: Social History   Tobacco Use  Smoking Status Never  Smokeless Tobacco Never   Counseling given: Not Answered   Outpatient Medications Prior to Visit  Medication Sig Dispense Refill   ALPRAZolam (XANAX) 0.5 MG tablet Take 0.5 mg by mouth at bedtime as needed for anxiety.     amitriptyline (ELAVIL) 10 MG tablet TAKE 4 TABLETS (40 MG TOTAL) BY MOUTH AT BEDTIME. 336 tablet 29   ARMOUR THYROID 30 MG tablet TAKE 1 TABLET BY MOUTH EVERY DAY 90 tablet 1   cholecalciferol (VITAMIN D) 1000 units tablet Take 5,000 Units by mouth daily.      cyclobenzaprine (FLEXERIL) 10 MG tablet Take 1 tablet (10 mg total) by mouth 3 (three) times daily as needed for muscle spasms. 90 tablet 0   estradiol (CLIMARA - DOSED IN MG/24 HR) 0.1 mg/24hr patch Place 0.1 mg onto the skin 2 (two) times a week.     lidocaine (LIDODERM) 5 % PLACE 1 PATCH ONTO THE SKIN DAILY. REMOVE & DISCARD PATCH WITHIN 12 HOURS OR AS DIRECTED BY MD 90 patch 3   losartan-hydrochlorothiazide (HYZAAR) 100-25 MG tablet TAKE 1/2 TABLET BY MOUTH DAILY 45 tablet 1   Lurasidone HCl (LATUDA PO) Take 30 mg by mouth  daily.     Magnesium Amino Acid Chelate 20 % POWD Take 450 mg by mouth at bedtime.     Melatonin 10 MG TABS Take 10 mg by mouth at bedtime.      metoprolol tartrate (LOPRESSOR) 50 MG tablet Take 1 tablet (50 mg total) by mouth 2 (two) times daily. 180 tablet 3   nitrofurantoin, macrocrystal-monohydrate, (MACROBID) 100 MG capsule Take 100 mg by mouth.     oxybutynin (DITROPAN-XL) 5 MG 24 hr tablet Take 1 tablet every day by oral route for 30 days.     rizatriptan (MAXALT-MLT) 10 MG disintegrating tablet DISSOLVE 1 TABLET BY MOUTH DAILY AS NEEDED FOR MIGRAINE 10 tablet 4   sulfamethoxazole-trimethoprim (BACTRIM DS) 800-160 MG tablet Take 1 tablet by mouth. One tablet before having intercourse.     VITAMIN B COMPLEX-C PO Take 1 capsule by mouth daily.     vortioxetine HBr (TRINTELLIX) 20 MG TABS tablet  Take 20 mg by mouth daily.      zolpidem (AMBIEN CR) 6.25 MG CR tablet Take 6.25 mg by mouth at bedtime as needed for sleep.     zonisamide (ZONEGRAN) 50 MG capsule Take 150 mg by mouth at bedtime.     No facility-administered medications prior to visit.     Review of Systems:   Constitutional: No weight loss or gain, night sweats, fevers, chills, or lassitude. +excessive daytime fatigue HEENT: No tooth/dental problems, or sore throat. No sneezing, itching, ear ache, or post nasal drip. +chronic swallowing difficulties, headaches, nasal congestion, sneezing CV:  No chest pain, orthopnea, PND, swelling in lower extremities, anasarca, dizziness, palpitations, syncope Resp: No shortness of breath with exertion or at rest. No excess mucus or change in color of mucus. No productive or non-productive. No hemoptysis. No wheezing.  No chest wall deformity GI:  +heartburn, indigestion (controlled with medication). No abdominal pain, nausea, vomiting, diarrhea, change in bowel habits GU: No dysuria, change in color of urine, urgency or frequency.  Skin: No rash, lesions, ulcerations MSK:  +chronic hip and  back pain.  No decreased range of motion.  Neuro: No dizziness or lightheadedness.  Psych: +anxiety/depression. No SI/HI. Mood stable.     Physical Exam:  BP 126/80 (BP Location: Right Arm, Cuff Size: Normal)   Pulse (!) 55   Temp 98.4 F (36.9 C) (Oral)   Ht _0  (1.727 m)   Wt 168 lb 3.2 oz (76.3 kg)   SpO2 100%   BMI 25.57 kg/m   GEN: Pleasant, interactive, well-appearing; in no acute distress HEENT:  Normocephalic and atraumatic. PERRLA. Sclera white. Nasal turbinates pink, moist and patent bilaterally. No rhinorrhea present. Oropharynx pink and moist, without exudate or edema. No lesions, ulcerations, or postnasal drip. Mallampati I/II NECK:  Supple w/ fair ROM. No JVD present. Normal carotid impulses w/o bruits. Thyroid symmetrical with no goiter or nodules palpated. No lymphadenopathy.   CV: RRR, no m/r/g, no peripheral edema. Pulses intact, +2 bilaterally. No cyanosis, pallor or clubbing. PULMONARY:  Unlabored, regular breathing. Clear bilaterally A&P w/o wheezes/rales/rhonchi. No accessory muscle use.  GI: BS present and normoactive. Soft, non-tender to palpation. No organomegaly or masses detected.  MSK: No erythema, warmth or tenderness. Cap refil <2 sec all extrem. No deformities or joint swelling noted.  Neuro: A/Ox3. No focal deficits noted.   Skin: Warm, no lesions or rashe Psych: Normal affect and behavior. Judgement and thought content appropriate.     Lab Results:  CBC    Component Value Date/Time   WBC 6.8 01/17/2022 1355   RBC 4.16 01/17/2022 1355   HGB 12.9 01/17/2022 1355   HGB 12.6 01/28/2020 0000   HCT 36.8 01/17/2022 1355   HCT 36.9 01/28/2020 0000   PLT 159.0 01/17/2022 1355   PLT 196 01/28/2020 0000   MCV 88.4 01/17/2022 1355   MCV 85 01/28/2020 0000   MCH 29.0 01/28/2020 0000   MCH 28.9 03/01/2016 0538   MCHC 35.0 01/17/2022 1355   RDW 13.2 01/17/2022 1355   RDW 15.1 01/28/2020 0000   LYMPHSABS 3.7 01/17/2022 1355   LYMPHSABS 4.0 (H)  01/28/2020 0000   MONOABS 0.6 01/17/2022 1355   EOSABS 0.1 01/17/2022 1355   EOSABS 0.1 01/28/2020 0000   BASOSABS 0.0 01/17/2022 1355   BASOSABS 0.0 01/28/2020 0000    BMET    Component Value Date/Time   NA 135 01/17/2022 1355   NA 137 08/23/2020 0000   K 4.0 01/17/2022 1355  CL 100 01/17/2022 1355   CO2 31 01/17/2022 1355   GLUCOSE 91 01/17/2022 1355   BUN 8 01/17/2022 1355   BUN 11 08/23/2020 0000   CREATININE 0.90 01/17/2022 1355   CALCIUM 9.6 01/17/2022 1355   GFRNONAA 70 01/28/2020 0000   GFRAA 81 01/28/2020 0000    BNP No results found for: "BNP"   Imaging:  No results found.        No data to display          No results found for: "NITRICOXIDE"      Assessment & Plan:   Obstructive sleep apnea syndrome She has a history of mild OSA. Difficulties tolerating CPAP due to nasal congestion. She continues to experience excessive daytime sleepiness, nocturnal apneic events, morning headaches, bruxism, drowsy driving/falling asleep while driving. History of hypertension. Given this,  I am concerned she still has sleep disordered breathing with obstructive sleep apnea. She will need repeat sleep study for further evaluation. She was urged to be very cautions when driving and to avoid driving if sleep. We also discussed taking a 20 min nap from to the hours of 10-11:30 am, which seems to be the worst time of day for her.   If revealing for OSA, we will restart her on CPAP with alternative mask, likely DreamWear full face or another option that doesn't cover the nose. Once sleep apnea is well-controlled, we could consider pharmacological therapy if hypersomnia persists.    - discussed how weight can impact sleep and risk for sleep disordered breathing - discussed options to assist with weight loss: combination of diet modification, cardiovascular and strength training exercises   - had an extensive discussion regarding the adverse health consequences related to  untreated sleep disordered breathing - specifically discussed the risks for hypertension, coronary artery disease, cardiac dysrhythmias, cerebrovascular disease, and diabetes - lifestyle modification discussed   - discussed how sleep disruption can increase risk of accidents, particularly when driving - safe driving practices were discussed  Patient Instructions  Given your symptoms, I am concerned that you still have untreated sleep apnea. You will need an urgent in lab study for further evaluation. Someone will contact you to schedule this.  We discussed how untreated sleep apnea puts an individual at risk for cardiac arrhthymias, pulm HTN, DM, stroke and increases their risk for daytime accidents. We also briefly reviewed treatment options including weight loss, side sleeping position, oral appliance, CPAP therapy or referral to ENT for possible surgical options  Use extreme caution when driving. Try to schedule a 20 minute nap around the time you get tired.   Follow up in 4 weeks with Dr. Ander Slade (new pt 30 min slot) to discuss sleep study results. If symptoms worsen, please contact office for sooner follow up or seek emergency care.    Insomnia Chronic insomnia. Likely multifactorial related to mental health conditions, chronic hip pain, fibromyalgia, and possibly untreated OSA. She is currently taking Ambien. Has tried trazodone and Lunesta in the past. See above plan.  Hypersomnia I believe most of her hypersomnia comes from poor quality sleep, multiple co-morbidities, and possibly untreated OSA. We reviewed sleep hygiene today. Lower suspicion for narcolepsy; although, we may need to consider MSLT if above workup is unrevealing or she continues to have difficulties despite appropriate therapies.    I spent 45 minutes of dedicated to the care of this patient on the date of this encounter to include pre-visit review of records, face-to-face time with the patient discussing conditions  above, post visit ordering of testing, clinical documentation with the electronic health record, making appropriate referrals as documented, and communicating necessary findings to members of the patients care team.  Clayton Bibles, NP 02/12/2022  Pt aware and understands NP's role.

## 2022-02-13 DIAGNOSIS — M6281 Muscle weakness (generalized): Secondary | ICD-10-CM | POA: Diagnosis not present

## 2022-02-13 DIAGNOSIS — K5900 Constipation, unspecified: Secondary | ICD-10-CM | POA: Diagnosis not present

## 2022-02-13 DIAGNOSIS — N393 Stress incontinence (female) (male): Secondary | ICD-10-CM | POA: Diagnosis not present

## 2022-02-14 ENCOUNTER — Telehealth: Payer: Self-pay | Admitting: Family Medicine

## 2022-02-14 NOTE — Telephone Encounter (Signed)
Patient called in to check on rheum referral and spoke with [facility]. Nelva Bush informed patient that I sent referral over but will also send another one to ensure they get it. Nelva Bush also informed patient that I would contact provider office to make sure they received it.   Patient then requested for fibromyalgia to be taken out of referral due to Dr. Redgie Grayer not seeing patients for that diagnosis.   Please advise.

## 2022-02-15 DIAGNOSIS — M9905 Segmental and somatic dysfunction of pelvic region: Secondary | ICD-10-CM | POA: Diagnosis not present

## 2022-02-15 DIAGNOSIS — M4607 Spinal enthesopathy, lumbosacral region: Secondary | ICD-10-CM | POA: Diagnosis not present

## 2022-02-15 DIAGNOSIS — M9902 Segmental and somatic dysfunction of thoracic region: Secondary | ICD-10-CM | POA: Diagnosis not present

## 2022-02-15 DIAGNOSIS — M62830 Muscle spasm of back: Secondary | ICD-10-CM | POA: Diagnosis not present

## 2022-02-15 NOTE — Telephone Encounter (Signed)
Ok

## 2022-02-18 ENCOUNTER — Encounter: Payer: Self-pay | Admitting: Family Medicine

## 2022-02-20 DIAGNOSIS — S5421XA Injury of radial nerve at forearm level, right arm, initial encounter: Secondary | ICD-10-CM | POA: Diagnosis not present

## 2022-02-21 DIAGNOSIS — M9905 Segmental and somatic dysfunction of pelvic region: Secondary | ICD-10-CM | POA: Diagnosis not present

## 2022-02-21 DIAGNOSIS — M62830 Muscle spasm of back: Secondary | ICD-10-CM | POA: Diagnosis not present

## 2022-02-21 DIAGNOSIS — M9902 Segmental and somatic dysfunction of thoracic region: Secondary | ICD-10-CM | POA: Diagnosis not present

## 2022-02-21 DIAGNOSIS — M4607 Spinal enthesopathy, lumbosacral region: Secondary | ICD-10-CM | POA: Diagnosis not present

## 2022-02-22 ENCOUNTER — Telehealth: Payer: Self-pay | Admitting: Family Medicine

## 2022-02-22 DIAGNOSIS — F331 Major depressive disorder, recurrent, moderate: Secondary | ICD-10-CM | POA: Diagnosis not present

## 2022-02-22 NOTE — Telephone Encounter (Signed)
Pt states provider says they have not received referral [phone]

## 2022-02-23 NOTE — Telephone Encounter (Signed)
Pt called very upset and says she is tired of waiting for the referral and said she is physically coming here to pick it up herself.   Pt stated to make sure the referral is ready for pick up when she gets here.   Re:  Fibromyalgia

## 2022-02-23 NOTE — Telephone Encounter (Signed)
Contact patient. Explain to patient that our coordinator has faxed it several times and give the place a call. They were out of office and left a message.  Patient was not understanding and states she will pick the document on Monday.  The document is placed in the patient-pick up file cabinet.

## 2022-02-27 ENCOUNTER — Ambulatory Visit (HOSPITAL_BASED_OUTPATIENT_CLINIC_OR_DEPARTMENT_OTHER): Payer: BC Managed Care – PPO | Attending: Nurse Practitioner | Admitting: Pulmonary Disease

## 2022-02-27 DIAGNOSIS — M4607 Spinal enthesopathy, lumbosacral region: Secondary | ICD-10-CM | POA: Diagnosis not present

## 2022-02-27 DIAGNOSIS — R519 Headache, unspecified: Secondary | ICD-10-CM | POA: Diagnosis not present

## 2022-02-27 DIAGNOSIS — G4733 Obstructive sleep apnea (adult) (pediatric): Secondary | ICD-10-CM

## 2022-02-27 DIAGNOSIS — R0683 Snoring: Secondary | ICD-10-CM | POA: Diagnosis not present

## 2022-02-27 DIAGNOSIS — I10 Essential (primary) hypertension: Secondary | ICD-10-CM | POA: Insufficient documentation

## 2022-02-27 DIAGNOSIS — R5383 Other fatigue: Secondary | ICD-10-CM | POA: Diagnosis not present

## 2022-02-27 DIAGNOSIS — M9902 Segmental and somatic dysfunction of thoracic region: Secondary | ICD-10-CM | POA: Diagnosis not present

## 2022-02-27 DIAGNOSIS — M62830 Muscle spasm of back: Secondary | ICD-10-CM | POA: Diagnosis not present

## 2022-02-27 DIAGNOSIS — K5900 Constipation, unspecified: Secondary | ICD-10-CM | POA: Diagnosis not present

## 2022-02-27 DIAGNOSIS — F513 Sleepwalking [somnambulism]: Secondary | ICD-10-CM | POA: Diagnosis not present

## 2022-02-27 DIAGNOSIS — G4750 Parasomnia, unspecified: Secondary | ICD-10-CM | POA: Insufficient documentation

## 2022-02-27 DIAGNOSIS — M6281 Muscle weakness (generalized): Secondary | ICD-10-CM | POA: Diagnosis not present

## 2022-02-27 DIAGNOSIS — M9905 Segmental and somatic dysfunction of pelvic region: Secondary | ICD-10-CM | POA: Diagnosis not present

## 2022-02-27 DIAGNOSIS — N393 Stress incontinence (female) (male): Secondary | ICD-10-CM | POA: Diagnosis not present

## 2022-02-28 DIAGNOSIS — M47816 Spondylosis without myelopathy or radiculopathy, lumbar region: Secondary | ICD-10-CM | POA: Diagnosis not present

## 2022-03-02 DIAGNOSIS — M6281 Muscle weakness (generalized): Secondary | ICD-10-CM | POA: Diagnosis not present

## 2022-03-02 DIAGNOSIS — M25551 Pain in right hip: Secondary | ICD-10-CM | POA: Diagnosis not present

## 2022-03-02 DIAGNOSIS — M25552 Pain in left hip: Secondary | ICD-10-CM | POA: Diagnosis not present

## 2022-03-02 DIAGNOSIS — M5459 Other low back pain: Secondary | ICD-10-CM | POA: Diagnosis not present

## 2022-03-06 DIAGNOSIS — M47816 Spondylosis without myelopathy or radiculopathy, lumbar region: Secondary | ICD-10-CM | POA: Diagnosis not present

## 2022-03-07 ENCOUNTER — Telehealth: Payer: Self-pay | Admitting: Pulmonary Disease

## 2022-03-07 NOTE — Telephone Encounter (Signed)
Called the pt and there was no answer- LMTCB    

## 2022-03-07 NOTE — Procedures (Signed)
POLYSOMNOGRAPHY  Last, First: Abigail, Gomez MRN: [id number] Gender: Female Age (years): 56 Weight (lbs): 165 DOB: Feb 15, 1966 BMI: 25 Primary Care: Sheliah Hatch Epworth Score: 4 Referring: Noemi Chapel NP Technician: Shelah Lewandowsky Interpreting: Tomma Lightning MD Study Type: NPSG Ordered Study Type: Split Night CPAP Study date: 02/27/2022 Location: [place] CLINICAL INFORMATION Abigail Gomez is a 56 year old Female and was referred to the sleep center for evaluation of G47.33 OSA: Adult and Pediatric (327.23). Indications include Fatigue, Hypertension, Morning Headaches, OSA, Sleep walking/talking/parasomnias.   Most recent polysomnogram dated 10/12/2015 revealed an AHI of 7.7/h. MEDICATIONS Patient self administered medications include: AMBIEN, AMITRIPTYLINE, CYCLOBENZAPRINE HCL, XANAX. Medications administered during study include No sleep medicine administered.  SLEEP STUDY TECHNIQUE A multi-channel overnight Polysomnography study was performed. The channels recorded and monitored were central and occipital EEG, electrooculogram (EOG), submentalis EMG (chin), nasal and oral airflow, thoracic and abdominal wall motion, anterior tibialis EMG, snore microphone, electrocardiogram, and a pulse oximetry. TECHNICIAN COMMENTS Comments added by Technician: NONE Comments added by Scorer: N/A SLEEP ARCHITECTURE The study was initiated at 11:01:08 PM and terminated at 5:04:07 AM. The total recorded time was 363 minutes. EEG confirmed total sleep time was 334.5 minutes yielding a sleep efficiency of 92.2%. Sleep onset after lights out was 26.3 minutes with a REM latency of 160.5 minutes. The patient spent 5.1% of the night in stage N1 sleep, 85.5% in stage N2 sleep, 0.0% in stage N3 and 9.4% in REM. Wake after sleep onset (WASO) was 2.1 minutes. The Arousal Index was 6.3/hour. RESPIRATORY PARAMETERS There were a total of 1 respiratory disturbances out of which 0 were apneas ( 0  obstructive, 0 mixed, 0 central) and 1 hypopneas. The apnea/hypopnea index (AHI) was 0.2 events/hour. The central sleep apnea index was 0 events/hour. The REM AHI was 0.0 events/hour and NREM AHI was 0.2 events/hour. The supine AHI was 0.0 events/hour and the non supine AHI was 0.2 supine during 16.14% of sleep. Respiratory disturbances were associated with oxygen desaturation down to a nadir of 92.0% during sleep. The mean oxygen saturation during the study was 95.9%.   LEG MOVEMENT DATA The total leg movements were 0 with a resulting leg movement index of 0.0/hr .Associated arousal with leg movement index was 0.0/hr.  CARDIAC DATA The underlying cardiac rhythm was most consistent with sinus rhythm. Mean heart rate during sleep was 62.7 bpm. Additional rhythm abnormalities include None.  IMPRESSIONS - No Significant Obstructive Sleep apnea(OSA) - EKG showed no cardiac abnormalities. - No significant Oxygen Desaturation - The patient snored with soft snoring volume. - Normal sleep efficiency - No significant periodic leg movements(PLMs) during sleep. However, no significant associated arousals.  DIAGNOSIS - Primary snoring - Past history of mild sleep Apnea  RECOMMENDATIONS - Avoid alcohol, sedatives and other CNS depressants that may worsen sleep apnea and disrupt normal sleep architecture. - Sleep hygiene should be reviewed to assess factors that may improve sleep quality. - Weight management and regular exercise should be initiated or continued.  [Electronically signed] 03/07/2022 06:15 AM  Virl Diamond MD NPI: [id number]

## 2022-03-07 NOTE — Telephone Encounter (Signed)
I called and spoke with the pt and notified of results/recs per Dr Wynona Neat. She wanted appt to discuss further, as she disagrees with results. I have scheduled her appt with Katie for 03/22/22.

## 2022-03-07 NOTE — Telephone Encounter (Signed)
Call patient  Sleep study result  Date of study: 02/27/2022  Impression: Negative study for significant sleep disordered breathing Past history of mild sleep apnea  Recommendation:  Optimize sleep hygiene-getting 6 to 8 hours of sleep is optimal  Avoid alcohol, sedatives and other CNS depressants that may worsen sleep quality  Optimize management for mood disorder  Weight management and regular exercise

## 2022-03-13 DIAGNOSIS — N3946 Mixed incontinence: Secondary | ICD-10-CM | POA: Diagnosis not present

## 2022-03-14 DIAGNOSIS — F332 Major depressive disorder, recurrent severe without psychotic features: Secondary | ICD-10-CM | POA: Diagnosis not present

## 2022-03-15 DIAGNOSIS — F332 Major depressive disorder, recurrent severe without psychotic features: Secondary | ICD-10-CM | POA: Diagnosis not present

## 2022-03-16 DIAGNOSIS — F332 Major depressive disorder, recurrent severe without psychotic features: Secondary | ICD-10-CM | POA: Diagnosis not present

## 2022-03-19 DIAGNOSIS — F332 Major depressive disorder, recurrent severe without psychotic features: Secondary | ICD-10-CM | POA: Diagnosis not present

## 2022-03-19 DIAGNOSIS — S5421XA Injury of radial nerve at forearm level, right arm, initial encounter: Secondary | ICD-10-CM | POA: Diagnosis not present

## 2022-03-19 DIAGNOSIS — M25531 Pain in right wrist: Secondary | ICD-10-CM | POA: Diagnosis not present

## 2022-03-20 DIAGNOSIS — F332 Major depressive disorder, recurrent severe without psychotic features: Secondary | ICD-10-CM | POA: Diagnosis not present

## 2022-03-21 DIAGNOSIS — F332 Major depressive disorder, recurrent severe without psychotic features: Secondary | ICD-10-CM | POA: Diagnosis not present

## 2022-03-21 DIAGNOSIS — M9905 Segmental and somatic dysfunction of pelvic region: Secondary | ICD-10-CM | POA: Diagnosis not present

## 2022-03-21 DIAGNOSIS — M62830 Muscle spasm of back: Secondary | ICD-10-CM | POA: Diagnosis not present

## 2022-03-21 DIAGNOSIS — M4607 Spinal enthesopathy, lumbosacral region: Secondary | ICD-10-CM | POA: Diagnosis not present

## 2022-03-21 DIAGNOSIS — M9902 Segmental and somatic dysfunction of thoracic region: Secondary | ICD-10-CM | POA: Diagnosis not present

## 2022-03-22 ENCOUNTER — Ambulatory Visit: Payer: BC Managed Care – PPO | Admitting: Nurse Practitioner

## 2022-03-22 ENCOUNTER — Ambulatory Visit: Payer: BC Managed Care – PPO | Admitting: Pulmonary Disease

## 2022-03-22 DIAGNOSIS — F332 Major depressive disorder, recurrent severe without psychotic features: Secondary | ICD-10-CM | POA: Diagnosis not present

## 2022-03-23 DIAGNOSIS — N3946 Mixed incontinence: Secondary | ICD-10-CM | POA: Diagnosis not present

## 2022-03-26 DIAGNOSIS — F332 Major depressive disorder, recurrent severe without psychotic features: Secondary | ICD-10-CM | POA: Diagnosis not present

## 2022-03-28 DIAGNOSIS — M25552 Pain in left hip: Secondary | ICD-10-CM | POA: Diagnosis not present

## 2022-03-28 DIAGNOSIS — M6281 Muscle weakness (generalized): Secondary | ICD-10-CM | POA: Diagnosis not present

## 2022-03-28 DIAGNOSIS — M5459 Other low back pain: Secondary | ICD-10-CM | POA: Diagnosis not present

## 2022-03-28 DIAGNOSIS — F332 Major depressive disorder, recurrent severe without psychotic features: Secondary | ICD-10-CM | POA: Diagnosis not present

## 2022-03-28 DIAGNOSIS — M25551 Pain in right hip: Secondary | ICD-10-CM | POA: Diagnosis not present

## 2022-03-29 DIAGNOSIS — F332 Major depressive disorder, recurrent severe without psychotic features: Secondary | ICD-10-CM | POA: Diagnosis not present

## 2022-03-30 DIAGNOSIS — R350 Frequency of micturition: Secondary | ICD-10-CM | POA: Diagnosis not present

## 2022-03-30 DIAGNOSIS — R3915 Urgency of urination: Secondary | ICD-10-CM | POA: Diagnosis not present

## 2022-04-02 DIAGNOSIS — F332 Major depressive disorder, recurrent severe without psychotic features: Secondary | ICD-10-CM | POA: Diagnosis not present

## 2022-04-02 DIAGNOSIS — M5459 Other low back pain: Secondary | ICD-10-CM | POA: Diagnosis not present

## 2022-04-02 DIAGNOSIS — M6281 Muscle weakness (generalized): Secondary | ICD-10-CM | POA: Diagnosis not present

## 2022-04-02 DIAGNOSIS — M25552 Pain in left hip: Secondary | ICD-10-CM | POA: Diagnosis not present

## 2022-04-02 DIAGNOSIS — M25551 Pain in right hip: Secondary | ICD-10-CM | POA: Diagnosis not present

## 2022-04-03 ENCOUNTER — Telehealth (INDEPENDENT_AMBULATORY_CARE_PROVIDER_SITE_OTHER): Payer: BC Managed Care – PPO | Admitting: Nurse Practitioner

## 2022-04-03 ENCOUNTER — Encounter: Payer: Self-pay | Admitting: Nurse Practitioner

## 2022-04-03 VITALS — Ht 68.0 in

## 2022-04-03 DIAGNOSIS — F5101 Primary insomnia: Secondary | ICD-10-CM | POA: Diagnosis not present

## 2022-04-03 DIAGNOSIS — F332 Major depressive disorder, recurrent severe without psychotic features: Secondary | ICD-10-CM | POA: Diagnosis not present

## 2022-04-03 DIAGNOSIS — G4710 Hypersomnia, unspecified: Secondary | ICD-10-CM | POA: Diagnosis not present

## 2022-04-03 NOTE — Progress Notes (Unsigned)
Patient ID: Abigail Gomez, female     DOB: 01/29/1966, 57 y.o.      MRN: [id number]  Chief Complaint  Patient presents with   Follow-up    Pt f/u sleep study results    Virtual Visit via Video Note  I connected with [name] on 04/05/22 at  3:30 PM EST by a video enabled telemedicine application and verified that I am speaking with the correct person using two identifiers.  Location: Patient: Home Provider: Office   I discussed the limitations of evaluation and management by telemedicine and the availability of in person appointments. The patient expressed understanding and agreed to proceed.  History of Present Illness: 57 year old female, never smoker referred for sleep consult. Past medical history significant for migraines, HTN, mild OSA previously on OSA, GERD, hypothyroid, OA of hip, insomnia, hypersomnia, anxiety and depression, fibromyalgia, RLS, sickle cell trait, s/p bunionectomy, HLD   TEST/EVENTS: 02/27/2022 split night study: No evidence of OSA; AHI 0/h; SpO2 low 92%  02/12/2022: Today - sleep consult Patient presents today for sleep consult. She was previously seen by Dr. Christene Slates in 2017 for sleep apnea. Underwent HST which revealed mild OSA with AHI 7.7/h. She was started on CPAP therapy. She had a lot of trouble with nasal congestion and recurrent sinus infections with PAP therapy. Never tried any other mask besides a nasal. She stopped using it around 1 year ago, if not longer.  She has a longstanding history of trouble with sleeping, which she believes is multifactorial. She has pain in her hips and fibromyalgia so she has trouble getting comfortable, which causes her to toss and turn throughout the night. She has trouble with sleep onset as well. Has tried numerous medications including trazodone, melatonin, lunesta and is currently on 12.5 mg of ambien, which works some nights but not others. She is exhausted throughout the day. Wakes feeling poorly rested.  Struggles to stay awake, especially with driving. She has dozed off before. 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 Finds the hours of 10-11:30 am are the hardest to stay awake. Doesn't schedule naps because she has trouble falling asleep. She has occasional morning headaches and dry mouth. She does grind her teeth at night - wears a mouth guard. She has been told she breathes very loudly at night and has even stopped breathing. Previous episodes of sleep paralysis but these are infrequent. No sudden sleep attacks. Denies sleep parasomnias, snoring. No history of narcolepsy or cataplexy.  She goes to sleep around 11 pm; takes around 2 hours to fall asleep. Wakes multiple times. Gets out of the bed around 7 am. She's down 10 lb over the last two years.  She has a history of hypertension and chronic headaches. No history of stroke or cardiac arrhythmias. She's had multiple surgical procedures over the past 20 years.  She is a never smoker. Doesn't drink alcohol. No caffeine use. She lives by herself. She has a family history of allergies, heart disease, rheumatism.  Epworth 0  04/03/2022: Today - follow up Patient presents today for discuss sleep study results, which did not reveal any significant sleep apnea. Her AHI was 0/h. She is very frustrated with these results. She does not feel as though they are correct because she had a sleep study years ago with mild OSA. She stopped wearing CPAP because of recurrent sinus infections. All she wants is to get back on therapy. Her  fatigue symptoms have caused significant strain in her life. She is concerned for her job secuirty as commonly falls asleep when driving. She does this without even realizing it. She has a lot of trouble with restless sleep at night. She is not able to stay on a consistent sleep aid due to her fibromyalgia and having to do a wash out every 3-4 days. She has tried numerous sleep aids; currently using ambien. Her  fatigue is debilitating and causing worsening depression. She is working with her psychiatrist on this. They did recently put her on Provigil, which has been helping. She feels like she can stay awake more easily but is still tired. She denies any sleep parasomnias/paralysis. No symptoms of cataplexy. She has never been evaluated for narcolepsy.   Allergies  Allergen Reactions   Peanut Oil Anaphylaxis   Penicillins Other (See Comments) and Anaphylaxis    Syncope Has patient had a PCN reaction causing immediate rash, facial/tongue/throat swelling, SOB or lightheadedness with hypotension:Yes Has patient had a PCN reaction causing severe rash involving mucus membranes or skin necrosis:No Has patient had a PCN reaction that required hospitalization:No Has patient had a PCN reaction occurring within the last 10 years:No If all of the above answers are "NO", then may proceed with Cephalosporin use.    Food     Nuts-itching/swelling Bananas-itching/swelling   Lactose Intolerance (Gi) Other (See Comments)    G.I. upset   Lactulose Other (See Comments)    G.I. upset   Lisinopril Cough   Other Itching    Nuts-itching/swelling Bananas-itching/swelling   Oxycodone-Acetaminophen Itching and Other (See Comments)   Immunization History  Administered Date(s) Administered   Hep A / Hep B 03/08/2008, 09/06/2008   Hepatitis B 04/08/2008   Hepatitis B, PED/ADOLESCENT 04/08/2008   Influenza Split 12/01/2012, 12/22/2013, 12/22/2014   Influenza,inj,Quad PF,6+ Mos 12/16/2009, 01/02/2016, 11/22/2016, 11/30/2021   Influenza,inj,quad, With Preservative 02/28/2009, 11/16/2010, 11/22/2014, 01/02/2016, 11/30/2017   Influenza-Unspecified 12/01/2012, 12/22/2013, 11/22/2014, 01/02/2016   MMR 03/08/2008, 02/28/2009   Meningococcal Conjugate 02/28/2009   Moderna Sars-Covid-2 Vaccination 01/10/2022   PPD Test 12/22/2013, 05/04/2014, 12/08/2014, 01/02/2016   Pneumococcal-Unspecified 02/28/2009   Tdap 02/27/2008    Past Medical History:  Diagnosis Date   Allergy    Anemia    Anxiety    Depression    Disease    lymes disease   Fibromyalgia    H/o Lyme disease    H/O sickle cell trait    History of pelvic fracture    History of urinary incontinence    Hypertension    Hypothyroidism    Insomnia    Labral tear of hip joint    Right Hip   Migraine    Osteoarthritis    RLS (restless legs syndrome)    Sleep apnea    cpap   Spinal stenosis    Thyroid disease    TMJ syndrome    Vitamin D deficiency     Tobacco History: Social History   Tobacco Use  Smoking Status Never  Smokeless Tobacco Never   Counseling given: Not Answered   Outpatient Medications Prior to Visit  Medication Sig Dispense Refill   ALPRAZolam (XANAX) 0.5 MG tablet Take 0.5 mg by mouth at bedtime as needed for anxiety.     amitriptyline (ELAVIL) 10 MG tablet TAKE 4 TABLETS (40 MG TOTAL) BY MOUTH AT BEDTIME. 336 tablet 29   ARMOUR THYROID 30 MG tablet TAKE 1 TABLET BY MOUTH EVERY DAY 90 tablet 1   cholecalciferol (VITAMIN D) 1000  units tablet Take 5,000 Units by mouth daily.      cyclobenzaprine (FLEXERIL) 10 MG tablet Take 1 tablet (10 mg total) by mouth 3 (three) times daily as needed for muscle spasms. 90 tablet 0   diazepam (VALIUM) 10 MG tablet Take 10 mg by mouth at bedtime. Vaginally     estradiol (CLIMARA - DOSED IN MG/24 HR) 0.1 mg/24hr patch Place 0.1 mg onto the skin 2 (two) times a week.     lidocaine (LIDODERM) 5 % PLACE 1 PATCH ONTO THE SKIN DAILY. REMOVE & DISCARD PATCH WITHIN 12 HOURS OR AS DIRECTED BY MD 90 patch 3   losartan-hydrochlorothiazide (HYZAAR) 100-25 MG tablet TAKE 1/2 TABLET BY MOUTH DAILY 45 tablet 1   Lurasidone HCl (LATUDA PO) Take 30 mg by mouth daily.     Magnesium Amino Acid Chelate 20 % POWD Take 450 mg by mouth at bedtime.     Melatonin 10 MG TABS Take 10 mg by mouth at bedtime.      metoprolol tartrate (LOPRESSOR) 50 MG tablet Take 1 tablet (50 mg total) by mouth 2 (two) times  daily. 180 tablet 3   nitrofurantoin, macrocrystal-monohydrate, (MACROBID) 100 MG capsule Take 100 mg by mouth.     rizatriptan (MAXALT-MLT) 10 MG disintegrating tablet DISSOLVE 1 TABLET BY MOUTH DAILY AS NEEDED FOR MIGRAINE 10 tablet 4   sulfamethoxazole-trimethoprim (BACTRIM DS) 800-160 MG tablet Take 1 tablet by mouth. One tablet before having intercourse.     Vibegron (GEMTESA) 75 MG TABS Take 75 mg by mouth daily.     VITAMIN B COMPLEX-C PO Take 1 capsule by mouth daily.     vortioxetine HBr (TRINTELLIX) 20 MG TABS tablet Take 20 mg by mouth daily.      zolpidem (AMBIEN CR) 6.25 MG CR tablet Take 12.5 mg by mouth at bedtime as needed for sleep.     zonisamide (ZONEGRAN) 50 MG capsule Take 150 mg by mouth at bedtime.     oxybutynin (DITROPAN-XL) 5 MG 24 hr tablet Take 1 tablet every day by oral route for 30 days.     No facility-administered medications prior to visit.     Review of Systems:   Constitutional: No weight loss or gain, night sweats, fevers, chills, or lassitude. +excessive daytime fatigue HEENT: No tooth/dental problems, or sore throat. No sneezing, itching, ear ache, or post nasal drip. +chronic swallowing difficulties, headaches, nasal congestion, sneezing CV:  No chest pain, orthopnea, PND, swelling in lower extremities, anasarca, dizziness, palpitations, syncope Resp: No shortness of breath with exertion or at rest. No excess mucus or change in color of mucus. No productive or non-productive. No hemoptysis. No wheezing.  No chest wall deformity GI:  +heartburn, indigestion (controlled with medication). No abdominal pain, nausea, vomiting, diarrhea, change in bowel habits GU: No dysuria, change in color of urine, urgency or frequency.  Skin: No rash, lesions, ulcerations MSK:  +chronic hip and back pain.  No decreased range of motion.  Neuro: No dizziness or lightheadedness.  Psych: +anxiety/depression. No SI/HI. Mood stable. +sleep  disturbance  Observations/Objective: Multiple attempts to connect to video on patient's end. Unable to get video to load. A&Ox3. Speech is clear and coherent with logical content.   Assessment and Plan: Hypersomnia Significant hypersomnia without evidence of OSA. She feels strongly that her results weren't accurate. We did discuss that she has had a 20 lb weight loss since her previous sleep study in 2017, and reviewed that this is likely the cause for improvement. She  does have persistent sleepiness, falls asleep easily during the day especially with driving, restless nighttime sleep, and improvement with Provigil. Question underlying narcolepsy? Recommended MSLT for further evaluation. We will also repeat her NPSG prior to her MSLT per her request to ensure no evidence of OSA.  She is on SSRI, which is unsafe to be held so we will complete MSLT on this. Patient understands that this may skew results. She does understand that she will have to hold Provigil prior to her study. Cautioned on safe driving practices and avoiding driving, if possible. She will continue to work with her psychiatrist on mental health and insomnia.   Patient Instructions  Sleep study without evidence of sleep apnea. This is likely due to weight loss since your last study. However, understanding your concerns, we will repeat an overnight sleep study and then follow this with a multi-sleep latency study to evaluate for narcolepsy. Someone will contact you for scheduling.  Use extreme caution when driving and pull over if you become sleepy.   Follow up after studies with Dr. Ander Slade (30 min new pt slot). If symptoms do not improve or worsen, please contact office for sooner follow up or seek emergency care.     Insomnia See above    I discussed the assessment and treatment plan with the patient. The patient was provided an opportunity to ask questions and all were answered. The patient agreed with the plan and demonstrated  an understanding of the instructions.   The patient was advised to call back or seek an in-person evaluation if the symptoms worsen or if the condition fails to improve as anticipated.  I provided 35 minutes of non-face-to-face time during this encounter.   Clayton Bibles, NP

## 2022-04-04 ENCOUNTER — Encounter: Payer: Self-pay | Admitting: Family Medicine

## 2022-04-04 ENCOUNTER — Ambulatory Visit (INDEPENDENT_AMBULATORY_CARE_PROVIDER_SITE_OTHER): Payer: BC Managed Care – PPO | Admitting: Family Medicine

## 2022-04-04 ENCOUNTER — Telehealth: Payer: Self-pay | Admitting: Nurse Practitioner

## 2022-04-04 VITALS — BP 110/80 | HR 63 | Temp 98.3°F | Wt 155.6 lb

## 2022-04-04 DIAGNOSIS — M25551 Pain in right hip: Secondary | ICD-10-CM | POA: Diagnosis not present

## 2022-04-04 DIAGNOSIS — M545 Low back pain, unspecified: Secondary | ICD-10-CM | POA: Diagnosis not present

## 2022-04-04 DIAGNOSIS — G8929 Other chronic pain: Secondary | ICD-10-CM

## 2022-04-04 DIAGNOSIS — M5459 Other low back pain: Secondary | ICD-10-CM | POA: Diagnosis not present

## 2022-04-04 DIAGNOSIS — M6281 Muscle weakness (generalized): Secondary | ICD-10-CM | POA: Diagnosis not present

## 2022-04-04 DIAGNOSIS — M25552 Pain in left hip: Secondary | ICD-10-CM | POA: Diagnosis not present

## 2022-04-04 MED ORDER — KETOROLAC TROMETHAMINE 60 MG/2ML IM SOLN
60.0000 mg | Freq: Once | INTRAMUSCULAR | Status: AC
  Administered 2022-04-04: 60 mg via INTRAMUSCULAR

## 2022-04-04 MED ORDER — METHYLPREDNISOLONE 4 MG PO TBPK: ORAL_TABLET | ORAL | 0 refills | Status: DC

## 2022-04-04 NOTE — Addendum Note (Signed)
Addended by: Wyvonne Lenz on: 04/04/2022 02:58 PM   Modules accepted: Orders

## 2022-04-04 NOTE — Progress Notes (Signed)
   Subjective:    Patient ID: Abigail Gomez, female    DOB: 1965/08/01, 57 y.o.   MRN: [id number]  HPI Here asking for some help with her chronic back pain. She has lumbar stenosis, and she sees Dr. Quillian Quince Gomez at the [facility]. She has had several injections to the spine with little relief. Her pain is centered in the lower back with no radiation to the legs. She asks of a shot of Toradol and a prednisone pack.    Review of Systems  Constitutional: Negative.   Respiratory: Negative.    Cardiovascular: Negative.   Musculoskeletal:  Positive for back pain.       Objective:   Physical Exam Constitutional:      Appearance: Normal appearance.  Cardiovascular:     Rate and Rhythm: Normal rate and regular rhythm.     Pulses: Normal pulses.     Heart sounds: Normal heart sounds.  Pulmonary:     Effort: Pulmonary effort is normal.     Breath sounds: Normal breath sounds.  Neurological:     Mental Status: She is alert.           Assessment & Plan:  Low back pain. She is given a shot of Toradol and a Medrol dose pack. She will follow up with Dr. Burton Gomez.  Abigail Penna, MD

## 2022-04-04 NOTE — Telephone Encounter (Signed)
Denied case for MLST with an optional P2P call:   CASE ID: [id number]   PHONE NUMBER: [phone]  press 1, press 2, then press 2 again for nurse reviewer   Placido Sou is holding the case for the next 24hours for clinicals faxed and P2P call if made before then.

## 2022-04-05 ENCOUNTER — Encounter: Payer: Self-pay | Admitting: Nurse Practitioner

## 2022-04-05 NOTE — Telephone Encounter (Signed)
Case approved, P2P completed  Closing encounter

## 2022-04-05 NOTE — Assessment & Plan Note (Signed)
See above

## 2022-04-05 NOTE — Assessment & Plan Note (Signed)
Significant hypersomnia without evidence of OSA. She feels strongly that her results weren't accurate. We did discuss that she has had a 20 lb weight loss since her previous sleep study in 2017, and reviewed that this is likely the cause for improvement. She does have persistent sleepiness, falls asleep easily during the day especially with driving, restless nighttime sleep, and improvement with Provigil. Question underlying narcolepsy? Recommended MSLT for further evaluation. We will also repeat her NPSG prior to her MSLT per her request to ensure no evidence of OSA.  She is on SSRI, which is unsafe to be held so we will complete MSLT on this. Patient understands that this may skew results. She does understand that she will have to hold Provigil prior to her study. Cautioned on safe driving practices and avoiding driving, if possible. She will continue to work with her psychiatrist on mental health and insomnia.   Patient Instructions  Sleep study without evidence of sleep apnea. This is likely due to weight loss since your last study. However, understanding your concerns, we will repeat an overnight sleep study and then follow this with a multi-sleep latency study to evaluate for narcolepsy. Someone will contact you for scheduling.  Use extreme caution when driving and pull over if you become sleepy.   Follow up after studies with Dr. Ander Slade (30 min new pt slot). If symptoms do not improve or worsen, please contact office for sooner follow up or seek emergency care.

## 2022-04-05 NOTE — Patient Instructions (Signed)
Sleep study without evidence of sleep apnea. This is likely due to weight loss since your last study. However, understanding your concerns, we will repeat an overnight sleep study and then follow this with a multi-sleep latency study to evaluate for narcolepsy. Someone will contact you for scheduling.  Use extreme caution when driving and pull over if you become sleepy.   Follow up after studies with Dr. Ander Slade (30 min new pt slot). If symptoms do not improve or worsen, please contact office for sooner follow up or seek emergency care.

## 2022-04-06 DIAGNOSIS — F332 Major depressive disorder, recurrent severe without psychotic features: Secondary | ICD-10-CM | POA: Diagnosis not present

## 2022-04-09 DIAGNOSIS — F332 Major depressive disorder, recurrent severe without psychotic features: Secondary | ICD-10-CM | POA: Diagnosis not present

## 2022-04-10 DIAGNOSIS — F332 Major depressive disorder, recurrent severe without psychotic features: Secondary | ICD-10-CM | POA: Diagnosis not present

## 2022-04-11 DIAGNOSIS — M62830 Muscle spasm of back: Secondary | ICD-10-CM | POA: Diagnosis not present

## 2022-04-11 DIAGNOSIS — M4607 Spinal enthesopathy, lumbosacral region: Secondary | ICD-10-CM | POA: Diagnosis not present

## 2022-04-11 DIAGNOSIS — F332 Major depressive disorder, recurrent severe without psychotic features: Secondary | ICD-10-CM | POA: Diagnosis not present

## 2022-04-11 DIAGNOSIS — M9905 Segmental and somatic dysfunction of pelvic region: Secondary | ICD-10-CM | POA: Diagnosis not present

## 2022-04-11 DIAGNOSIS — M9902 Segmental and somatic dysfunction of thoracic region: Secondary | ICD-10-CM | POA: Diagnosis not present

## 2022-04-12 ENCOUNTER — Other Ambulatory Visit: Payer: Self-pay | Admitting: Family Medicine

## 2022-04-12 DIAGNOSIS — M25551 Pain in right hip: Secondary | ICD-10-CM | POA: Diagnosis not present

## 2022-04-12 DIAGNOSIS — F332 Major depressive disorder, recurrent severe without psychotic features: Secondary | ICD-10-CM | POA: Diagnosis not present

## 2022-04-12 DIAGNOSIS — M25552 Pain in left hip: Secondary | ICD-10-CM | POA: Diagnosis not present

## 2022-04-12 DIAGNOSIS — M6281 Muscle weakness (generalized): Secondary | ICD-10-CM | POA: Diagnosis not present

## 2022-04-12 DIAGNOSIS — M5459 Other low back pain: Secondary | ICD-10-CM | POA: Diagnosis not present

## 2022-04-13 DIAGNOSIS — F332 Major depressive disorder, recurrent severe without psychotic features: Secondary | ICD-10-CM | POA: Diagnosis not present

## 2022-04-16 DIAGNOSIS — M5459 Other low back pain: Secondary | ICD-10-CM | POA: Diagnosis not present

## 2022-04-16 DIAGNOSIS — F332 Major depressive disorder, recurrent severe without psychotic features: Secondary | ICD-10-CM | POA: Diagnosis not present

## 2022-04-16 DIAGNOSIS — M25552 Pain in left hip: Secondary | ICD-10-CM | POA: Diagnosis not present

## 2022-04-16 DIAGNOSIS — M6281 Muscle weakness (generalized): Secondary | ICD-10-CM | POA: Diagnosis not present

## 2022-04-16 DIAGNOSIS — M25551 Pain in right hip: Secondary | ICD-10-CM | POA: Diagnosis not present

## 2022-04-17 DIAGNOSIS — N3281 Overactive bladder: Secondary | ICD-10-CM | POA: Diagnosis not present

## 2022-04-17 DIAGNOSIS — N393 Stress incontinence (female) (male): Secondary | ICD-10-CM | POA: Diagnosis not present

## 2022-04-18 DIAGNOSIS — M62830 Muscle spasm of back: Secondary | ICD-10-CM | POA: Diagnosis not present

## 2022-04-18 DIAGNOSIS — F332 Major depressive disorder, recurrent severe without psychotic features: Secondary | ICD-10-CM | POA: Diagnosis not present

## 2022-04-18 DIAGNOSIS — M25552 Pain in left hip: Secondary | ICD-10-CM | POA: Diagnosis not present

## 2022-04-18 DIAGNOSIS — M9902 Segmental and somatic dysfunction of thoracic region: Secondary | ICD-10-CM | POA: Diagnosis not present

## 2022-04-18 DIAGNOSIS — M5459 Other low back pain: Secondary | ICD-10-CM | POA: Diagnosis not present

## 2022-04-18 DIAGNOSIS — M4607 Spinal enthesopathy, lumbosacral region: Secondary | ICD-10-CM | POA: Diagnosis not present

## 2022-04-18 DIAGNOSIS — M6281 Muscle weakness (generalized): Secondary | ICD-10-CM | POA: Diagnosis not present

## 2022-04-18 DIAGNOSIS — M25551 Pain in right hip: Secondary | ICD-10-CM | POA: Diagnosis not present

## 2022-04-18 DIAGNOSIS — M9905 Segmental and somatic dysfunction of pelvic region: Secondary | ICD-10-CM | POA: Diagnosis not present

## 2022-04-19 DIAGNOSIS — M6281 Muscle weakness (generalized): Secondary | ICD-10-CM | POA: Diagnosis not present

## 2022-04-19 DIAGNOSIS — M25552 Pain in left hip: Secondary | ICD-10-CM | POA: Diagnosis not present

## 2022-04-19 DIAGNOSIS — M25551 Pain in right hip: Secondary | ICD-10-CM | POA: Diagnosis not present

## 2022-04-19 DIAGNOSIS — F332 Major depressive disorder, recurrent severe without psychotic features: Secondary | ICD-10-CM | POA: Diagnosis not present

## 2022-04-19 DIAGNOSIS — M5459 Other low back pain: Secondary | ICD-10-CM | POA: Diagnosis not present

## 2022-04-20 ENCOUNTER — Ambulatory Visit: Payer: BC Managed Care – PPO | Admitting: Obstetrics and Gynecology

## 2022-04-20 DIAGNOSIS — F332 Major depressive disorder, recurrent severe without psychotic features: Secondary | ICD-10-CM | POA: Diagnosis not present

## 2022-04-23 DIAGNOSIS — M25552 Pain in left hip: Secondary | ICD-10-CM | POA: Diagnosis not present

## 2022-04-23 DIAGNOSIS — M25551 Pain in right hip: Secondary | ICD-10-CM | POA: Diagnosis not present

## 2022-04-23 DIAGNOSIS — M6281 Muscle weakness (generalized): Secondary | ICD-10-CM | POA: Diagnosis not present

## 2022-04-23 DIAGNOSIS — M5459 Other low back pain: Secondary | ICD-10-CM | POA: Diagnosis not present

## 2022-04-24 DIAGNOSIS — F332 Major depressive disorder, recurrent severe without psychotic features: Secondary | ICD-10-CM | POA: Diagnosis not present

## 2022-04-25 DIAGNOSIS — M25552 Pain in left hip: Secondary | ICD-10-CM | POA: Diagnosis not present

## 2022-04-25 DIAGNOSIS — M6281 Muscle weakness (generalized): Secondary | ICD-10-CM | POA: Diagnosis not present

## 2022-04-25 DIAGNOSIS — M5459 Other low back pain: Secondary | ICD-10-CM | POA: Diagnosis not present

## 2022-04-25 DIAGNOSIS — M25551 Pain in right hip: Secondary | ICD-10-CM | POA: Diagnosis not present

## 2022-04-26 DIAGNOSIS — M797 Fibromyalgia: Secondary | ICD-10-CM | POA: Diagnosis not present

## 2022-04-27 DIAGNOSIS — F332 Major depressive disorder, recurrent severe without psychotic features: Secondary | ICD-10-CM | POA: Diagnosis not present

## 2022-04-30 ENCOUNTER — Encounter: Payer: Self-pay | Admitting: *Deleted

## 2022-04-30 ENCOUNTER — Encounter (HOSPITAL_BASED_OUTPATIENT_CLINIC_OR_DEPARTMENT_OTHER): Payer: BC Managed Care – PPO | Admitting: Pulmonary Disease

## 2022-04-30 DIAGNOSIS — M5459 Other low back pain: Secondary | ICD-10-CM | POA: Diagnosis not present

## 2022-04-30 DIAGNOSIS — M6281 Muscle weakness (generalized): Secondary | ICD-10-CM | POA: Diagnosis not present

## 2022-04-30 DIAGNOSIS — M25551 Pain in right hip: Secondary | ICD-10-CM | POA: Diagnosis not present

## 2022-04-30 DIAGNOSIS — M25552 Pain in left hip: Secondary | ICD-10-CM | POA: Diagnosis not present

## 2022-04-30 DIAGNOSIS — F332 Major depressive disorder, recurrent severe without psychotic features: Secondary | ICD-10-CM | POA: Diagnosis not present

## 2022-05-01 ENCOUNTER — Encounter (HOSPITAL_BASED_OUTPATIENT_CLINIC_OR_DEPARTMENT_OTHER): Payer: BC Managed Care – PPO | Admitting: Pulmonary Disease

## 2022-05-01 DIAGNOSIS — F332 Major depressive disorder, recurrent severe without psychotic features: Secondary | ICD-10-CM | POA: Diagnosis not present

## 2022-05-02 DIAGNOSIS — M25552 Pain in left hip: Secondary | ICD-10-CM | POA: Diagnosis not present

## 2022-05-02 DIAGNOSIS — M5459 Other low back pain: Secondary | ICD-10-CM | POA: Diagnosis not present

## 2022-05-02 DIAGNOSIS — F332 Major depressive disorder, recurrent severe without psychotic features: Secondary | ICD-10-CM | POA: Diagnosis not present

## 2022-05-02 DIAGNOSIS — M25551 Pain in right hip: Secondary | ICD-10-CM | POA: Diagnosis not present

## 2022-05-02 DIAGNOSIS — M6281 Muscle weakness (generalized): Secondary | ICD-10-CM | POA: Diagnosis not present

## 2022-05-04 DIAGNOSIS — F332 Major depressive disorder, recurrent severe without psychotic features: Secondary | ICD-10-CM | POA: Diagnosis not present

## 2022-05-08 DIAGNOSIS — F332 Major depressive disorder, recurrent severe without psychotic features: Secondary | ICD-10-CM | POA: Diagnosis not present

## 2022-05-09 DIAGNOSIS — F332 Major depressive disorder, recurrent severe without psychotic features: Secondary | ICD-10-CM | POA: Diagnosis not present

## 2022-05-09 DIAGNOSIS — M25552 Pain in left hip: Secondary | ICD-10-CM | POA: Diagnosis not present

## 2022-05-09 DIAGNOSIS — M5459 Other low back pain: Secondary | ICD-10-CM | POA: Diagnosis not present

## 2022-05-09 DIAGNOSIS — M6281 Muscle weakness (generalized): Secondary | ICD-10-CM | POA: Diagnosis not present

## 2022-05-09 DIAGNOSIS — M25551 Pain in right hip: Secondary | ICD-10-CM | POA: Diagnosis not present

## 2022-05-10 DIAGNOSIS — F332 Major depressive disorder, recurrent severe without psychotic features: Secondary | ICD-10-CM | POA: Diagnosis not present

## 2022-05-11 DIAGNOSIS — F332 Major depressive disorder, recurrent severe without psychotic features: Secondary | ICD-10-CM | POA: Diagnosis not present

## 2022-05-15 DIAGNOSIS — F331 Major depressive disorder, recurrent, moderate: Secondary | ICD-10-CM | POA: Diagnosis not present

## 2022-05-16 ENCOUNTER — Telehealth: Payer: Self-pay | Admitting: Family Medicine

## 2022-05-16 DIAGNOSIS — M5416 Radiculopathy, lumbar region: Secondary | ICD-10-CM | POA: Diagnosis not present

## 2022-05-16 DIAGNOSIS — G8929 Other chronic pain: Secondary | ICD-10-CM | POA: Diagnosis not present

## 2022-05-16 DIAGNOSIS — M533 Sacrococcygeal disorders, not elsewhere classified: Secondary | ICD-10-CM | POA: Diagnosis not present

## 2022-05-16 NOTE — Telephone Encounter (Signed)
Patient dropped off document FMLA, to be filled out by provider. Patient requested to Call Patient to pick up  ASAP it is due by March 13th. Pt is aware of 5-7 business day turn around. Document is located in providers tray at front office.Please advise at Mobile [phone] (mobile)   Please advise

## 2022-05-17 DIAGNOSIS — M6281 Muscle weakness (generalized): Secondary | ICD-10-CM | POA: Diagnosis not present

## 2022-05-17 DIAGNOSIS — M25552 Pain in left hip: Secondary | ICD-10-CM | POA: Diagnosis not present

## 2022-05-17 DIAGNOSIS — M5459 Other low back pain: Secondary | ICD-10-CM | POA: Diagnosis not present

## 2022-05-17 DIAGNOSIS — M25551 Pain in right hip: Secondary | ICD-10-CM | POA: Diagnosis not present

## 2022-05-21 DIAGNOSIS — Z0279 Encounter for issue of other medical certificate: Secondary | ICD-10-CM

## 2022-05-23 DIAGNOSIS — M25551 Pain in right hip: Secondary | ICD-10-CM | POA: Diagnosis not present

## 2022-05-23 DIAGNOSIS — M25552 Pain in left hip: Secondary | ICD-10-CM | POA: Diagnosis not present

## 2022-05-23 DIAGNOSIS — M5459 Other low back pain: Secondary | ICD-10-CM | POA: Diagnosis not present

## 2022-05-23 DIAGNOSIS — M6281 Muscle weakness (generalized): Secondary | ICD-10-CM | POA: Diagnosis not present

## 2022-05-23 NOTE — Telephone Encounter (Signed)
Pt is calling back and stated she need this FMLA paper work back today and want a call back when ready.

## 2022-05-24 ENCOUNTER — Encounter: Payer: Self-pay | Admitting: Pulmonary Disease

## 2022-05-24 ENCOUNTER — Ambulatory Visit (INDEPENDENT_AMBULATORY_CARE_PROVIDER_SITE_OTHER): Payer: BC Managed Care – PPO | Admitting: Pulmonary Disease

## 2022-05-24 VITALS — BP 116/78 | HR 61 | Ht 67.0 in | Wt 152.4 lb

## 2022-05-24 DIAGNOSIS — M25551 Pain in right hip: Secondary | ICD-10-CM | POA: Diagnosis not present

## 2022-05-24 DIAGNOSIS — M6281 Muscle weakness (generalized): Secondary | ICD-10-CM | POA: Diagnosis not present

## 2022-05-24 DIAGNOSIS — M5459 Other low back pain: Secondary | ICD-10-CM | POA: Diagnosis not present

## 2022-05-24 DIAGNOSIS — M797 Fibromyalgia: Secondary | ICD-10-CM | POA: Diagnosis not present

## 2022-05-24 DIAGNOSIS — F5101 Primary insomnia: Secondary | ICD-10-CM

## 2022-05-24 DIAGNOSIS — R5383 Other fatigue: Secondary | ICD-10-CM

## 2022-05-24 DIAGNOSIS — M25552 Pain in left hip: Secondary | ICD-10-CM | POA: Diagnosis not present

## 2022-05-24 NOTE — Progress Notes (Signed)
Abigail Gomez    FM:[id number]    1966/03/08  Primary Care Physician:Banks, Langley Adie, MD  Referring Physician: Billie Ruddy, MD [address] [place],  [place] [address]  Chief complaint:   Patient being seen for daytime fatigue  HPI:  Was recently seen by nurse practitioner Marland Kitchen Recent sleep study was reviewed showing negative study for significant sleep disordered breathing, patient was scheduled for MSLT  Came in today with ongoing complaints and concerns about her sleep and daytime fatigue  Has multiple comorbidities including anxiety/depression, mood disorder, fibromyalgia.  She is on multiple medications that may contribute to daytime sleepiness On Xanax, Elavil, Flexeril, lurasidone, melatonin, Ambien  She had previously been prescribed Provigil which did help in the past  She takes caffeine pills which sometimes also does help  We discussed findings on recent sleep study, discussed what information an MSLT may provide  She had previously been diagnosed with mild obstructive sleep apnea with an AHI of 7.7, started on CPAP therapy Had a lot of problems with CPAP back in 2017 with a lot of respiratory infections, sinus infections stopped using the CPAP completely about a year ago  Has had multiple hip surgeries  She is exhausted during the day, does not wake up feeling rested she struggles to stay awake Does not have any history of sleep paralysis, no history of cataplexy, no parasomnias She has lost some weight recently  History of hypertension for which she is on 3 medications she stated  Ambien does help her sleep at night, Elavil does help  Outpatient Encounter Medications as of 05/24/2022  Medication Sig   ALPRAZolam (XANAX) 0.5 MG tablet Take 0.5 mg by mouth at bedtime as needed for anxiety.   amitriptyline (ELAVIL) 10 MG tablet TAKE 4 TABLETS (40 MG TOTAL) BY MOUTH AT BEDTIME.   ARMOUR THYROID 30 MG tablet TAKE 1 TABLET BY  MOUTH EVERY DAY   cholecalciferol (VITAMIN D) 1000 units tablet Take 5,000 Units by mouth daily.    cyclobenzaprine (FLEXERIL) 10 MG tablet Take 1 tablet (10 mg total) by mouth 3 (three) times daily as needed for muscle spasms.   diazepam (VALIUM) 10 MG tablet Take 10 mg by mouth at bedtime. Vaginally   estradiol (CLIMARA - DOSED IN MG/24 HR) 0.1 mg/24hr patch Place 0.1 mg onto the skin 2 (two) times a week.   lidocaine (LIDODERM) 5 % PLACE 1 PATCH ONTO THE SKIN DAILY. REMOVE & DISCARD PATCH WITHIN 12 HOURS OR AS DIRECTED BY MD   losartan-hydrochlorothiazide (HYZAAR) 100-25 MG tablet TAKE 1/2 TABLET BY MOUTH DAILY   Lurasidone HCl (LATUDA PO) Take 30 mg by mouth daily.   Magnesium Amino Acid Chelate 20 % POWD Take 450 mg by mouth at bedtime.   Melatonin 10 MG TABS Take 10 mg by mouth at bedtime.    metoprolol tartrate (LOPRESSOR) 50 MG tablet Take 1 tablet (50 mg total) by mouth 2 (two) times daily.   nitrofurantoin, macrocrystal-monohydrate, (MACROBID) 100 MG capsule Take 100 mg by mouth.   rizatriptan (MAXALT-MLT) 10 MG disintegrating tablet DISSOLVE 1 TABLET BY MOUTH DAILY AS NEEDED FOR MIGRAINE   Vibegron (GEMTESA) 75 MG TABS Take 75 mg by mouth daily.   vortioxetine HBr (TRINTELLIX) 20 MG TABS tablet Take 20 mg by mouth daily.    zolpidem (AMBIEN CR) 6.25 MG CR tablet Take 12.5 mg by mouth at bedtime as needed for sleep.   zonisamide (ZONEGRAN) 50 MG capsule  Take 150 mg by mouth at bedtime.   methylPREDNISolone (MEDROL DOSEPAK) 4 MG TBPK tablet As directed   sulfamethoxazole-trimethoprim (BACTRIM DS) 800-160 MG tablet Take 1 tablet by mouth. One tablet before having intercourse. (Patient not taking: Reported on 05/24/2022)   VITAMIN B COMPLEX-C PO Take 1 capsule by mouth daily.   No facility-administered encounter medications on file as of 05/24/2022.    Allergies as of 05/24/2022 - Review Complete 05/24/2022  Allergen Reaction Noted   Peanut oil Anaphylaxis 12/28/2016   Penicillins  Other (See Comments) and Anaphylaxis 11/22/2014   Food  02/22/2016   Lactose intolerance (gi) Other (See Comments) 02/22/2016   Lactulose Other (See Comments) 02/22/2016   Lisinopril Cough 07/25/2015   Other Itching 02/22/2016   Oxycodone-acetaminophen Itching and Other (See Comments) 02/22/2016   Allyl isothiocyanate Hives and Rash 03/04/2019   Banana Rash 03/04/2019    Past Medical History:  Diagnosis Date   Allergy    Anemia    Anxiety    Depression    Disease    lymes disease   Fibromyalgia    H/o Lyme disease    H/O sickle cell trait    History of pelvic fracture    History of urinary incontinence    Hypertension    Hypothyroidism    Insomnia    Labral tear of hip joint    Right Hip   Migraine    Osteoarthritis    RLS (restless legs syndrome)    Sleep apnea    cpap   Spinal stenosis    Thyroid disease    TMJ syndrome    Vitamin D deficiency     Past Surgical History:  Procedure Laterality Date   ABDOMINAL HYSTERECTOMY     BUNIONECTOMY Bilateral    chiari malformation     ENDOMETRIAL ABLATION     MYOMECTOMY     OOPHORECTOMY     TONSILLECTOMY AND ADENOIDECTOMY     TOTAL HIP ARTHROPLASTY Right 02/28/2016   TOTAL HIP ARTHROPLASTY Right 02/28/2016   Procedure: RIGHT TOTAL HIP ARTHROPLASTY ANTERIOR APPROACH;  Surgeon: Mcarthur Rossetti, MD;  Location: [facility];  Service: Orthopedics;  Laterality: Right;    Family History  Problem Relation Age of Onset   Hyperlipidemia Mother    Diabetes Father    Hypertension Father    Cancer Father        prostate   Diabetes Sister    Hypertension Sister    Arthritis Maternal Grandmother    Stroke Maternal Grandmother    Hypertension Maternal Grandmother    Hyperlipidemia Maternal Grandmother    Hyperkalemia Maternal Grandmother    Heart disease Maternal Grandmother    Arthritis Paternal Grandmother    Diabetes Sister     Social History   Socioeconomic History   Marital status: Single    Spouse name: Not  on file   Number of children: Not on file   Years of education: Not on file   Highest education level: Bachelor's degree (e.g., BA, AB, BS)  Occupational History   Not on file  Tobacco Use   Smoking status: Never   Smokeless tobacco: Never  Vaping Use   Vaping Use: Never used  Substance and Sexual Activity   Alcohol use: Yes    Alcohol/week: 0.0 standard drinks of alcohol    Comment: occ   Drug use: No   Sexual activity: Not on file  Other Topics Concern   Not on file  Social History Narrative   Not on file  Social Determinants of Health   Financial Resource Strain: Medium Risk (02/07/2022)   Overall Financial Resource Strain (CARDIA)    Difficulty of Paying Living Expenses: Somewhat hard  Food Insecurity: No Food Insecurity (02/07/2022)   Hunger Vital Sign    Worried About Running Out of Food in the Last Year: Never true    Ran Out of Food in the Last Year: Never true  Transportation Needs: Unmet Transportation Needs (02/07/2022)   PRAPARE - Hydrologist (Medical): Yes    Lack of Transportation (Non-Medical): Yes  Physical Activity: Unknown (02/07/2022)   Exercise Vital Sign    Days of Exercise per Week: 0 days    Minutes of Exercise per Session: Not on file  Stress: Stress Concern Present (02/07/2022)   [place]    Feeling of Stress : Very much  Social Connections: Moderately Isolated (02/07/2022)   Social Connection and Isolation Panel [NHANES]    Frequency of Communication with Friends and Family: More than three times a week    Frequency of Social Gatherings with Friends and Family: Never    Attends Religious Services: Never    Marine scientist or Organizations: Yes    Attends Archivist Meetings: 1 to 4 times per year    Marital Status: Never married  Intimate Partner Violence: Not on file    Review of Systems  Constitutional:  Positive for  fatigue.  Psychiatric/Behavioral:  Positive for sleep disturbance.     Vitals:   05/24/22 1611  BP: 116/78  Pulse: 61  SpO2: 100%     Physical Exam Constitutional:      Appearance: Normal appearance.  HENT:     Head: Normocephalic.     Mouth/Throat:     Mouth: Mucous membranes are moist.  Eyes:     Pupils: Pupils are equal, round, and reactive to light.  Cardiovascular:     Rate and Rhythm: Normal rate.     Heart sounds: No murmur heard.    No friction rub.  Pulmonary:     Effort: No respiratory distress.     Breath sounds: No stridor. No wheezing or rhonchi.  Musculoskeletal:     Cervical back: Normal range of motion.  Neurological:     Mental Status: She is alert.  Psychiatric:        Mood and Affect: Mood normal.       05/24/2022    4:00 PM 02/12/2022    9:00 AM  Results of the Epworth flowsheet  Sitting and reading 0 0  Watching TV 0 0  Sitting, inactive in a public place (e.g. a theatre or a meeting) 1 0  As a passenger in a car for an hour without a break 0 0  Lying down to rest in the afternoon when circumstances permit 0 0  Sitting and talking to someone 0 0  Sitting quietly after a lunch without alcohol 0 0  In a car, while stopped for a few minutes in traffic 2 0  Total score 3 0    Data Reviewed: Most recent sleep study result reviewed showing AHI of 0  Assessment:  Daytime fatigue  Nonrestorative sleep  Past history of mild obstructive sleep apnea with most recent sleep study showing AHI of 0  Mood disorder  Somnolence  Chronic insomnia  History of fibromyalgia  Plan/Recommendations: Following discussions about what an MSLT study is and the information that we see can  an MSLT study, I do not believe this will be helpful at present  She stated she has used Provigil in the past and will follow-up with her behavioral specialist to optimize dosing for this  She does not have significant sleep apnea as a contributor to fatigue  I do  believe the major part of her fatigue symptoms may be related to multiple medications that she is on that may all cause somnolence and also mood disorder, fibromyalgia  Caffeine pills may help, encouraged to try to keep dose below 400 milligrams  Tentative follow-up in about 6 months  Encouraged to call with any significant concerns   Sherrilyn Rist MD [facility] 05/24/2022, 9:01 PM  CC: Billie Ruddy, MD

## 2022-05-24 NOTE — Patient Instructions (Addendum)
Multifactorial reasons for daytime fatigue  Past sleep study showing mild obstructive sleep apnea, since you have lost significant weight since the last study as well, your most recent sleep study was negative for significant sleep apnea-this makes it unlikely that sleep apnea is contributing to daytime fatigue or sleepiness  An MSLT is a way to assess daytime sleepiness -Falling asleep very quickly despite an adequate amount of rest at night  Any medication that 1 uses at night to assist sleep including in your case Xanax, Elavil, Ambien, Flexeril, Valium, melatonin, lurasidone-all have somnolence as a side effect.  Health problems like depression do contribute to insomnia and daytime fatigue  What is important is getting an adequate number of hours of sleep at night 6 to 8 hours  Stimulants during the day will be the best way to address the fatigue and sleepiness  -Amphetamines, dextroamphetamine's, medications like Nuvigil or Provigil -Caffeine -All may be used to help stay safe and functional  I do not believe an MSLT will be beneficial from our discussion  Will see in about 6 months packing this go home with

## 2022-05-24 NOTE — Progress Notes (Deleted)
$'@Patient'k$  ID: Abigail Gomez, female    DOB: April 30, 1965, 57 y.o.   MRN: [id number]  Chief Complaint  Patient presents with   New Patient (Initial Visit)    Hypersomnia     Referring provider: Billie Ruddy, MD  HPI: 57 year old female, never smoker referred for sleep consult. Past medical history significant for migraines, HTN, mild OSA previously on OSA, GERD, hypothyroid, OA of hip, insomnia, hypersomnia, anxiety and depression, fibromyalgia, RLS, sickle cell trait, s/p bunionectomy, HLD.   TEST/EVENTS:   02/12/2022: Today - sleep consult Patient presents today for sleep consult. She was previously seen by Dr. Corrie Dandy in 2017 for sleep apnea. Underwent HST which revealed mild OSA with AHI 7.7/h. She was started on CPAP therapy. She had a lot of trouble with nasal congestion and recurrent sinus infections with PAP therapy. Never tried any other mask besides a nasal. She stopped using it around 1 year ago, if not longer.  She has a longstanding history of trouble with sleeping, which she believes is multifactorial. She has pain in her hips and fibromyalgia so she has trouble getting comfortable, which causes her to toss and turn throughout the night. She has trouble with sleep onset as well. Has tried numerous medications including trazodone, melatonin, lunesta and is currently on 12.5 mg of ambien, which works some nights but not others. She is exhausted throughout the day. Wakes feeling poorly rested. Struggles to stay awake, especially with driving. She has dozed off before. 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 Finds the hours of 10-11:30 am are the hardest to stay awake. Doesn't schedule naps because she has trouble falling asleep. She has occasional morning headaches and dry mouth. She does grind her teeth at night - wears a mouth guard. She has been told she breathes very loudly at night and has even stopped breathing. Previous episodes of sleep  paralysis but these are infrequent. No sudden sleep attacks. Denies sleep parasomnias, snoring. No history of narcolepsy or cataplexy.  She goes to sleep around 11 pm; takes around 2 hours to fall asleep. Wakes multiple times. Gets out of the bed around 7 am. She's down 10 lb over the last two years.  She has a history of hypertension and chronic headaches. No history of stroke or cardiac arrhythmias. She's had multiple surgical procedures over the past 20 years.  She is a never smoker. Doesn't drink alcohol. No caffeine use. She lives by herself. She has a family history of allergies, heart disease, rheumatism.   Epworth 0  Allergies  Allergen Reactions   Peanut Oil Anaphylaxis   Penicillins Other (See Comments) and Anaphylaxis    Syncope Has patient had a PCN reaction causing immediate rash, facial/tongue/throat swelling, SOB or lightheadedness with hypotension:Yes Has patient had a PCN reaction causing severe rash involving mucus membranes or skin necrosis:No Has patient had a PCN reaction that required hospitalization:No Has patient had a PCN reaction occurring within the last 10 years:No If all of the above answers are "NO", then may proceed with Cephalosporin use.    Food     Nuts-itching/swelling Bananas-itching/swelling   Lactose Intolerance (Gi) Other (See Comments)    G.I. upset   Lactulose Other (See Comments)    G.I. upset   Lisinopril Cough   Other Itching    Nuts-itching/swelling Bananas-itching/swelling   Oxycodone-Acetaminophen Itching and Other (See Comments)   Allyl Isothiocyanate Hives and Rash   Banana Rash  Immunization History  Administered Date(s) Administered   Hep A / Hep B 03/08/2008, 09/06/2008   Hepatitis B 04/08/2008   Hepatitis B, PED/ADOLESCENT 04/08/2008   Influenza Split 11/16/2010, 12/01/2012, 12/22/2013, 12/22/2014   Influenza,inj,Quad PF,6+ Mos 12/16/2009, 01/02/2016, 11/22/2016, 11/30/2021   Influenza,inj,quad, With Preservative  02/28/2009, 11/16/2010, 11/22/2014, 01/02/2016, 11/30/2017   Influenza-Unspecified 12/01/2012, 12/22/2013, 11/22/2014, 01/02/2016   MMR 03/08/2008, 02/28/2009   Meningococcal Conjugate 02/28/2009   Meningococcal polysaccharide vaccine (MPSV4) 02/28/2009   Moderna Sars-Covid-2 Vaccination 01/10/2022   PPD Test 12/22/2013, 05/04/2014, 12/08/2014, 01/02/2016   Pneumococcal Polysaccharide-23 02/28/2009   Pneumococcal-Unspecified 02/28/2009   Tdap 02/27/2008    Past Medical History:  Diagnosis Date   Allergy    Anemia    Anxiety    Depression    Disease    lymes disease   Fibromyalgia    H/o Lyme disease    H/O sickle cell trait    History of pelvic fracture    History of urinary incontinence    Hypertension    Hypothyroidism    Insomnia    Labral tear of hip joint    Right Hip   Migraine    Osteoarthritis    RLS (restless legs syndrome)    Sleep apnea    cpap   Spinal stenosis    Thyroid disease    TMJ syndrome    Vitamin D deficiency     Tobacco History: Social History   Tobacco Use  Smoking Status Never  Smokeless Tobacco Never   Counseling given: Not Answered   Outpatient Medications Prior to Visit  Medication Sig Dispense Refill   ALPRAZolam (XANAX) 0.5 MG tablet Take 0.5 mg by mouth at bedtime as needed for anxiety.     amitriptyline (ELAVIL) 10 MG tablet TAKE 4 TABLETS (40 MG TOTAL) BY MOUTH AT BEDTIME. 336 tablet 29   ARMOUR THYROID 30 MG tablet TAKE 1 TABLET BY MOUTH EVERY DAY 90 tablet 1   cholecalciferol (VITAMIN D) 1000 units tablet Take 5,000 Units by mouth daily.      cyclobenzaprine (FLEXERIL) 10 MG tablet Take 1 tablet (10 mg total) by mouth 3 (three) times daily as needed for muscle spasms. 90 tablet 0   diazepam (VALIUM) 10 MG tablet Take 10 mg by mouth at bedtime. Vaginally     estradiol (CLIMARA - DOSED IN MG/24 HR) 0.1 mg/24hr patch Place 0.1 mg onto the skin 2 (two) times a week.     lidocaine (LIDODERM) 5 % PLACE 1 PATCH ONTO THE SKIN  DAILY. REMOVE & DISCARD PATCH WITHIN 12 HOURS OR AS DIRECTED BY MD 90 patch 3   losartan-hydrochlorothiazide (HYZAAR) 100-25 MG tablet TAKE 1/2 TABLET BY MOUTH DAILY 45 tablet 1   Lurasidone HCl (LATUDA PO) Take 30 mg by mouth daily.     Magnesium Amino Acid Chelate 20 % POWD Take 450 mg by mouth at bedtime.     Melatonin 10 MG TABS Take 10 mg by mouth at bedtime.      metoprolol tartrate (LOPRESSOR) 50 MG tablet Take 1 tablet (50 mg total) by mouth 2 (two) times daily. 180 tablet 3   nitrofurantoin, macrocrystal-monohydrate, (MACROBID) 100 MG capsule Take 100 mg by mouth.     rizatriptan (MAXALT-MLT) 10 MG disintegrating tablet DISSOLVE 1 TABLET BY MOUTH DAILY AS NEEDED FOR MIGRAINE 10 tablet 4   Vibegron (GEMTESA) 75 MG TABS Take 75 mg by mouth daily.     vortioxetine HBr (TRINTELLIX) 20 MG TABS tablet Take 20 mg by mouth daily.  zolpidem (AMBIEN CR) 6.25 MG CR tablet Take 12.5 mg by mouth at bedtime as needed for sleep.     zonisamide (ZONEGRAN) 50 MG capsule Take 150 mg by mouth at bedtime.     methylPREDNISolone (MEDROL DOSEPAK) 4 MG TBPK tablet As directed 21 tablet 0   sulfamethoxazole-trimethoprim (BACTRIM DS) 800-160 MG tablet Take 1 tablet by mouth. One tablet before having intercourse. (Patient not taking: Reported on 05/24/2022)     VITAMIN B COMPLEX-C PO Take 1 capsule by mouth daily.     No facility-administered medications prior to visit.     Review of Systems:   Constitutional: No weight loss or gain, night sweats, fevers, chills, or lassitude. +excessive daytime fatigue HEENT: No tooth/dental problems, or sore throat. No sneezing, itching, ear ache, or post nasal drip. +chronic swallowing difficulties, headaches, nasal congestion, sneezing CV:  No chest pain, orthopnea, PND, swelling in lower extremities, anasarca, dizziness, palpitations, syncope Resp: No shortness of breath with exertion or at rest. No excess mucus or change in color of mucus. No productive or  non-productive. No hemoptysis. No wheezing.  No chest wall deformity GI:  +heartburn, indigestion (controlled with medication). No abdominal pain, nausea, vomiting, diarrhea, change in bowel habits GU: No dysuria, change in color of urine, urgency or frequency.  Skin: No rash, lesions, ulcerations MSK:  +chronic hip and back pain.  No decreased range of motion.  Neuro: No dizziness or lightheadedness.  Psych: +anxiety/depression. No SI/HI. Mood stable.     Physical Exam:  BP 116/78 (BP Location: Left Arm, Patient Position: Sitting, Cuff Size: Normal)   Pulse 61   Ht '5\' 7"'$  (1.702 m)   Wt 152 lb 6.4 oz (69.1 kg)   SpO2 100%   BMI 23.87 kg/m   GEN: Pleasant, interactive, well-appearing; in no acute distress HEENT:  Normocephalic and atraumatic. PERRLA. Sclera white. Nasal turbinates pink, moist and patent bilaterally. No rhinorrhea present. Oropharynx pink and moist, without exudate or edema. No lesions, ulcerations, or postnasal drip. Mallampati I/II NECK:  Supple w/ fair ROM. No JVD present. Normal carotid impulses w/o bruits. Thyroid symmetrical with no goiter or nodules palpated. No lymphadenopathy.   CV: RRR, no m/r/g, no peripheral edema. Pulses intact, +2 bilaterally. No cyanosis, pallor or clubbing. PULMONARY:  Unlabored, regular breathing. Clear bilaterally A&P w/o wheezes/rales/rhonchi. No accessory muscle use.  GI: BS present and normoactive. Soft, non-tender to palpation. No organomegaly or masses detected.  MSK: No erythema, warmth or tenderness. Cap refil <2 sec all extrem. No deformities or joint swelling noted.  Neuro: A/Ox3. No focal deficits noted.   Skin: Warm, no lesions or rashe Psych: Normal affect and behavior. Judgement and thought content appropriate.     Lab Results:  CBC    Component Value Date/Time   WBC 6.8 01/17/2022 1355   RBC 4.16 01/17/2022 1355   HGB 12.9 01/17/2022 1355   HGB 12.6 01/28/2020 0000   HCT 36.8 01/17/2022 1355   HCT 36.9  01/28/2020 0000   PLT 159.0 01/17/2022 1355   PLT 196 01/28/2020 0000   MCV 88.4 01/17/2022 1355   MCV 85 01/28/2020 0000   MCH 29.0 01/28/2020 0000   MCH 28.9 03/01/2016 0538   MCHC 35.0 01/17/2022 1355   RDW 13.2 01/17/2022 1355   RDW 15.1 01/28/2020 0000   LYMPHSABS 3.7 01/17/2022 1355   LYMPHSABS 4.0 (H) 01/28/2020 0000   MONOABS 0.6 01/17/2022 1355   EOSABS 0.1 01/17/2022 1355   EOSABS 0.1 01/28/2020 0000   BASOSABS 0.0  01/17/2022 1355   BASOSABS 0.0 01/28/2020 0000    BMET    Component Value Date/Time   NA 135 01/17/2022 1355   NA 137 08/23/2020 0000   K 4.0 01/17/2022 1355   CL 100 01/17/2022 1355   CO2 31 01/17/2022 1355   GLUCOSE 91 01/17/2022 1355   BUN 8 01/17/2022 1355   BUN 11 08/23/2020 0000   CREATININE 0.90 01/17/2022 1355   CALCIUM 9.6 01/17/2022 1355   GFRNONAA 70 01/28/2020 0000   GFRAA 81 01/28/2020 0000    BNP No results found for: "BNP"   Imaging:  No results found.  ketorolac (TORADOL) injection 60 mg     Date Action Dose Route User   04/04/2022 1453 Given 60 mg Intramuscular (Right Ventrogluteal) [place], Andee Poles, CMA           No data to display          No results found for: "NITRICOXIDE"      Assessment & Plan:   No problem-specific Assessment & Plan notes found for this encounter.    I spent 45 minutes of dedicated to the care of this patient on the date of this encounter to include pre-visit review of records, face-to-face time with the patient discussing conditions above, post visit ordering of testing, clinical documentation with the electronic health record, making appropriate referrals as documented, and communicating necessary findings to members of the patients care team.  Laurin Coder, MD 05/24/2022  Pt aware and understands NP's role.

## 2022-05-25 NOTE — Telephone Encounter (Signed)
FYI  Contacted patient and made her aware that the form is completed and ready to be pick up at the front desk. Verbalized understanding.

## 2022-05-28 DIAGNOSIS — M25551 Pain in right hip: Secondary | ICD-10-CM | POA: Diagnosis not present

## 2022-05-28 DIAGNOSIS — M6281 Muscle weakness (generalized): Secondary | ICD-10-CM | POA: Diagnosis not present

## 2022-05-28 DIAGNOSIS — M25552 Pain in left hip: Secondary | ICD-10-CM | POA: Diagnosis not present

## 2022-05-28 DIAGNOSIS — M5459 Other low back pain: Secondary | ICD-10-CM | POA: Diagnosis not present

## 2022-05-31 DIAGNOSIS — M6281 Muscle weakness (generalized): Secondary | ICD-10-CM | POA: Diagnosis not present

## 2022-05-31 DIAGNOSIS — M25551 Pain in right hip: Secondary | ICD-10-CM | POA: Diagnosis not present

## 2022-05-31 DIAGNOSIS — M25552 Pain in left hip: Secondary | ICD-10-CM | POA: Diagnosis not present

## 2022-05-31 DIAGNOSIS — M5459 Other low back pain: Secondary | ICD-10-CM | POA: Diagnosis not present

## 2022-06-02 ENCOUNTER — Other Ambulatory Visit: Payer: Self-pay | Admitting: Family Medicine

## 2022-06-21 DIAGNOSIS — M25552 Pain in left hip: Secondary | ICD-10-CM | POA: Diagnosis not present

## 2022-06-21 DIAGNOSIS — M6281 Muscle weakness (generalized): Secondary | ICD-10-CM | POA: Diagnosis not present

## 2022-06-21 DIAGNOSIS — M5459 Other low back pain: Secondary | ICD-10-CM | POA: Diagnosis not present

## 2022-06-21 DIAGNOSIS — M25551 Pain in right hip: Secondary | ICD-10-CM | POA: Diagnosis not present

## 2022-07-03 DIAGNOSIS — F331 Major depressive disorder, recurrent, moderate: Secondary | ICD-10-CM | POA: Diagnosis not present

## 2022-07-04 DIAGNOSIS — M4316 Spondylolisthesis, lumbar region: Secondary | ICD-10-CM | POA: Diagnosis not present

## 2022-07-04 DIAGNOSIS — M48061 Spinal stenosis, lumbar region without neurogenic claudication: Secondary | ICD-10-CM | POA: Diagnosis not present

## 2022-07-04 DIAGNOSIS — M5116 Intervertebral disc disorders with radiculopathy, lumbar region: Secondary | ICD-10-CM | POA: Diagnosis not present

## 2022-07-04 DIAGNOSIS — M4726 Other spondylosis with radiculopathy, lumbar region: Secondary | ICD-10-CM | POA: Diagnosis not present

## 2022-07-05 ENCOUNTER — Telehealth: Payer: Self-pay | Admitting: Pulmonary Disease

## 2022-07-05 DIAGNOSIS — M533 Sacrococcygeal disorders, not elsewhere classified: Secondary | ICD-10-CM | POA: Diagnosis not present

## 2022-07-05 NOTE — Telephone Encounter (Signed)
Patient called to speak w/office manager regarding her visits w/two of our physicians, Dr. Wynona Neat and Micheline Maze.  She stated that they both at bad bedside manner and she did not want to see either again, and had a complaint and wanted to speak with the office manager.  Please advise and call patient to discuss further at [phone]

## 2022-07-05 NOTE — Telephone Encounter (Signed)
Email sent to Vernona Rieger to call patient to discuss matter.

## 2022-07-09 DIAGNOSIS — M5459 Other low back pain: Secondary | ICD-10-CM | POA: Diagnosis not present

## 2022-07-09 DIAGNOSIS — M6281 Muscle weakness (generalized): Secondary | ICD-10-CM | POA: Diagnosis not present

## 2022-07-09 DIAGNOSIS — M25552 Pain in left hip: Secondary | ICD-10-CM | POA: Diagnosis not present

## 2022-07-09 DIAGNOSIS — M25551 Pain in right hip: Secondary | ICD-10-CM | POA: Diagnosis not present

## 2022-07-11 DIAGNOSIS — M25551 Pain in right hip: Secondary | ICD-10-CM | POA: Diagnosis not present

## 2022-07-11 DIAGNOSIS — M6281 Muscle weakness (generalized): Secondary | ICD-10-CM | POA: Diagnosis not present

## 2022-07-11 DIAGNOSIS — M25552 Pain in left hip: Secondary | ICD-10-CM | POA: Diagnosis not present

## 2022-07-11 DIAGNOSIS — M5459 Other low back pain: Secondary | ICD-10-CM | POA: Diagnosis not present

## 2022-07-12 DIAGNOSIS — M25551 Pain in right hip: Secondary | ICD-10-CM | POA: Diagnosis not present

## 2022-07-12 DIAGNOSIS — M5459 Other low back pain: Secondary | ICD-10-CM | POA: Diagnosis not present

## 2022-07-12 DIAGNOSIS — M25552 Pain in left hip: Secondary | ICD-10-CM | POA: Diagnosis not present

## 2022-07-12 DIAGNOSIS — M6281 Muscle weakness (generalized): Secondary | ICD-10-CM | POA: Diagnosis not present

## 2022-07-12 NOTE — Telephone Encounter (Signed)
PT calling again wanting to speak to practice manager and has not rec'd a call back.  . States once again she was unhappy w/bedside manner of our Providers.  Says she is falling asleep at the wheel of her company car and was told she would just have to be on a stimulant the rest of her life since she has no sleep apnea. She knows there are two types of apnea and she wants another provider for a 2nd opinion.  I am not able to add Vernona Rieger to this. Please fwd.

## 2022-07-17 DIAGNOSIS — N3941 Urge incontinence: Secondary | ICD-10-CM | POA: Diagnosis not present

## 2022-07-25 NOTE — Telephone Encounter (Signed)
Sent follow up email to Vernona Rieger.

## 2022-08-08 DIAGNOSIS — M5459 Other low back pain: Secondary | ICD-10-CM | POA: Diagnosis not present

## 2022-08-08 DIAGNOSIS — M25551 Pain in right hip: Secondary | ICD-10-CM | POA: Diagnosis not present

## 2022-08-08 DIAGNOSIS — M6281 Muscle weakness (generalized): Secondary | ICD-10-CM | POA: Diagnosis not present

## 2022-08-08 DIAGNOSIS — M25552 Pain in left hip: Secondary | ICD-10-CM | POA: Diagnosis not present

## 2022-08-08 NOTE — Telephone Encounter (Signed)
Pt calling in requesting to speak with Abigail Gomez and was upset she was unable to do so. Pt stated that this is ridiculous and she will need to find her a new pulmonologist. Please contact her at [phone]

## 2022-08-14 DIAGNOSIS — M25551 Pain in right hip: Secondary | ICD-10-CM | POA: Diagnosis not present

## 2022-08-14 DIAGNOSIS — M25552 Pain in left hip: Secondary | ICD-10-CM | POA: Diagnosis not present

## 2022-08-14 DIAGNOSIS — M5459 Other low back pain: Secondary | ICD-10-CM | POA: Diagnosis not present

## 2022-08-14 DIAGNOSIS — M6281 Muscle weakness (generalized): Secondary | ICD-10-CM | POA: Diagnosis not present

## 2022-08-16 DIAGNOSIS — M48062 Spinal stenosis, lumbar region with neurogenic claudication: Secondary | ICD-10-CM | POA: Diagnosis not present

## 2022-08-16 DIAGNOSIS — M5459 Other low back pain: Secondary | ICD-10-CM | POA: Diagnosis not present

## 2022-08-16 DIAGNOSIS — M533 Sacrococcygeal disorders, not elsewhere classified: Secondary | ICD-10-CM | POA: Diagnosis not present

## 2022-08-16 DIAGNOSIS — M25551 Pain in right hip: Secondary | ICD-10-CM | POA: Diagnosis not present

## 2022-08-16 DIAGNOSIS — M25552 Pain in left hip: Secondary | ICD-10-CM | POA: Diagnosis not present

## 2022-08-16 DIAGNOSIS — M6281 Muscle weakness (generalized): Secondary | ICD-10-CM | POA: Diagnosis not present

## 2022-08-16 DIAGNOSIS — Z5181 Encounter for therapeutic drug level monitoring: Secondary | ICD-10-CM | POA: Diagnosis not present

## 2022-08-20 DIAGNOSIS — H40023 Open angle with borderline findings, high risk, bilateral: Secondary | ICD-10-CM | POA: Diagnosis not present

## 2022-08-21 DIAGNOSIS — M25552 Pain in left hip: Secondary | ICD-10-CM | POA: Diagnosis not present

## 2022-08-21 DIAGNOSIS — M5459 Other low back pain: Secondary | ICD-10-CM | POA: Diagnosis not present

## 2022-08-21 DIAGNOSIS — M25551 Pain in right hip: Secondary | ICD-10-CM | POA: Diagnosis not present

## 2022-08-21 DIAGNOSIS — M6281 Muscle weakness (generalized): Secondary | ICD-10-CM | POA: Diagnosis not present

## 2022-08-22 DIAGNOSIS — M545 Low back pain, unspecified: Secondary | ICD-10-CM | POA: Diagnosis not present

## 2022-08-22 DIAGNOSIS — M48061 Spinal stenosis, lumbar region without neurogenic claudication: Secondary | ICD-10-CM | POA: Diagnosis not present

## 2022-08-22 DIAGNOSIS — G8929 Other chronic pain: Secondary | ICD-10-CM | POA: Diagnosis not present

## 2022-08-22 DIAGNOSIS — M797 Fibromyalgia: Secondary | ICD-10-CM | POA: Diagnosis not present

## 2022-08-22 DIAGNOSIS — M7918 Myalgia, other site: Secondary | ICD-10-CM | POA: Diagnosis not present

## 2022-08-22 NOTE — Telephone Encounter (Signed)
Documentation will be printed and give to leadership.

## 2022-08-23 DIAGNOSIS — M6281 Muscle weakness (generalized): Secondary | ICD-10-CM | POA: Diagnosis not present

## 2022-08-23 DIAGNOSIS — M25552 Pain in left hip: Secondary | ICD-10-CM | POA: Diagnosis not present

## 2022-08-23 DIAGNOSIS — M25551 Pain in right hip: Secondary | ICD-10-CM | POA: Diagnosis not present

## 2022-08-23 DIAGNOSIS — M5459 Other low back pain: Secondary | ICD-10-CM | POA: Diagnosis not present

## 2022-09-05 ENCOUNTER — Other Ambulatory Visit: Payer: Self-pay | Admitting: Family Medicine

## 2022-09-12 ENCOUNTER — Telehealth: Payer: Self-pay | Admitting: Pulmonary Disease
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
## 2022-09-14 NOTE — Telephone Encounter (Signed)
I can see when space available. No held spots, no video visits. Second opinion for hypersomnia.

## 2022-09-14 NOTE — Telephone Encounter (Signed)
Spoke with patient. I have her scheduled to see Dr. Maple Hudson in August. Closing encounter. NFN

## 2022-09-14 NOTE — Telephone Encounter (Signed)
Dr. Maple Hudson are you okay with me scheduling patient with you.

## 2022-09-14 NOTE — Telephone Encounter (Signed)
Okay with me 

## 2022-09-14 NOTE — Telephone Encounter (Signed)
Dr. Val Eagle are you okay with patient switching

## 2022-09-19 DIAGNOSIS — F331 Major depressive disorder, recurrent, moderate: Secondary | ICD-10-CM | POA: Diagnosis not present

## 2022-10-11 ENCOUNTER — Telehealth: Payer: Self-pay | Admitting: Family Medicine

## 2022-10-11 ENCOUNTER — Encounter: Payer: Self-pay | Admitting: Family Medicine

## 2022-10-11 ENCOUNTER — Ambulatory Visit: Payer: BC Managed Care – PPO | Admitting: Family Medicine

## 2022-10-11 VITALS — BP 138/82 | HR 78 | Temp 98.7°F | Wt 152.0 lb

## 2022-10-11 DIAGNOSIS — M2170 Unequal limb length (acquired), unspecified site: Secondary | ICD-10-CM | POA: Diagnosis not present

## 2022-10-11 DIAGNOSIS — G8929 Other chronic pain: Secondary | ICD-10-CM

## 2022-10-11 DIAGNOSIS — M797 Fibromyalgia: Secondary | ICD-10-CM

## 2022-10-11 DIAGNOSIS — Z96641 Presence of right artificial hip joint: Secondary | ICD-10-CM

## 2022-10-11 DIAGNOSIS — F439 Reaction to severe stress, unspecified: Secondary | ICD-10-CM

## 2022-10-11 DIAGNOSIS — L739 Follicular disorder, unspecified: Secondary | ICD-10-CM | POA: Diagnosis not present

## 2022-10-11 DIAGNOSIS — B379 Candidiasis, unspecified: Secondary | ICD-10-CM | POA: Diagnosis not present

## 2022-10-11 DIAGNOSIS — M545 Low back pain, unspecified: Secondary | ICD-10-CM

## 2022-10-11 DIAGNOSIS — Z471 Aftercare following joint replacement surgery: Secondary | ICD-10-CM | POA: Diagnosis not present

## 2022-10-11 DIAGNOSIS — T3695XA Adverse effect of unspecified systemic antibiotic, initial encounter: Secondary | ICD-10-CM

## 2022-10-11 DIAGNOSIS — G4730 Sleep apnea, unspecified: Secondary | ICD-10-CM

## 2022-10-11 DIAGNOSIS — M7071 Other bursitis of hip, right hip: Secondary | ICD-10-CM | POA: Diagnosis not present

## 2022-10-11 MED ORDER — DOXYCYCLINE HYCLATE 100 MG PO TABS
100.0000 mg | ORAL_TABLET | Freq: Two times a day (BID) | ORAL | 0 refills | Status: AC
Start: 2022-10-11 — End: 2022-10-18

## 2022-10-11 MED ORDER — FLUCONAZOLE 150 MG PO TABS
ORAL_TABLET | ORAL | 0 refills | Status: DC
Start: 2022-10-11 — End: 2024-01-08

## 2022-10-11 NOTE — Telephone Encounter (Signed)
Pt was just seen by MD.  Pt called back to say she forgot to tell MD that - She saw a [facility] Pulmonologist for sleep apnea  Pt had a Sleep Study test done After test, Pt was told to just take some "No doze" pills  Pt is requesting to see another Pulmonologist, elsewhere.

## 2022-10-11 NOTE — Progress Notes (Signed)
Established Patient Office Visit   Subjective  Patient ID: Abigail Gomez, female    DOB: [birth year]  Age: 57 y.o. MRN: [id number]  Chief Complaint  Patient presents with   Rash    Around mouth, spoke with a colleague and was told it was a type of dermatitis. May need abx   Fibromyalgia    Needs someone to manage it. Letter from OB/GYN is in media tab. Cyst in sacram    Patient is a 57 year old female seen for follow-up on ongoing chronic conditions.  Patient states she had imaging of low back done however the report and impression contradict regarding spinal stenosis at L3-4 and L4-5.  Foraminal stenosis was noted at L3-4 and L4-5.  Patient states neurosurgery said they have nothing left to offer.  Patient with continued low back pain, weakness in bilateral LEs, hip pain due to iliopsoas problems and hip surgeries.  Patient with leg length discrepancy status post surgery.  Patient notes rheumatologist in [place] had nothing to offer her fibromyalgia symptoms.  Patient seen urology/urogyn for history of incontinence.  Having to wear poise pads and adult briefs.  Next steps are to try Botox then possibly bladder stem.  Patient notes fine bumps around mouth after plucking hairs.  Bumps present times several weeks.  Slightly pruritic.  Patient endorses being under increased stress.  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  Patient continues to travel to [place] to care for her parents each weekend.   Patient Active Problem List   Diagnosis Date Noted   Physical exam 08/31/2016   Hyperlipidemia 02/28/2016   Osteoarthritis of right hip 02/28/2016   Status post total replacement of right hip 02/28/2016   Obstructive sleep apnea syndrome 02/01/2016   Pain of right hip joint 02/01/2016   Hypertension 01/31/2016   Restless leg syndrome 01/31/2016   Sickle cell trait (HCC) 01/31/2016   Gastroesophageal reflux 01/31/2016   History of  bunionectomy of both great toes 01/31/2016   History of pelvic fracture 01/31/2016   Trochanteric bursitis, right hip 01/26/2016   Anxiety and depression 09/19/2015   Fibromyalgia 09/19/2015   Migraines 09/19/2015   Hypothyroidism 09/19/2015   Insomnia 08/20/2015   Hypersomnia 08/20/2015   Depression 08/20/2015   Past Surgical History:  Procedure Laterality Date   ABDOMINAL HYSTERECTOMY     BUNIONECTOMY Bilateral    chiari malformation     ENDOMETRIAL ABLATION     MYOMECTOMY     OOPHORECTOMY     TONSILLECTOMY AND ADENOIDECTOMY     TOTAL HIP ARTHROPLASTY Right 02/28/2016   TOTAL HIP ARTHROPLASTY Right 02/28/2016   Procedure: RIGHT TOTAL HIP ARTHROPLASTY ANTERIOR APPROACH;  Surgeon: Kathryne Hitch, MD;  Location: [facility] OR;  Service: Orthopedics;  Laterality: Right;   Social History   Tobacco Use   Smoking status: Never   Smokeless tobacco: Never  Vaping Use   Vaping status: Never Used  Substance Use Topics   Alcohol use: Yes    Alcohol/week: 0.0 standard drinks of alcohol    Comment: occ   Drug use: No   Family History  Problem Relation Age of Onset   Hyperlipidemia Mother    Diabetes Father    Hypertension Father    Cancer Father        prostate   Diabetes Sister    Hypertension Sister    Arthritis Maternal Grandmother    Stroke Maternal Grandmother    Hypertension Maternal Grandmother  Hyperlipidemia Maternal Grandmother    Hyperkalemia Maternal Grandmother    Heart disease Maternal Grandmother    Arthritis Paternal Grandmother    Diabetes Sister    Allergies  Allergen Reactions   Peanut Oil Anaphylaxis   Penicillins Other (See Comments) and Anaphylaxis    Syncope Has patient had a PCN reaction causing immediate rash, facial/tongue/throat swelling, SOB or lightheadedness with hypotension:Yes Has patient had a PCN reaction causing severe rash involving mucus membranes or skin necrosis:No Has patient had a PCN reaction that required  hospitalization:No Has patient had a PCN reaction occurring within the last 10 years:No If all of the above answers are "NO", then may proceed with Cephalosporin use.    Food     Nuts-itching/swelling Bananas-itching/swelling   Lactose Intolerance (Gi) Other (See Comments)    G.I. upset   Lactulose Other (See Comments)    G.I. upset   Lisinopril Cough   Other Itching    Nuts-itching/swelling Bananas-itching/swelling   Oxycodone-Acetaminophen Itching and Other (See Comments)   Allyl Isothiocyanate Hives and Rash   Banana Rash      ROS Negative unless stated above    Objective:     BP 138/82 (BP Location: Right Arm, Patient Position: Sitting, Cuff Size: Normal)   Pulse 78   Temp 98.7 F (37.1 C) (Oral)   Wt 152 lb (68.9 kg)   SpO2 99%   BMI 23.81 kg/m    Physical Exam Constitutional:      General: She is not in acute distress.    Appearance: Normal appearance.  HENT:     Head: Normocephalic and atraumatic.     Nose: Nose normal.     Mouth/Throat:     Mouth: Mucous membranes are moist.  Eyes:     Extraocular Movements: Extraocular movements intact.     Conjunctiva/sclera: Conjunctivae normal.  Cardiovascular:     Rate and Rhythm: Normal rate.  Pulmonary:     Effort: Pulmonary effort is normal.  Skin:    General: Skin is warm and dry.     Comments: 2 mm perioral pustules and .  Neurological:     Mental Status: She is alert and oriented to person, place, and time. Mental status is at baseline.       10/11/2022    8:40 AM 04/04/2022    2:57 PM 02/07/2022    2:26 PM  Depression screen PHQ 2/9  Decreased Interest 0 1 1  Down, Depressed, Hopeless 1 1 2   PHQ - 2 Score 1 2 3   Altered sleeping 0 1 3  Tired, decreased energy 0 1 3  Change in appetite  1 2  Feeling bad or failure about yourself  1  0  Trouble concentrating 0 0 1  Moving slowly or fidgety/restless 0 0 1  Suicidal thoughts 0 0 1  PHQ-9 Score 2 5 14   Difficult doing work/chores Extremely  dIfficult Extremely dIfficult Somewhat difficult      10/11/2022    8:41 AM 04/04/2022    2:57 PM  GAD 7 : Generalized Anxiety Score  Nervous, Anxious, on Edge  1  Control/stop worrying 1 1  Worry too much - different things 1 1  Trouble relaxing 1 1  Restless 0 0  Easily annoyed or irritable 1   Afraid - awful might happen 1   Anxiety Difficulty Very difficult Extremely difficult      No results found for any visits on 10/11/22.    Assessment & Plan:  Folliculitis -  Doxycycline Hyclate; Take 1 tablet (100 mg total) by mouth 2 (two) times daily for 7 days.  Dispense: 14 tablet; Refill: 0  Antibiotic-induced yeast infection -     Fluconazole; Take 1 tab now.  Repeat dose in 3 days if needed.  Dispense: 3 tablet; Refill: 0  Fibromyalgia -continued flares worsened by chronic back and hip pain -Unfortunately having trouble finding a rheumatologist willing to treat new pts with fibro  -will place new referral to Rheumatology if needed after pt researches providers -continue elavil 40 mg qhs -Continue supportive care. -Consider new pain management referral, Himalayan salt room, etc.  Acquired leg length discrepancy -s/p hip surgeries  Chronic midline low back pain without sciatica -likely caused/worsened by acquired leg length discrepancy -continued symptoms despite numerous therapies  -continue f/u with Ortho -consider second opinion with pain management.  Status post total replacement of right hip -continue f/u with Ortho  Stress -continue self care, counseling, etc  Return if symptoms worsen or fail to improve.  F/u in the next few months prn.  Deeann Saint, MD

## 2022-10-12 NOTE — Telephone Encounter (Signed)
Okay 

## 2022-10-15 NOTE — Telephone Encounter (Signed)
Referral placed.

## 2022-10-17 DIAGNOSIS — N3289 Other specified disorders of bladder: Secondary | ICD-10-CM | POA: Diagnosis not present

## 2022-10-17 DIAGNOSIS — R32 Unspecified urinary incontinence: Secondary | ICD-10-CM | POA: Diagnosis not present

## 2022-10-20 MED ORDER — AMITRIPTYLINE HCL 10 MG PO TABS: 40.0000 mg | ORAL_TABLET | Freq: Every day | ORAL | 3 refills | Status: DC

## 2022-10-24 NOTE — Progress Notes (Deleted)
10/25/22- 57 yoF never smoker for sleep evaluation- second opinion with concern of fatigue/ hypersomnia/ Insomnia. Medical problem list includes ASCVD/ PAD, HTN, Migraine, hx mild OSA, GERD, Hypothyoid, Osteoarthritis, Anxiety/Depression, Hyperlipidemia,  HST 10/12/15- AHI/ 7.7/ hr- Dr DeDios suggested trial CPAP auto 5-15 NPSG 02/27/22- AHI 0.2/ hr, weight 165 lbs Dr Wynona Neat ov 05/24/22- noted comorbidities including anxiety/depression, mood disorder, fibromyalgia. Also noted complicating medications including Xanax, Elavil, Flexeril, lurasidone, melatonin, ambien, as well as prior benefit from Provigil. He got no hx of sleep paralysis, cataplexy or parasomnia. He suggested she optimize provigil dosing with her behavioral health specialist and suggested she might try caffeine tab. He did not feel MSLT would likely help. Epworth score- Body weight today-

## 2022-10-25 ENCOUNTER — Ambulatory Visit: Payer: BC Managed Care – PPO | Admitting: Internal Medicine

## 2022-11-01 ENCOUNTER — Other Ambulatory Visit: Payer: Self-pay | Admitting: Family Medicine

## 2022-11-01 DIAGNOSIS — M4800 Spinal stenosis, site unspecified: Secondary | ICD-10-CM

## 2022-11-01 DIAGNOSIS — G8929 Other chronic pain: Secondary | ICD-10-CM

## 2022-11-01 DIAGNOSIS — M5136 Other intervertebral disc degeneration, lumbar region: Secondary | ICD-10-CM

## 2022-11-02 ENCOUNTER — Telehealth: Payer: Self-pay | Admitting: Family Medicine

## 2022-11-02 NOTE — Telephone Encounter (Signed)
Requesting refill of doxycycline to help with infection she was treated for on 10/11/22. Says it still persists

## 2022-11-08 NOTE — Telephone Encounter (Signed)
If rash did not clear up with initial course of doxycycline, then would advised pt to schedule appt with dermatology for further evaluation.

## 2022-11-09 NOTE — Telephone Encounter (Addendum)
Pt was very upset and yelling into phone stating this is the third time this week she has called and she is still waiting for a call back. Pt was screaming and saying this is unacceptable and MD needs to call her back TODAY!!  Pt was read previous message advising a dermatology visit. Pt was asked if she'd like to request a referral? Pt was still upset and stated she will call a dermatologist herself, but MD still needs to call her back!!

## 2022-11-09 NOTE — Telephone Encounter (Signed)
Patient was called and voice mail left to return phone call

## 2022-11-14 DIAGNOSIS — M533 Sacrococcygeal disorders, not elsewhere classified: Secondary | ICD-10-CM | POA: Diagnosis not present

## 2022-11-14 DIAGNOSIS — L710 Perioral dermatitis: Secondary | ICD-10-CM | POA: Diagnosis not present

## 2022-11-14 NOTE — Telephone Encounter (Signed)
Pt called this morning asking how or who can give her the diagnosis for the dermatologist?? Pt stated she needs this information within the next 20 minutes, as her dermatology appointment is at 8:30 am. CMA was unavailable. Pt is asking for a call back ASAP.

## 2022-11-14 NOTE — Telephone Encounter (Signed)
Patient was called, patient is aware

## 2022-11-22 DIAGNOSIS — M7071 Other bursitis of hip, right hip: Secondary | ICD-10-CM | POA: Diagnosis not present

## 2022-12-19 ENCOUNTER — Ambulatory Visit (INDEPENDENT_AMBULATORY_CARE_PROVIDER_SITE_OTHER): Payer: BC Managed Care – PPO | Admitting: Family Medicine

## 2022-12-19 ENCOUNTER — Encounter: Payer: Self-pay | Admitting: Family Medicine

## 2022-12-19 VITALS — BP 140/80 | HR 51 | Temp 98.8°F | Ht 67.0 in | Wt 151.6 lb

## 2022-12-19 DIAGNOSIS — M797 Fibromyalgia: Secondary | ICD-10-CM | POA: Diagnosis not present

## 2022-12-19 DIAGNOSIS — Z0001 Encounter for general adult medical examination with abnormal findings: Secondary | ICD-10-CM

## 2022-12-19 DIAGNOSIS — G8929 Other chronic pain: Secondary | ICD-10-CM | POA: Diagnosis not present

## 2022-12-19 DIAGNOSIS — M545 Low back pain, unspecified: Secondary | ICD-10-CM

## 2022-12-19 DIAGNOSIS — R1011 Right upper quadrant pain: Secondary | ICD-10-CM | POA: Diagnosis not present

## 2022-12-19 DIAGNOSIS — Z23 Encounter for immunization: Secondary | ICD-10-CM | POA: Diagnosis not present

## 2022-12-19 DIAGNOSIS — Z0000 Encounter for general adult medical examination without abnormal findings: Secondary | ICD-10-CM | POA: Diagnosis not present

## 2022-12-19 DIAGNOSIS — M2550 Pain in unspecified joint: Secondary | ICD-10-CM | POA: Diagnosis not present

## 2022-12-19 DIAGNOSIS — R202 Paresthesia of skin: Secondary | ICD-10-CM

## 2022-12-19 LAB — FOLATE: Folate: 12.2 ng/mL (ref >5.9)

## 2022-12-19 LAB — LIPID PANEL
Cholesterol: 168 mg/dL (ref 0–200)
HDL: 56.3 mg/dL (ref >39.00)
LDL Cholesterol: 102 mg/dL — ABNORMAL HIGH (ref 0–99)
NonHDL: 111.97
Total CHOL/HDL Ratio: 3
Triglycerides: 49 mg/dL (ref 0.0–149.0)
VLDL: 9.8 mg/dL (ref 0.0–40.0)

## 2022-12-19 LAB — CBC WITH DIFFERENTIAL/PLATELET
Basophils Absolute: 0 10*3/uL (ref 0.0–0.1)
Basophils Relative: 0.4 % (ref 0.0–3.0)
Eosinophils Absolute: 0 10*3/uL (ref 0.0–0.7)
Eosinophils Relative: 0.8 % (ref 0.0–5.0)
HCT: 37.5 % (ref 36.0–46.0)
Hemoglobin: 12.9 g/dL (ref 12.0–15.0)
Lymphocytes Relative: 52.2 % — ABNORMAL HIGH (ref 12.0–46.0)
Lymphs Abs: 3.3 10*3/uL (ref 0.7–4.0)
MCHC: 34.4 g/dL (ref 30.0–36.0)
MCV: 88.7 fL (ref 78.0–100.0)
Monocytes Absolute: 0.4 10*3/uL (ref 0.1–1.0)
Monocytes Relative: 6.3 % (ref 3.0–12.0)
Neutro Abs: 2.5 10*3/uL (ref 1.4–7.7)
Neutrophils Relative %: 40.3 % — ABNORMAL LOW (ref 43.0–77.0)
Platelets: 174 10*3/uL (ref 150.0–400.0)
RBC: 4.23 Mil/uL (ref 3.87–5.11)
RDW: 14.5 % (ref 11.5–15.5)
WBC: 6.3 10*3/uL (ref 4.0–10.5)

## 2022-12-19 LAB — COMPREHENSIVE METABOLIC PANEL
ALT: 44 U/L — ABNORMAL HIGH (ref 0–35)
AST: 45 U/L — ABNORMAL HIGH (ref 0–37)
Albumin: 4.2 g/dL (ref 3.5–5.2)
Alkaline Phosphatase: 105 U/L (ref 39–117)
BUN: 14 mg/dL (ref 6–23)
CO2: 31 meq/L (ref 19–32)
Calcium: 9.4 mg/dL (ref 8.4–10.5)
Chloride: 103 meq/L (ref 96–112)
Creatinine, Ser: 1 mg/dL (ref 0.40–1.20)
GFR: 63.54 mL/min (ref >60.00)
Glucose, Bld: 105 mg/dL — ABNORMAL HIGH (ref 70–99)
Potassium: 4.2 meq/L (ref 3.5–5.1)
Sodium: 139 meq/L (ref 135–145)
Total Bilirubin: 0.8 mg/dL (ref 0.2–1.2)
Total Protein: 6.4 g/dL (ref 6.0–8.3)

## 2022-12-19 LAB — VITAMIN D 25 HYDROXY (VIT D DEFICIENCY, FRACTURES): VITD: 59.88 ng/mL (ref 30.00–100.00)

## 2022-12-19 LAB — SEDIMENTATION RATE: Sed Rate: 8 mm/h (ref 0–30)

## 2022-12-19 LAB — T4, FREE: Free T4: 0.97 ng/dL (ref 0.60–1.60)

## 2022-12-19 LAB — TSH: TSH: 3.33 u[IU]/mL (ref 0.35–5.50)

## 2022-12-19 LAB — HEMOGLOBIN A1C: Hgb A1c MFr Bld: 6.1 % (ref 4.6–6.5)

## 2022-12-19 LAB — VITAMIN B12: Vitamin B-12: 242 pg/mL (ref 211–911)
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
## 2022-12-20 DIAGNOSIS — M25552 Pain in left hip: Secondary | ICD-10-CM | POA: Diagnosis not present

## 2022-12-20 DIAGNOSIS — M542 Cervicalgia: Secondary | ICD-10-CM | POA: Diagnosis not present

## 2022-12-20 DIAGNOSIS — M25551 Pain in right hip: Secondary | ICD-10-CM | POA: Diagnosis not present

## 2022-12-20 DIAGNOSIS — M5459 Other low back pain: Secondary | ICD-10-CM | POA: Diagnosis not present

## 2022-12-20 DIAGNOSIS — F331 Major depressive disorder, recurrent, moderate: Secondary | ICD-10-CM | POA: Diagnosis not present

## 2022-12-20 LAB — RHEUMATOID FACTOR: Rheumatoid fact SerPl-aCnc: <10 [IU]/mL (ref <14)

## 2022-12-21 LAB — LUPUS (SLE) ANALYSIS
Anti Nuclear Antibody (ANA): NEGATIVE
Anti-striation Abs: NEGATIVE
Complement C4, Serum: 28 mg/dL (ref 12–38)
ENA RNP Ab: <0.2 AI (ref 0.0–0.9)
ENA SM Ab Ser-aCnc: <0.2 AI (ref 0.0–0.9)
ENA SSA (RO) Ab: <0.2 AI (ref 0.0–0.9)
ENA SSB (LA) Ab: <0.2 AI (ref 0.0–0.9)
Mitochondrial Ab: <20 U (ref 0.0–20.0)
Parietal Cell Ab: 0.5 U (ref 0.0–20.0)
Scleroderma (Scl-70) (ENA) Antibody, IgG: <0.2 AI (ref 0.0–0.9)
Smooth Muscle Ab: 3 U (ref 0–19)
Thyroperoxidase Ab SerPl-aCnc: <9 [IU]/mL (ref 0–34)
dsDNA Ab: <1 [IU]/mL (ref 0–9)

## 2022-12-26 DIAGNOSIS — M25551 Pain in right hip: Secondary | ICD-10-CM | POA: Diagnosis not present

## 2022-12-26 DIAGNOSIS — M542 Cervicalgia: Secondary | ICD-10-CM | POA: Diagnosis not present

## 2022-12-26 DIAGNOSIS — M25552 Pain in left hip: Secondary | ICD-10-CM | POA: Diagnosis not present

## 2022-12-26 DIAGNOSIS — M5459 Other low back pain: Secondary | ICD-10-CM | POA: Diagnosis not present

## 2022-12-27 DIAGNOSIS — M5459 Other low back pain: Secondary | ICD-10-CM | POA: Diagnosis not present

## 2022-12-27 DIAGNOSIS — M542 Cervicalgia: Secondary | ICD-10-CM | POA: Diagnosis not present

## 2022-12-27 DIAGNOSIS — M25551 Pain in right hip: Secondary | ICD-10-CM | POA: Diagnosis not present

## 2022-12-27 DIAGNOSIS — M25552 Pain in left hip: Secondary | ICD-10-CM | POA: Diagnosis not present

## 2022-12-28 DIAGNOSIS — M5459 Other low back pain: Secondary | ICD-10-CM | POA: Diagnosis not present

## 2022-12-28 DIAGNOSIS — Z96649 Presence of unspecified artificial hip joint: Secondary | ICD-10-CM | POA: Diagnosis not present

## 2022-12-28 DIAGNOSIS — G8929 Other chronic pain: Secondary | ICD-10-CM | POA: Diagnosis not present

## 2022-12-28 DIAGNOSIS — M25551 Pain in right hip: Secondary | ICD-10-CM | POA: Diagnosis not present

## 2022-12-28 DIAGNOSIS — M25552 Pain in left hip: Secondary | ICD-10-CM | POA: Diagnosis not present

## 2022-12-28 DIAGNOSIS — M533 Sacrococcygeal disorders, not elsewhere classified: Secondary | ICD-10-CM | POA: Diagnosis not present

## 2022-12-28 DIAGNOSIS — M542 Cervicalgia: Secondary | ICD-10-CM | POA: Diagnosis not present

## 2022-12-28 DIAGNOSIS — M7918 Myalgia, other site: Secondary | ICD-10-CM | POA: Diagnosis not present

## 2022-12-31 DIAGNOSIS — M542 Cervicalgia: Secondary | ICD-10-CM | POA: Diagnosis not present

## 2022-12-31 DIAGNOSIS — M25552 Pain in left hip: Secondary | ICD-10-CM | POA: Diagnosis not present

## 2022-12-31 DIAGNOSIS — M5459 Other low back pain: Secondary | ICD-10-CM | POA: Diagnosis not present

## 2022-12-31 DIAGNOSIS — M25551 Pain in right hip: Secondary | ICD-10-CM | POA: Diagnosis not present

## 2023-01-04 ENCOUNTER — Encounter: Payer: Self-pay | Admitting: Family Medicine

## 2023-01-04 ENCOUNTER — Telehealth (INDEPENDENT_AMBULATORY_CARE_PROVIDER_SITE_OTHER): Payer: BC Managed Care – PPO | Admitting: Family Medicine

## 2023-01-04 DIAGNOSIS — K219 Gastro-esophageal reflux disease without esophagitis: Secondary | ICD-10-CM | POA: Diagnosis not present

## 2023-01-04 DIAGNOSIS — K760 Fatty (change of) liver, not elsewhere classified: Secondary | ICD-10-CM

## 2023-01-04 DIAGNOSIS — M7071 Other bursitis of hip, right hip: Secondary | ICD-10-CM

## 2023-01-04 DIAGNOSIS — M3505 Sjogren syndrome with inflammatory arthritis: Secondary | ICD-10-CM

## 2023-01-04 DIAGNOSIS — M797 Fibromyalgia: Secondary | ICD-10-CM

## 2023-01-04 DIAGNOSIS — M7072 Other bursitis of hip, left hip: Secondary | ICD-10-CM

## 2023-01-04 DIAGNOSIS — M2550 Pain in unspecified joint: Secondary | ICD-10-CM

## 2023-01-04 DIAGNOSIS — R32 Unspecified urinary incontinence: Secondary | ICD-10-CM

## 2023-01-04 DIAGNOSIS — G8929 Other chronic pain: Secondary | ICD-10-CM

## 2023-01-04 MED ORDER — PANTOPRAZOLE SODIUM 40 MG PO TBEC
40.0000 mg | DELAYED_RELEASE_TABLET | Freq: Every day | ORAL | 1 refills | Status: AC
Start: 2023-01-04 — End: ?

## 2023-01-04 MED ORDER — HYDROCODONE-ACETAMINOPHEN 5-325 MG PO TABS: 1.0000 | ORAL_TABLET | Freq: Four times a day (QID) | ORAL | 0 refills | Status: AC | PRN

## 2023-01-04 NOTE — Progress Notes (Signed)
"  Patient was unable to self-report due to a lack of equipment at home via telehealth"

## 2023-01-04 NOTE — Progress Notes (Signed)
Virtual Visit via Video Note  I connected with Abigail Gomez on 01/04/23 at  3:30 PM EDT by a video enabled telemedicine application and verified that I am speaking with the correct person using two identifiers.  Location patient: home Location provider:work or home office Persons participating in the virtual visit: patient, provider  I discussed the limitations of evaluation and management by telemedicine and the availability of in person appointments. The patient expressed understanding and agreed to proceed. Chief Complaint  Patient presents with   Medical Management of Chronic Issues    Lab results and Pain management     HPI: Pt is a 57 yo female seen for f/u on chronic conditions.  Patient still going back and forth from [place], [place] and [place] as she is caring for her mother.  Pt had appt with pain management, typically sees Dr. Hetty Ely with [facility] , but he was unavailable at last visit.  Patient states the new provider said there was nothing further that could be offered and patient should avoid continued steroid injections.  Pt had an illiopsoas and SI injections in September.  The SI injections caused pain immediately after and did not work this time.  Patient continuing acupuncture, chiropractic manipulation, massage, sauna use.  Pt frustrated with chronic pain and limited options.  Patient has undergone numerous surgeries including bilateral hip replacements, double bunionectomy x 2.  In the past had weekly lidocaine gtt  with [facility] anest[facility].      Pt states she was never officially dx'd with Lyme dz, but recalls having a bullseye rash in grade school.  Pt states she told her mom at the time, but was not evaluated.  Pt states she later developed Bell's palsy in 1996.  Lyme titer in 2023 negative.  Seen at ID at [facility] and [facility].  Incontinence since 2006.  Has appointment with uro-Gyn in a few weeks for possible trial of device similar to bladder stim.  H/o  chiari malformation s/p surgery.  Pt has an appt with Dr. Julius Bowels, Ortho.  Waiting on referral to another specialist to see if candidate for spinal cord stimulator.  Reviewed recent lab results with patient.  Mild elevation in ALT and AST noted likely 2/2 history of hepatic steatosis.  Patient states she has never been told this information.  Advised was noted on RUQ ultrasound 11/17/2020.  Patient was concerned about elevated total cholesterol at 102.  ROS: See pertinent positives and negatives per HPI.  Past Medical History:  Diagnosis Date   Allergy    Anemia    Anxiety    Depression    Disease    lymes disease   Fibromyalgia    H/o Lyme disease    H/O sickle cell trait    History of pelvic fracture    History of urinary incontinence    Hypertension    Hypothyroidism    Insomnia    Labral tear of hip joint    Right Hip   Migraine    Osteoarthritis    RLS (restless legs syndrome)    Sleep apnea    cpap   Spinal stenosis    Thyroid disease    TMJ syndrome    Vitamin D deficiency     Past Surgical History:  Procedure Laterality Date   ABDOMINAL HYSTERECTOMY     BUNIONECTOMY Bilateral    chiari malformation     ENDOMETRIAL ABLATION     MYOMECTOMY     OOPHORECTOMY     TONSILLECTOMY AND  ADENOIDECTOMY     TOTAL HIP ARTHROPLASTY Right 02/28/2016   TOTAL HIP ARTHROPLASTY Right 02/28/2016   Procedure: RIGHT TOTAL HIP ARTHROPLASTY ANTERIOR APPROACH;  Surgeon: Kathryne Hitch, MD;  Location: [facility] OR;  Service: Orthopedics;  Laterality: Right;    Family History  Problem Relation Age of Onset   Hyperlipidemia Mother    Diabetes Father    Hypertension Father    Cancer Father        prostate   Diabetes Sister    Hypertension Sister    Arthritis Maternal Grandmother    Stroke Maternal Grandmother    Hypertension Maternal Grandmother    Hyperlipidemia Maternal Grandmother    Hyperkalemia Maternal Grandmother    Heart disease Maternal Grandmother    Arthritis  Paternal Grandmother    Diabetes Sister     Current Outpatient Medications:    ALPRAZolam (XANAX) 0.5 MG tablet, Take 0.5 mg by mouth at bedtime as needed for anxiety., Disp: , Rfl:    amitriptyline (ELAVIL) 10 MG tablet, Take 4 tablets (40 mg total) by mouth at bedtime., Disp: 360 tablet, Rfl: 3   ARMOUR THYROID 30 MG tablet, TAKE 1 TABLET BY MOUTH EVERY DAY, Disp: 90 tablet, Rfl: 1   cholecalciferol (VITAMIN D) 1000 units tablet, Take 5,000 Units by mouth daily. , Disp: , Rfl:    cyclobenzaprine (FLEXERIL) 10 MG tablet, TAKE 1 TABLET BY MOUTH THREE TIMES A DAY AS NEEDED FOR MUSCLE SPASMS, Disp: 90 tablet, Rfl: 5   diazepam (VALIUM) 10 MG tablet, Take 10 mg by mouth at bedtime. Vaginally, Disp: , Rfl:    estradiol (CLIMARA - DOSED IN MG/24 HR) 0.1 mg/24hr patch, Place 0.1 mg onto the skin 2 (two) times a week., Disp: , Rfl:    fluconazole (DIFLUCAN) 150 MG tablet, Take 1 tab now.  Repeat dose in 3 days if needed., Disp: 3 tablet, Rfl: 0   lidocaine (LIDODERM) 5 %, PLACE 1 PATCH ONTO THE SKIN DAILY. REMOVE & DISCARD PATCH WITHIN 12 HOURS OR AS DIRECTED BY MD, Disp: 90 patch, Rfl: 3   losartan-hydrochlorothiazide (HYZAAR) 100-25 MG tablet, TAKE 1/2 TABLET BY MOUTH DAILY, Disp: 45 tablet, Rfl: 1   Lurasidone HCl (LATUDA PO), Take 30 mg by mouth daily., Disp: , Rfl:    Magnesium Amino Acid Chelate 20 % POWD, Take 450 mg by mouth at bedtime., Disp: , Rfl:    Melatonin 10 MG TABS, Take 10 mg by mouth at bedtime. , Disp: , Rfl:    metoprolol tartrate (LOPRESSOR) 50 MG tablet, Take 1 tablet (50 mg total) by mouth 2 (two) times daily., Disp: 180 tablet, Rfl: 3   nitrofurantoin, macrocrystal-monohydrate, (MACROBID) 100 MG capsule, Take 100 mg by mouth., Disp: , Rfl:    rizatriptan (MAXALT-MLT) 10 MG disintegrating tablet, DISSOLVE 1 TABLET BY MOUTH DAILY AS NEEDED FOR MIGRAINE, Disp: 10 tablet, Rfl: 4   sulfamethoxazole-trimethoprim (BACTRIM DS) 800-160 MG tablet, Take 1 tablet by mouth. One tablet  before having intercourse., Disp: , Rfl:    Vibegron (GEMTESA) 75 MG TABS, Take 75 mg by mouth daily., Disp: , Rfl:    vortioxetine HBr (TRINTELLIX) 20 MG TABS tablet, Take 20 mg by mouth daily. , Disp: , Rfl:    zolpidem (AMBIEN CR) 6.25 MG CR tablet, Take 12.5 mg by mouth at bedtime as needed for sleep., Disp: , Rfl:    zonisamide (ZONEGRAN) 50 MG capsule, Take 150 mg by mouth at bedtime., Disp: , Rfl:   EXAM:  VITALS per patient if  applicable: RR between 12-20 bpm  GENERAL: alert, oriented, appears well and in no acute distress  HEENT: atraumatic, conjunctiva clear, no obvious abnormalities on inspection of external nose and ears  NECK: normal movements of the head and neck  LUNGS: on inspection no signs of respiratory distress, breathing rate appears normal, no obvious gross SOB, gasping or wheezing  CV: no obvious cyanosis  MS: moves all visible extremities without noticeable abnormality  PSYCH/NEURO: pleasant and cooperative, no obvious depression or anxiety, speech and thought processing grossly intact  ASSESSMENT AND PLAN:  Discussed the following assessment and plan:  Chronic joint pain - Plan: HYDROcodone-acetaminophen (NORCO) 5-325 MG tablet  Fibromyalgia - Plan: HYDROcodone-acetaminophen (NORCO) 5-325 MG tablet  Gastroesophageal reflux disease, unspecified whether esophagitis present - Plan: pantoprazole (PROTONIX) 40 MG tablet  Hepatic steatosis  Sjogren's syndrome with inflammatory arthritis (HCC)  Iliopsoas bursitis of both hips  Urinary incontinence, unspecified type  Patient with complex medical history involving chronic pain and joint issues concerning for autoimmune disorder.  ANA negative on 12/19/2022 but has been positive as recent as 01/17/2022 with nuclear fine speckled pattern noted.  Patient encouraged to keep upcoming follow-up appointments with specialist.  Will consider second opinion at [facility].  If no further recommendations offered  discussed referral to specialist at [facility] or [facility].  Given limited one-time supply of Norco for current symptoms.  Restart Protonix for increased acid reflux symptoms.  Continue to monitor mildly elevated LFTs likely due to history of hepatic steatosis noted on RUQ ultrasound from 2022.   I discussed the assessment and treatment plan with the patient. The patient was provided an opportunity to ask questions and all were answered. The patient agreed with the plan and demonstrated an understanding of the instructions.   The patient was advised to call back or seek an in-person evaluation if the symptoms worsen or if the condition fails to improve as anticipated.   On day of service, 33 minutes spent caring for this patient face-to-face, reviewing the chart, counseling and/or coordinating care for plan and treatment of diagnosis below.    Deeann Saint, MD

## 2023-01-09 ENCOUNTER — Telehealth: Payer: Self-pay | Admitting: Family Medicine

## 2023-01-09 DIAGNOSIS — M5459 Other low back pain: Secondary | ICD-10-CM | POA: Diagnosis not present

## 2023-01-09 DIAGNOSIS — M25551 Pain in right hip: Secondary | ICD-10-CM | POA: Diagnosis not present

## 2023-01-09 DIAGNOSIS — M545 Low back pain, unspecified: Secondary | ICD-10-CM | POA: Diagnosis not present

## 2023-01-09 DIAGNOSIS — M797 Fibromyalgia: Secondary | ICD-10-CM | POA: Diagnosis not present

## 2023-01-09 DIAGNOSIS — M542 Cervicalgia: Secondary | ICD-10-CM | POA: Diagnosis not present

## 2023-01-09 DIAGNOSIS — R5383 Other fatigue: Secondary | ICD-10-CM | POA: Diagnosis not present

## 2023-01-09 DIAGNOSIS — M1612 Unilateral primary osteoarthritis, left hip: Secondary | ICD-10-CM | POA: Diagnosis not present

## 2023-01-09 DIAGNOSIS — M25552 Pain in left hip: Secondary | ICD-10-CM | POA: Diagnosis not present

## 2023-01-09 NOTE — Telephone Encounter (Signed)
Pt is calling to let dr banks know she saw rheumatologist at [facility] today and he said he could not help her with any of her issues and that  dr banks could continue to do her blood work for fibromyalgia. Please advise

## 2023-01-16 DIAGNOSIS — Z96643 Presence of artificial hip joint, bilateral: Secondary | ICD-10-CM | POA: Diagnosis not present

## 2023-01-16 DIAGNOSIS — M542 Cervicalgia: Secondary | ICD-10-CM | POA: Diagnosis not present

## 2023-01-16 DIAGNOSIS — M48061 Spinal stenosis, lumbar region without neurogenic claudication: Secondary | ICD-10-CM | POA: Diagnosis not present

## 2023-01-16 DIAGNOSIS — Z96649 Presence of unspecified artificial hip joint: Secondary | ICD-10-CM | POA: Diagnosis not present

## 2023-01-16 DIAGNOSIS — M5459 Other low back pain: Secondary | ICD-10-CM | POA: Diagnosis not present

## 2023-01-16 DIAGNOSIS — M25551 Pain in right hip: Secondary | ICD-10-CM | POA: Diagnosis not present

## 2023-01-16 DIAGNOSIS — M25552 Pain in left hip: Secondary | ICD-10-CM | POA: Diagnosis not present

## 2023-01-17 DIAGNOSIS — M48062 Spinal stenosis, lumbar region with neurogenic claudication: Secondary | ICD-10-CM | POA: Diagnosis not present

## 2023-01-17 DIAGNOSIS — M5459 Other low back pain: Secondary | ICD-10-CM | POA: Diagnosis not present

## 2023-01-17 DIAGNOSIS — M25551 Pain in right hip: Secondary | ICD-10-CM | POA: Diagnosis not present

## 2023-01-17 DIAGNOSIS — M542 Cervicalgia: Secondary | ICD-10-CM | POA: Diagnosis not present

## 2023-01-17 DIAGNOSIS — M25552 Pain in left hip: Secondary | ICD-10-CM | POA: Diagnosis not present

## 2023-01-17 DIAGNOSIS — M5440 Lumbago with sciatica, unspecified side: Secondary | ICD-10-CM | POA: Diagnosis not present

## 2023-01-18 DIAGNOSIS — M5459 Other low back pain: Secondary | ICD-10-CM | POA: Diagnosis not present

## 2023-01-18 DIAGNOSIS — M542 Cervicalgia: Secondary | ICD-10-CM | POA: Diagnosis not present

## 2023-01-18 DIAGNOSIS — M25552 Pain in left hip: Secondary | ICD-10-CM | POA: Diagnosis not present

## 2023-01-18 DIAGNOSIS — M25551 Pain in right hip: Secondary | ICD-10-CM | POA: Diagnosis not present

## 2023-01-21 DIAGNOSIS — M25551 Pain in right hip: Secondary | ICD-10-CM | POA: Diagnosis not present

## 2023-01-21 DIAGNOSIS — M542 Cervicalgia: Secondary | ICD-10-CM | POA: Diagnosis not present

## 2023-01-21 DIAGNOSIS — M25552 Pain in left hip: Secondary | ICD-10-CM | POA: Diagnosis not present

## 2023-01-21 DIAGNOSIS — M5459 Other low back pain: Secondary | ICD-10-CM | POA: Diagnosis not present

## 2023-01-22 DIAGNOSIS — Z1272 Encounter for screening for malignant neoplasm of vagina: Secondary | ICD-10-CM | POA: Diagnosis not present

## 2023-01-22 DIAGNOSIS — Z131 Encounter for screening for diabetes mellitus: Secondary | ICD-10-CM | POA: Diagnosis not present

## 2023-01-22 DIAGNOSIS — Z1231 Encounter for screening mammogram for malignant neoplasm of breast: Secondary | ICD-10-CM | POA: Diagnosis not present

## 2023-01-22 DIAGNOSIS — Z6823 Body mass index (BMI) 23.0-23.9, adult: Secondary | ICD-10-CM | POA: Diagnosis not present

## 2023-01-22 DIAGNOSIS — Z113 Encounter for screening for infections with a predominantly sexual mode of transmission: Secondary | ICD-10-CM | POA: Diagnosis not present

## 2023-01-22 DIAGNOSIS — Z01419 Encounter for gynecological examination (general) (routine) without abnormal findings: Secondary | ICD-10-CM | POA: Diagnosis not present

## 2023-02-04 DIAGNOSIS — M542 Cervicalgia: Secondary | ICD-10-CM | POA: Diagnosis not present

## 2023-02-04 DIAGNOSIS — M25552 Pain in left hip: Secondary | ICD-10-CM | POA: Diagnosis not present

## 2023-02-04 DIAGNOSIS — M5459 Other low back pain: Secondary | ICD-10-CM | POA: Diagnosis not present

## 2023-02-04 DIAGNOSIS — M25551 Pain in right hip: Secondary | ICD-10-CM | POA: Diagnosis not present

## 2023-02-05 DIAGNOSIS — M25551 Pain in right hip: Secondary | ICD-10-CM | POA: Diagnosis not present

## 2023-02-05 DIAGNOSIS — M542 Cervicalgia: Secondary | ICD-10-CM | POA: Diagnosis not present

## 2023-02-05 DIAGNOSIS — M5459 Other low back pain: Secondary | ICD-10-CM | POA: Diagnosis not present

## 2023-02-05 DIAGNOSIS — M25552 Pain in left hip: Secondary | ICD-10-CM | POA: Diagnosis not present

## 2023-02-06 ENCOUNTER — Telehealth: Payer: Self-pay | Admitting: Family Medicine

## 2023-02-06 NOTE — Telephone Encounter (Signed)
Pt is calling and would like a new rx for hydrocodone 5-325 mg please send to  [id number] [place], [place] - [address] Phone: [phone]  Fax: [phone]

## 2023-02-11 DIAGNOSIS — K219 Gastro-esophageal reflux disease without esophagitis: Secondary | ICD-10-CM | POA: Diagnosis not present

## 2023-02-11 DIAGNOSIS — E039 Hypothyroidism, unspecified: Secondary | ICD-10-CM | POA: Diagnosis not present

## 2023-02-11 DIAGNOSIS — Z79899 Other long term (current) drug therapy: Secondary | ICD-10-CM | POA: Diagnosis not present

## 2023-02-11 DIAGNOSIS — N3281 Overactive bladder: Secondary | ICD-10-CM | POA: Diagnosis not present

## 2023-02-11 DIAGNOSIS — I10 Essential (primary) hypertension: Secondary | ICD-10-CM | POA: Diagnosis not present

## 2023-02-12 DIAGNOSIS — M4316 Spondylolisthesis, lumbar region: Secondary | ICD-10-CM | POA: Diagnosis not present

## 2023-02-12 DIAGNOSIS — M48062 Spinal stenosis, lumbar region with neurogenic claudication: Secondary | ICD-10-CM | POA: Diagnosis not present

## 2023-02-12 NOTE — Telephone Encounter (Signed)
Spoke with patient, Dr. Salomon Fick is out of the office today, and I will update her when she is back. Patient is aware

## 2023-02-12 NOTE — Telephone Encounter (Signed)
Pt checking on progress of this refill request

## 2023-02-14 ENCOUNTER — Other Ambulatory Visit: Payer: Self-pay | Admitting: Family Medicine

## 2023-02-14 DIAGNOSIS — M1611 Unilateral primary osteoarthritis, right hip: Secondary | ICD-10-CM

## 2023-02-14 DIAGNOSIS — G8929 Other chronic pain: Secondary | ICD-10-CM

## 2023-02-14 MED ORDER — HYDROCODONE-ACETAMINOPHEN 5-325 MG PO TABS: 1.0000 | ORAL_TABLET | Freq: Four times a day (QID) | ORAL | 0 refills | Status: DC | PRN

## 2023-02-14 NOTE — Telephone Encounter (Signed)
Called and spoke with patient, medication has been sent over to the pharmacy

## 2023-02-14 NOTE — Telephone Encounter (Signed)
Done

## 2023-02-14 NOTE — Telephone Encounter (Signed)
Patient checking on refill  

## 2023-02-15 ENCOUNTER — Other Ambulatory Visit: Payer: Self-pay | Admitting: Family Medicine

## 2023-02-15 DIAGNOSIS — I10 Essential (primary) hypertension: Secondary | ICD-10-CM

## 2023-02-18 DIAGNOSIS — N393 Stress incontinence (female) (male): Secondary | ICD-10-CM | POA: Diagnosis not present

## 2023-02-18 DIAGNOSIS — Z9889 Other specified postprocedural states: Secondary | ICD-10-CM | POA: Diagnosis not present

## 2023-02-25 DIAGNOSIS — R350 Frequency of micturition: Secondary | ICD-10-CM | POA: Diagnosis not present

## 2023-02-25 DIAGNOSIS — I10 Essential (primary) hypertension: Secondary | ICD-10-CM | POA: Diagnosis not present

## 2023-02-25 DIAGNOSIS — Z79899 Other long term (current) drug therapy: Secondary | ICD-10-CM | POA: Diagnosis not present

## 2023-02-25 DIAGNOSIS — N3281 Overactive bladder: Secondary | ICD-10-CM | POA: Diagnosis not present

## 2023-02-25 DIAGNOSIS — R3915 Urgency of urination: Secondary | ICD-10-CM | POA: Diagnosis not present

## 2023-02-25 DIAGNOSIS — E039 Hypothyroidism, unspecified: Secondary | ICD-10-CM | POA: Diagnosis not present

## 2023-02-25 DIAGNOSIS — M797 Fibromyalgia: Secondary | ICD-10-CM | POA: Diagnosis not present

## 2023-02-25 DIAGNOSIS — K219 Gastro-esophageal reflux disease without esophagitis: Secondary | ICD-10-CM | POA: Diagnosis not present

## 2023-02-27 NOTE — Telephone Encounter (Signed)
Ok

## 2023-03-01 DIAGNOSIS — H40013 Open angle with borderline findings, low risk, bilateral: Secondary | ICD-10-CM | POA: Diagnosis not present

## 2023-03-07 DIAGNOSIS — Z9889 Other specified postprocedural states: Secondary | ICD-10-CM | POA: Diagnosis not present

## 2023-03-07 DIAGNOSIS — N3281 Overactive bladder: Secondary | ICD-10-CM | POA: Diagnosis not present

## 2023-03-07 DIAGNOSIS — Z13820 Encounter for screening for osteoporosis: Secondary | ICD-10-CM | POA: Diagnosis not present

## 2023-03-11 DIAGNOSIS — M48062 Spinal stenosis, lumbar region with neurogenic claudication: Secondary | ICD-10-CM | POA: Diagnosis not present

## 2023-03-23 NOTE — Progress Notes (Deleted)
 03/25/23- 57 yoF for sleep evaluation. Last see in 2023 by Dr Neda with complaint of severe EDS. Past hx mild OSA 7.7/hr, intolerant of CPAP. Medical problem list includes Migraine, HTN, ASCVD/PAD Leg, GERD, Hypothyroid, Sjogrens, Osteoarthritis, DI?M?S, Hypersomnia, Hyperlipidemia NPSG 02/27/22- AHI 0.2/hr, desat to 92%, body weight 165 lbs Epworth score- Body weight today Medication list includes alprazolam , valium, ambien , flexeril , Norco, melatonin

## 2023-03-25 ENCOUNTER — Ambulatory Visit: Payer: BC Managed Care – PPO | Admitting: Internal Medicine

## 2023-03-25 ENCOUNTER — Encounter: Payer: Self-pay | Admitting: Internal Medicine

## 2023-03-26 DIAGNOSIS — M48062 Spinal stenosis, lumbar region with neurogenic claudication: Secondary | ICD-10-CM | POA: Diagnosis not present

## 2023-03-28 DIAGNOSIS — F331 Major depressive disorder, recurrent, moderate: Secondary | ICD-10-CM | POA: Diagnosis not present

## 2023-04-11 DIAGNOSIS — Z96643 Presence of artificial hip joint, bilateral: Secondary | ICD-10-CM | POA: Diagnosis not present

## 2023-06-06 DIAGNOSIS — F3132 Bipolar disorder, current episode depressed, moderate: Secondary | ICD-10-CM | POA: Diagnosis not present

## 2023-06-06 DIAGNOSIS — F331 Major depressive disorder, recurrent, moderate: Secondary | ICD-10-CM | POA: Diagnosis not present

## 2023-06-06 DIAGNOSIS — F605 Obsessive-compulsive personality disorder: Secondary | ICD-10-CM | POA: Diagnosis not present

## 2023-06-06 DIAGNOSIS — F411 Generalized anxiety disorder: Secondary | ICD-10-CM | POA: Diagnosis not present

## 2023-06-24 ENCOUNTER — Ambulatory Visit: Payer: Self-pay

## 2023-06-24 NOTE — Telephone Encounter (Signed)
 Chief Complaint: right hip pain Symptoms: right hip pain Frequency: x 2 weeks Pertinent Negatives: Patient denies fever Disposition: [] ED /[] Urgent Care (no appt availability in office) / [x] Appointment(In office/virtual)/ []  [place] [facility]/ [] Home Care/ [x] Refused Recommended Disposition /[] [place] [facility]/ [x]  Follow-up with PCP Additional Notes: pt states that she has had hip surgery multiple times and is still having mod-severe pain in her right hip. States the pain is the grinding pain and is made worse by walking. States yesterday she had to get a wheelchair while walking around the store, ibuprophen not helping. States pain has been x 2 weeks and Celebrex and Neurontin not working either. States that something is wrong as she is taking multiple medications at one time without relief. Requesting a cane and a Rolator so she can have sitting support when walking.  Patient states that she is also needing an MRI and is desperate and needing help, doesn't want to go to sleep. Pt doesn't want an appt as first available on Thursday and does not want to see anyone else and states that she will go to ER at [facility] if needed.   Copied from CRM [id number]. Topic: Clinical - Red Word Triage >> Jun 24, 2023  3:43 PM Dorisann Garre T wrote: Red Word that prompted transfer to Nurse Triage: patient is in chronic pain her hip is giving her a hard time she is unable to walk at time due to this chronic pain she says it is stabbing aching sharp all the above she has had four hip surgeries  and is needing help asap she had back surgery as well she would like help today regarding this back she can no longer cope with this pain Answer Assessment - Initial Assessment Questions 1. LOCATION and RADIATION: "Where is the pain located?"      Right hip 2. QUALITY: "What does the pain feel like?"  (e.g., sharp, dull, aching, burning)     Grinding pain 3. SEVERITY: "How bad is the pain?" "What does it keep you from  doing?"   (Scale 1-10; or mild, moderate, severe)   -  MILD (1-3): doesn't interfere with normal activities    -  MODERATE (4-7): interferes with normal activities (e.g., work or school) or awakens from sleep, limping    -  SEVERE (8-10): excruciating pain, unable to do any normal activities, unable to walk     Mod-severe 4. ONSET: "When did the pain start?" "Does it come and go, or is it there all the time?"     Comes and goes 5. WORK OR EXERCISE: "Has there been any recent work or exercise that involved this part of the body?"      Hip surgery multiple times 6. CAUSE: "What do you think is causing the hip pain?"      Chronic pain 7. AGGRAVATING FACTORS: "What makes the hip pain worse?" (e.g., walking, climbing stairs, running)     walking  Protocols used: Hip Pain-A-AH

## 2023-06-26 ENCOUNTER — Telehealth: Payer: Self-pay

## 2023-06-26 NOTE — Telephone Encounter (Signed)
 Looks like pt sees ortho for this - has MRI scheduled for 4/30 through [facility].

## 2023-06-26 NOTE — Telephone Encounter (Signed)
 Spoke with PCP, appt moved to tomorrow at 4:30pm in office.

## 2023-06-26 NOTE — Telephone Encounter (Signed)
 Copied from CRM [id number]. Topic: Clinical - Red Word Triage >> Jun 26, 2023  2:40 PM [name] wrote: [facility] that prompted transfer to Nurse Triage: Altered mental health. Patient states she is going through depression, has suicidal thoughts, very irate on the phone towards agent. Stated multiple times she does not want to speak with a nurse after agent advised that would be the best route for guidance on this. Patient specifically wanted an appointment scheduled with PCP to discuss getting a walker and rollator, due to right foot issues, agent scheduled this visit for patient. Wanted to make nurses aware that patient denied nurse triage x3.

## 2023-06-26 NOTE — Telephone Encounter (Signed)
 Called [facility] speaking to [place] and advised of the CRM that was sent to the clinical pool high priority for patient mentioned per E2C2 Patient Access Specialist suicidal thoughts and the refusal to speak to [place] NT. She says she will call and let Isa Manuel know.

## 2023-06-27 ENCOUNTER — Ambulatory Visit (INDEPENDENT_AMBULATORY_CARE_PROVIDER_SITE_OTHER): Admitting: Family Medicine

## 2023-06-27 ENCOUNTER — Encounter: Payer: Self-pay | Admitting: Family Medicine

## 2023-06-27 VITALS — BP 140/90 | HR 62 | Temp 98.7°F

## 2023-06-27 DIAGNOSIS — M545 Low back pain, unspecified: Secondary | ICD-10-CM

## 2023-06-27 DIAGNOSIS — Z96641 Presence of right artificial hip joint: Secondary | ICD-10-CM | POA: Diagnosis not present

## 2023-06-27 DIAGNOSIS — Z96643 Presence of artificial hip joint, bilateral: Secondary | ICD-10-CM

## 2023-06-27 DIAGNOSIS — R29898 Other symptoms and signs involving the musculoskeletal system: Secondary | ICD-10-CM | POA: Diagnosis not present

## 2023-06-27 DIAGNOSIS — Z9682 Presence of neurostimulator: Secondary | ICD-10-CM

## 2023-06-27 DIAGNOSIS — M4800 Spinal stenosis, site unspecified: Secondary | ICD-10-CM

## 2023-06-27 DIAGNOSIS — G8929 Other chronic pain: Secondary | ICD-10-CM

## 2023-06-27 NOTE — Progress Notes (Signed)
 Established Patient Office Visit   Subjective  Patient ID: Abigail Gomez, female    DOB: 11/01/1965  Age: 58 y.o. MRN: [id number]  Chief Complaint  Patient presents with   Pain Management    Walker and rolator consult    Patient is a 58 year old female seen for follow-up on chronic conditions.  Patient endorses increased pain in right hip/leg.  Feels like a grinding, sharp sensation in hip.  Patient states hip locked up on her while in a store causing her to fall on 2 different occasions.  Unable to have an iliopsoas injection until May 20.  Current medications are not helping.  Patient tried increasing meds with no improvement in symptoms.  Patient has also been looking into alternative medicine options.  Current symptoms and life stress causing increased depression.  Scheduled to have an MRI but will need to have bladder stimulator turned to MRI setting.  Patient was unable to get an appointment with previous Ortho providers/a prescription for rollator.    Patient Active Problem List   Diagnosis Date Noted   Sjogren's syndrome with inflammatory arthritis (HCC) 01/04/2023   Iliopsoas bursitis of both hips 01/04/2023   Urinary incontinence 01/04/2023   Physical exam 08/31/2016   Hyperlipidemia 02/28/2016   Osteoarthritis of right hip 02/28/2016   Status post total replacement of right hip 02/28/2016   Obstructive sleep apnea syndrome 02/01/2016   Pain of right hip joint 02/01/2016   Hypertension 01/31/2016   Restless leg syndrome 01/31/2016   Sickle cell trait (HCC) 01/31/2016   Gastroesophageal reflux 01/31/2016   History of bunionectomy of both great toes 01/31/2016   History of pelvic fracture 01/31/2016   Trochanteric bursitis, right hip 01/26/2016   Anxiety and depression 09/19/2015   Fibromyalgia 09/19/2015   Migraines 09/19/2015   Hypothyroidism 09/19/2015   Insomnia 08/20/2015   Hypersomnia 08/20/2015   Depression 08/20/2015   Past Medical History:  Diagnosis  Date   Allergy    Anemia    Anxiety    Depression    Disease    lymes disease   Fibromyalgia    H/o Lyme disease    H/O sickle cell trait    History of pelvic fracture    History of urinary incontinence    Hypertension    Hypothyroidism    Insomnia    Labral tear of hip joint    Right Hip   Migraine    Osteoarthritis    RLS (restless legs syndrome)    Sleep apnea    cpap   Spinal stenosis    Thyroid  disease    TMJ syndrome    Vitamin D  deficiency    Past Surgical History:  Procedure Laterality Date   ABDOMINAL HYSTERECTOMY     BUNIONECTOMY Bilateral    chiari malformation     ENDOMETRIAL ABLATION     MYOMECTOMY     OOPHORECTOMY     TONSILLECTOMY AND ADENOIDECTOMY     TOTAL HIP ARTHROPLASTY Right 02/28/2016   TOTAL HIP ARTHROPLASTY Right 02/28/2016   Procedure: RIGHT TOTAL HIP ARTHROPLASTY ANTERIOR APPROACH;  Surgeon: Arnie Lao, MD;  Location: [facility] OR;  Service: Orthopedics;  Laterality: Right;   Social History   Tobacco Use   Smoking status: Never   Smokeless tobacco: Never  Vaping Use   Vaping status: Never Used  Substance Use Topics   Alcohol use: Yes    Alcohol/week: 0.0 standard drinks of alcohol    Comment: occ   Drug use: No  Family History  Problem Relation Age of Onset   Hyperlipidemia Mother    Diabetes Father    Hypertension Father    Cancer Father        prostate   Diabetes Sister    Hypertension Sister    Arthritis Maternal Grandmother    Stroke Maternal Grandmother    Hypertension Maternal Grandmother    Hyperlipidemia Maternal Grandmother    Hyperkalemia Maternal Grandmother    Heart disease Maternal Grandmother    Arthritis Paternal Grandmother    Diabetes Sister    Allergies  Allergen Reactions   Peanut Oil Anaphylaxis   Penicillins Other (See Comments) and Anaphylaxis    Syncope Has patient had a PCN reaction causing immediate rash, facial/tongue/throat swelling, SOB or lightheadedness with hypotension:Yes Has  patient had a PCN reaction causing severe rash involving mucus membranes or skin necrosis:No Has patient had a PCN reaction that required hospitalization:No Has patient had a PCN reaction occurring within the last 10 years:No If all of the above answers are "NO", then may proceed with Cephalosporin use.    Food     Nuts-itching/swelling Bananas-itching/swelling   Lactose Intolerance (Gi) Other (See Comments)    G.I. upset   Lactulose Other (See Comments)    G.I. upset   Lisinopril Cough   Other Itching    Nuts-itching/swelling Bananas-itching/swelling   Oxycodone -Acetaminophen  Itching and Other (See Comments)   Allyl Isothiocyanate Hives and Rash   Banana Rash      ROS Negative unless stated above    Objective:     BP (!) 140/90 (BP Location: Left Arm, Patient Position: Sitting, Cuff Size: Normal)   Pulse 62   Temp 98.7 F (37.1 C) (Oral)   SpO2 97%  BP Readings from Last 3 Encounters:  07/03/23 (!) 161/97  06/27/23 (!) 140/90  12/19/22 (!) 140/80   Wt Readings from Last 3 Encounters:  12/19/22 151 lb 9.6 oz (68.8 kg)  10/11/22 152 lb (68.9 kg)  05/24/22 152 lb 6.4 oz (69.1 kg)    Physical Exam Constitutional:      General: She is not in acute distress.    Appearance: Normal appearance.     Comments: Appears uncomfortable sitting in chair.  HENT:     Head: Normocephalic and atraumatic.     Nose: Nose normal.     Mouth/Throat:     Mouth: Mucous membranes are moist.  Cardiovascular:     Rate and Rhythm: Normal rate and regular rhythm.     Heart sounds: Normal heart sounds. No murmur heard.    No gallop.  Pulmonary:     Effort: Pulmonary effort is normal. No respiratory distress.     Breath sounds: Normal breath sounds. No wheezing, rhonchi or rales.  Skin:    General: Skin is warm and dry.  Neurological:     Mental Status: She is alert and oriented to person, place, and time.     Motor: Weakness present.     Gait: Gait abnormal.     Comments:  Ambulating with cane.  Significant limp when ambulating.  Bilateral LE weakness right greater than left.        06/27/2023    5:27 PM 01/04/2023    2:51 PM 12/19/2022   11:46 AM  Depression screen PHQ 2/9  Decreased Interest 1 1   Down, Depressed, Hopeless 3 1 1   PHQ - 2 Score 4 2 1   Altered sleeping 3 1   Tired, decreased energy 2 1 1   Change in  appetite 3 3 3   Feeling bad or failure about yourself  2 0 1  Trouble concentrating 1 0 1  Moving slowly or fidgety/restless 1 1 1   Suicidal thoughts 1 0 0  PHQ-9 Score 17 8   Difficult doing work/chores Extremely dIfficult  Very difficult      06/27/2023    5:27 PM 12/19/2022   11:46 AM 10/11/2022    8:41 AM 04/04/2022    2:57 PM  GAD 7 : Generalized Anxiety Score  Nervous, Anxious, on Edge 3 1  1   Control/stop worrying 3 3 1 1   Worry too much - different things 3 3 1 1   Trouble relaxing 3 3 1 1   Restless 3 3 0 0  Easily annoyed or irritable 3 3 1    Afraid - awful might happen 3 3 1    Total GAD 7 Score 21 19    Anxiety Difficulty Extremely difficult Very difficult Very difficult Extremely difficult    No results found for any visits on 06/27/23.    Assessment & Plan:  Status post insertion of nerve stimulator -     For home use only DME 4 wheeled rolling walker with seat  History of revision of total replacement of right hip joint -     For home use only DME 4 wheeled rolling walker with seat  History of total replacement of both hip joints -     For home use only DME 4 wheeled rolling walker with seat  Chronic midline low back pain without sciatica -     For home use only DME 4 wheeled rolling walker with seat  Right leg weakness -     For home use only DME 4 wheeled rolling walker with seat  Spinal stenosis, unspecified spinal region  Patient with increased and chronic pain.  Followed by pain management however current medications are not helping.  Unable to receive a steroid injection until May 28.  Limited in  treatment options.  Patient is also under a pain contract.  Given significant change in gait since last seen Rx for rollator provided.  For increased or worsening symptoms proceed to nearest ED.   On day of service, 35 minutes spent caring for this patient face-to-face, reviewing the chart, counseling and/or coordinating care for plan and treatment of diagnosis below.    Return if symptoms worsen or fail to improve.   Viola Greulich, MD

## 2023-07-01 ENCOUNTER — Ambulatory Visit: Admitting: Family Medicine

## 2023-07-01 ENCOUNTER — Telehealth: Payer: Self-pay

## 2023-07-01 DIAGNOSIS — Z9682 Presence of neurostimulator: Secondary | ICD-10-CM

## 2023-07-01 DIAGNOSIS — M545 Low back pain, unspecified: Secondary | ICD-10-CM

## 2023-07-01 DIAGNOSIS — R29898 Other symptoms and signs involving the musculoskeletal system: Secondary | ICD-10-CM

## 2023-07-01 DIAGNOSIS — Z96643 Presence of artificial hip joint, bilateral: Secondary | ICD-10-CM

## 2023-07-01 DIAGNOSIS — Z96641 Presence of right artificial hip joint: Secondary | ICD-10-CM

## 2023-07-01 DIAGNOSIS — G8929 Other chronic pain: Secondary | ICD-10-CM

## 2023-07-01 NOTE — Telephone Encounter (Signed)
 I called and spoke with [facility]. Pt's insurance is out of network which is why it is requiring a PA. They will start the process and update the pt once a determination has been made; the insurance has anywhere from 14-21 days to make a decision.

## 2023-07-01 NOTE — Addendum Note (Signed)
 Addended by: Doll French on: 07/01/2023 02:37 PM   Modules accepted: Orders

## 2023-07-01 NOTE — Telephone Encounter (Signed)
 Copied from CRM [id number]. Topic: General - Other >> Jul 01, 2023  2:00 PM Adonis Hoot wrote: Reason for CRM: Patient called In stating that [facility] is needing a PA for Rolator due to her being tall ,and all of the other rolators are low to the ground. Lumex is the name of the rolater that she is needing the PA for that is tall enough for her since she just had back surgery. >> Jul 01, 2023  2:11 PM Juluis Ok wrote: Patient states she spoke with medical supply company and needs prescription rewritten to state" Rollator with walker and seat and brakes." Fax # [phone]

## 2023-07-01 NOTE — Telephone Encounter (Signed)
 Copied from CRM [id number]. Topic: General - Other >> Jul 01, 2023  2:00 PM Adonis Hoot wrote: Reason for CRM: Patient called In stating that [facility] is needing a PA for Rolator due to her being tall ,and all of the other rolators are low to the ground. Lumex is the name of the rolater that she is needing the PA for that is tall enough for her since she just had back surgery.

## 2023-07-01 NOTE — Telephone Encounter (Signed)
 Faxed new Rx over to [facility].

## 2023-07-03 ENCOUNTER — Encounter (HOSPITAL_BASED_OUTPATIENT_CLINIC_OR_DEPARTMENT_OTHER): Payer: Self-pay | Admitting: Emergency Medicine

## 2023-07-03 ENCOUNTER — Emergency Department (HOSPITAL_BASED_OUTPATIENT_CLINIC_OR_DEPARTMENT_OTHER): Admitting: Radiology

## 2023-07-03 ENCOUNTER — Emergency Department (HOSPITAL_BASED_OUTPATIENT_CLINIC_OR_DEPARTMENT_OTHER)

## 2023-07-03 ENCOUNTER — Other Ambulatory Visit: Payer: Self-pay

## 2023-07-03 ENCOUNTER — Emergency Department (HOSPITAL_BASED_OUTPATIENT_CLINIC_OR_DEPARTMENT_OTHER)
Admission: EM | Admit: 2023-07-03 | Discharge: 2023-07-04 | Disposition: A | Discharge status: Home / Self Care | Attending: Emergency Medicine | Admitting: Emergency Medicine

## 2023-07-03 DIAGNOSIS — Z96641 Presence of right artificial hip joint: Secondary | ICD-10-CM | POA: Insufficient documentation

## 2023-07-03 DIAGNOSIS — M25551 Pain in right hip: Secondary | ICD-10-CM | POA: Insufficient documentation

## 2023-07-03 DIAGNOSIS — Z79899 Other long term (current) drug therapy: Secondary | ICD-10-CM | POA: Insufficient documentation

## 2023-07-03 DIAGNOSIS — Z96642 Presence of left artificial hip joint: Secondary | ICD-10-CM | POA: Diagnosis not present

## 2023-07-03 LAB — CBC WITH DIFFERENTIAL/PLATELET
Abs Immature Granulocytes: 0.01 10*3/uL (ref 0.00–0.07)
Basophils Absolute: 0 10*3/uL (ref 0.0–0.1)
Basophils Relative: 0 %
Eosinophils Absolute: 0 10*3/uL (ref 0.0–0.5)
Eosinophils Relative: 1 %
HCT: 35.4 % — ABNORMAL LOW (ref 36.0–46.0)
Hemoglobin: 12.7 g/dL (ref 12.0–15.0)
Immature Granulocytes: 0 %
Lymphocytes Relative: 55 %
Lymphs Abs: 3.5 10*3/uL (ref 0.7–4.0)
MCH: 29.5 pg (ref 26.0–34.0)
MCHC: 35.9 g/dL (ref 30.0–36.0)
MCV: 82.1 fL (ref 80.0–100.0)
Monocytes Absolute: 0.7 10*3/uL (ref 0.1–1.0)
Monocytes Relative: 11 %
Neutro Abs: 2.2 10*3/uL (ref 1.7–7.7)
Neutrophils Relative %: 33 %
Platelets: 165 10*3/uL (ref 150–400)
RBC: 4.31 MIL/uL (ref 3.87–5.11)
RDW: 14.2 % (ref 11.5–15.5)
WBC: 6.5 10*3/uL (ref 4.0–10.5)
nRBC: 0 % (ref 0.0–0.2)

## 2023-07-03 MED ORDER — MORPHINE SULFATE (PF) 4 MG/ML IV SOLN
4.0000 mg | Freq: Once | INTRAVENOUS | Status: AC
  Administered 2023-07-03: 4 mg via INTRAVENOUS
  Filled 2023-07-03: qty 1

## 2023-07-03 MED ORDER — LIDOCAINE 5 % EX PTCH
1.0000 | MEDICATED_PATCH | Freq: Once | CUTANEOUS | Status: DC
Start: 2023-07-03 — End: 2023-07-04
  Administered 2023-07-03: 1 via TRANSDERMAL
  Filled 2023-07-03: qty 1

## 2023-07-03 MED ORDER — KETOROLAC TROMETHAMINE 15 MG/ML IJ SOLN
15.0000 mg | Freq: Once | INTRAMUSCULAR | Status: AC
  Administered 2023-07-03: 15 mg via INTRAVENOUS
  Filled 2023-07-03: qty 1

## 2023-07-03 NOTE — ED Provider Notes (Signed)
 [place] EMERGENCY DEPARTMENT AT [facility] Provider Note   CSN: [id number] Arrival date & time: 07/03/23  2000     History  Chief Complaint  Patient presents with   Hip Problem    Abigail Gomez is a 58 y.o. female.  Patient is a 58 year old female with a past medical history of chronic right hip pain status post hip replacement and revision, fibromyalgia, prior lumbar laminectomy, restless leg presenting to the emergency department with right hip pain.  The patient states that she has had trouble managing her pain and has been seen by pain management and orthopedics in the past.  She states that recently she has started to have intermittent episodes of severe pain in the right hip.  She states that the pain occurred on Sunday while she was at [facility] and she had to be helped out to her car.  She states that she took 800 of ibuprofen and was able to make at home but states that the pain is continued.  She states that initially felt like a grinding pain in her hip and is now shooting and throbbing pain down her right leg.  She states that she occasionally gets tingling in her toes.  She denied any falls.  She states that she has chronic urinary incontinence with a neurostimulator in place.  The patient states that she is becoming frustrated that she has been unable to control her pain at home.  She states she is also taking gabapentin , Flexeril , ibuprofen, Tylenol , Celebrex and Norco as needed.  Patient did report suicidal ideation to triage RN.  She states that she feels like she is "nearing a mental health crisis" and last night had thoughts that she would not be upset if she did not wake up this morning.  She states that she is not currently feeling suicidal and has no plan to kill herself. Patient also reports concern she could have a blood clot in her leg.  The history is provided by the patient.       Home Medications Prior to Admission medications   Medication Sig  Start Date End Date Taking? Authorizing Provider  ALPRAZolam (XANAX) 0.5 MG tablet Take 0.5 mg by mouth at bedtime as needed for anxiety.    [provider]  amitriptyline  (ELAVIL ) 10 MG tablet Take 4 tablets (40 mg total) by mouth at bedtime. 10/20/22   Viola Greulich, MD  ARMOUR THYROID  30 MG tablet TAKE 1 TABLET BY MOUTH EVERY DAY 02/19/23   Viola Greulich, MD  cholecalciferol  (VITAMIN D ) 1000 units tablet Take 5,000 Units by mouth daily.     [provider]  cyclobenzaprine  (FLEXERIL ) 10 MG tablet TAKE 1 TABLET BY MOUTH THREE TIMES A DAY AS NEEDED FOR MUSCLE SPASMS 11/02/22   Viola Greulich, MD  diazepam (VALIUM) 10 MG tablet Take 10 mg by mouth at bedtime. Vaginally    [provider]  estradiol  (CLIMARA  - DOSED IN MG/24 HR) 0.1 mg/24hr patch Place 0.1 mg onto the skin 2 (two) times a week.    [provider]  fluconazole  (DIFLUCAN ) 150 MG tablet Take 1 tab now.  Repeat dose in 3 days if needed. 10/11/22   Viola Greulich, MD  HYDROcodone -acetaminophen  (NORCO) 5-325 MG tablet Take 1 tablet by mouth every 6 (six) hours as needed for moderate pain (pain score 4-6). 02/14/23   Viola Greulich, MD  lidocaine  (LIDODERM ) 5 % PLACE 1 PATCH ONTO THE SKIN DAILY. REMOVE & DISCARD  PATCH WITHIN 12 HOURS OR AS DIRECTED BY MD 03/07/20   Viola Greulich, MD  losartan -hydrochlorothiazide  (HYZAAR) 100-25 MG tablet TAKE 1/2 TABLET BY MOUTH DAILY 09/05/22   Viola Greulich, MD  Lurasidone HCl (LATUDA PO) Take 30 mg by mouth daily.    [provider]  Magnesium  Amino Acid Chelate 20 % POWD Take 450 mg by mouth at bedtime.    [provider]  Melatonin 10 MG TABS Take 10 mg by mouth at bedtime.     [provider]  metoprolol  tartrate (LOPRESSOR ) 50 MG tablet TAKE 1 TABLET BY MOUTH TWICE A DAY 02/19/23   Viola Greulich, MD  nitrofurantoin, macrocrystal-monohydrate, (MACROBID) 100 MG capsule Take 100 mg by mouth.    [provider]   pantoprazole  (PROTONIX ) 40 MG tablet Take 1 tablet (40 mg total) by mouth daily. 01/04/23   Viola Greulich, MD  rizatriptan  (MAXALT -MLT) 10 MG disintegrating tablet DISSOLVE 1 TABLET BY MOUTH DAILY AS NEEDED FOR MIGRAINE 12/18/21   Viola Greulich, MD  sulfamethoxazole -trimethoprim  (BACTRIM  DS) 800-160 MG tablet Take 1 tablet by mouth. One tablet before having intercourse.    [provider]  Vibegron (GEMTESA) 75 MG TABS Take 75 mg by mouth daily.    [provider]  vortioxetine  HBr (TRINTELLIX ) 20 MG TABS tablet Take 20 mg by mouth daily.     [provider]  zolpidem (AMBIEN CR) 6.25 MG CR tablet Take 12.5 mg by mouth at bedtime as needed for sleep.    [provider]  zonisamide  (ZONEGRAN ) 50 MG capsule Take 150 mg by mouth at bedtime.    [provider]      Allergies    Peanut oil, Penicillins, Food, Lactose intolerance (gi), Lactulose, Lisinopril, Other, Oxycodone -acetaminophen , Allyl isothiocyanate, and Banana    Review of Systems   Review of Systems  Physical Exam Updated Vital Signs BP (!) 161/97   Pulse 63   Temp 98.6 F (37 C) (Oral)   Resp 17   SpO2 99%  Physical Exam Vitals and nursing note reviewed.  Constitutional:      General: She is not in acute distress.    Appearance: Normal appearance.  HENT:     Head: Normocephalic and atraumatic.     Nose: Nose normal.     Mouth/Throat:     Mouth: Mucous membranes are moist.  Eyes:     Extraocular Movements: Extraocular movements intact.     Conjunctiva/sclera: Conjunctivae normal.  Cardiovascular:     Rate and Rhythm: Normal rate and regular rhythm.     Pulses: Normal pulses.     Heart sounds: Normal heart sounds.  Pulmonary:     Effort: Pulmonary effort is normal.     Breath sounds: Normal breath sounds.  Abdominal:     General: Abdomen is flat.     Palpations: Abdomen is soft.     Tenderness: There is no abdominal tenderness.  Musculoskeletal:        General:  Normal range of motion.     Cervical back: Normal range of motion.     Comments: No point tenderness to RLE, mild pain with hip internal/external rotation  No LE edema bilaterally Negative straight leg raise  Skin:    General: Skin is warm and dry.  Neurological:     General: No focal deficit present.     Mental Status: She is alert and oriented to person, place, and time.     Sensory: No sensory deficit.  Motor: No weakness.  Psychiatric:        Mood and Affect: Mood normal.        Behavior: Behavior normal.     ED Results / Procedures / Treatments   Labs (all labs ordered are listed, but only abnormal results are displayed) Labs Reviewed  CBC WITH DIFFERENTIAL/PLATELET - Abnormal; Notable for the following components:      Result Value   HCT 35.4 (*)    All other components within normal limits  BASIC METABOLIC PANEL WITH GFR  CK  MAGNESIUM     EKG None  Radiology US  Venous Img Lower Right (DVT Study) Result Date: 07/03/2023 CLINICAL DATA:  Hip pain EXAM: Right LOWER EXTREMITY VENOUS DOPPLER ULTRASOUND TECHNIQUE: Gray-scale sonography with compression, as well as color and duplex ultrasound, were performed to evaluate the deep venous system(s) from the level of the common femoral vein through the popliteal and proximal calf veins. COMPARISON:  None Available. FINDINGS: VENOUS Normal compressibility of the common femoral, superficial femoral, and popliteal veins, as well as the visualized calf veins. Visualized portions of profunda femoral vein and great saphenous vein unremarkable. No filling defects to suggest DVT on grayscale or color Doppler imaging. Doppler waveforms show normal direction of venous flow, normal respiratory plasticity and response to augmentation. Limited views of the contralateral common femoral vein are unremarkable. OTHER None. Limitations: none IMPRESSION: Negative. Electronically Signed   By: Esmeralda Hedge M.D.   On: 07/03/2023 23:23   DG Hip Unilat  With Pelvis 2-3 Views Right Result Date: 07/03/2023 CLINICAL DATA:  Hip pain. EXAM: DG HIP (WITH OR WITHOUT PELVIS) 2-3V RIGHT COMPARISON:  10/01/2016 FINDINGS: Right hip arthroplasty in expected alignment. Rounded periprosthetic lucency about the acetabular cup measuring 6 mm may represent particle disease. No other periprosthetic lucency. No pelvic or periprosthetic fracture. Left hip arthroplasty is intact were visualized. The pubic rami are intact. Pre sacral stimulator on the left. IMPRESSION: Right hip arthroplasty in expected alignment, no acute findings. Rounded periprosthetic lucency about the acetabular cup may represent particle disease. Electronically Signed   By: Chadwick Colonel M.D.   On: 07/03/2023 23:01    Procedures Procedures    Medications Ordered in ED Medications  lidocaine  (LIDODERM ) 5 % 1-3 patch (1 patch Transdermal Patch Applied 07/03/23 2207)  ketorolac  (TORADOL ) 15 MG/ML injection 15 mg (15 mg Intravenous Given 07/03/23 2206)  morphine  (PF) 4 MG/ML injection 4 mg (4 mg Intravenous Given 07/03/23 2206)    ED Course/ Medical Decision Making/ A&P Clinical Course as of 07/03/23 2356  Wed Jul 03, 2023  2315 Upon reassessment, patient reports significant improvement of pain. Continues to adamantly deny active SI or plan. States she spoke with her therapist today and has an appt with her psychiatrist tomorrow. [VK]  2356 Patient signed out to Dr. Maralee Senate pending labs with plan for likely discharge with outpatient ortho follow up. [VK]    Clinical Course User Index [VK] Kingsley, Tinesha Siegrist K, DO                                 Medical Decision Making This patient presents to the ED with chief complaint(s) of R hip pain with pertinent past medical history of multiple R hip replacements/revisions, fibromyalgia, prior lumbar laminectomy, chronic urinary incontinence with bladder stimulator in palce which further complicates the presenting complaint. The complaint involves an  extensive differential diagnosis and also carries with it a high risk of  complications and morbidity.    The differential diagnosis includes neuropathy, sciatica, hardware dysfunction, fracture or dislocation, considering DVT though no significant swelling and no VTE risk factors  Additional history obtained: Additional history obtained from N/A Records reviewed Care Everywhere/External Records and Primary Care Documents  ED Course and Reassessment: On patient's arrival she is hemodynamically stable in no acute distress and is neurovascularly intact.  Will of x-ray of her right hip and DVT ultrasound performed.  Will additionally have labs to evaluate for electrolyte derangement or rhabdo as cause of her pain.  Will be given pain control and will be closely reassessed.  Independent labs interpretation:  The following labs were independently interpreted: CBC normal   Independent visualization of imaging: - I independently visualized the following imaging with scope of interpretation limited to determining acute life threatening conditions related to emergency care: DVT US , R hip XR, which revealed no DVT, no hardware malfunction, possible particle disease  Consultation: - Consulted or discussed management/test interpretation w/ external professional: N/A   Amount and/or Complexity of Data Reviewed Labs: ordered. Radiology: ordered.  Risk Prescription drug management.          Final Clinical Impression(s) / ED Diagnoses Final diagnoses:  Right hip pain    Rx / DC Orders ED Discharge Orders     None         Kingsley, Icholas Irby K, DO 07/03/23 2356

## 2023-07-03 NOTE — ED Triage Notes (Addendum)
 Pt reports she has had 4 hip replacement surgeries. Hx of hip dysplasia. Lumbar surgery, chiari malformation, nerve stimulator for incontinence. Pt has full ROM of left hip but not in R hip. She has constant pain. Pt is seeing ortho PA at [facility]. She states she has appt to see doc in May and MRI in April 30 (cannot go to normal MRi due to stimulator). Taking Neurontin , Celebrex, Vicodin... nothing working. She states she is depressed and suicidal. Pain management clinic was doing blocks and injections but not helping.  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 She is visibly upset and frustrated.

## 2023-07-03 NOTE — ED Notes (Signed)
 Patient to xray via stretcher

## 2023-07-04 ENCOUNTER — Ambulatory Visit (INDEPENDENT_AMBULATORY_CARE_PROVIDER_SITE_OTHER): Admitting: Family Medicine

## 2023-07-04 ENCOUNTER — Encounter: Payer: Self-pay | Admitting: Family Medicine

## 2023-07-04 VITALS — BP 140/88 | HR 65

## 2023-07-04 DIAGNOSIS — R9389 Abnormal findings on diagnostic imaging of other specified body structures: Secondary | ICD-10-CM

## 2023-07-04 DIAGNOSIS — M4800 Spinal stenosis, site unspecified: Secondary | ICD-10-CM | POA: Diagnosis not present

## 2023-07-04 DIAGNOSIS — G8929 Other chronic pain: Secondary | ICD-10-CM

## 2023-07-04 DIAGNOSIS — F419 Anxiety disorder, unspecified: Secondary | ICD-10-CM

## 2023-07-04 DIAGNOSIS — F32A Depression, unspecified: Secondary | ICD-10-CM

## 2023-07-04 DIAGNOSIS — K5903 Drug induced constipation: Secondary | ICD-10-CM

## 2023-07-04 DIAGNOSIS — M79604 Pain in right leg: Secondary | ICD-10-CM | POA: Diagnosis not present

## 2023-07-04 LAB — BASIC METABOLIC PANEL WITH GFR
Anion gap: 9 (ref 5–15)
BUN: 14 mg/dL (ref 6–20)
CO2: 28 mmol/L (ref 22–32)
Calcium: 10.3 mg/dL (ref 8.9–10.3)
Chloride: 100 mmol/L (ref 98–111)
Creatinine, Ser: 1.09 mg/dL — ABNORMAL HIGH (ref 0.44–1.00)
GFR, Estimated: 60 mL/min — ABNORMAL LOW (ref >60)
Glucose, Bld: 75 mg/dL (ref 70–99)
Potassium: 3.8 mmol/L (ref 3.5–5.1)
Sodium: 138 mmol/L (ref 135–145)

## 2023-07-04 LAB — CK: Total CK: 125 U/L (ref 38–234)

## 2023-07-04 LAB — MAGNESIUM: Magnesium: 2.2 mg/dL (ref 1.7–2.4)

## 2023-07-04 MED ORDER — SENNOSIDES-DOCUSATE SODIUM 8.6-50 MG PO TABS: 1.0000 | ORAL_TABLET | Freq: Every day | ORAL | 3 refills | Status: AC

## 2023-07-04 NOTE — Progress Notes (Signed)
 Established Patient Office Visit   Subjective  Patient ID: Abigail Gomez, female    DOB: February 26, 1966  Age: 58 y.o. MRN: [id number]  Chief Complaint  Patient presents with   Follow-up    Patient is a 58 year old female with  pmh sig for HTN, migraines, OSA, GERD, hypothyroidism, OA bilateral hips status post bilateral THR with revision of right THR, chronic pain, fibromyalgia, anxiety, depression, urinary incontinence s/p bladder stim placement, HLD who was seen for ED follow-up for chronic concerns.  Patient seen in ED on 07/03/2023 for continued right hip pain.  Patient states pain becoming unbearable.  Endorses passive SI, no plan.In ED pt concern for blood clot in right thigh.  X-ray of right hip and ultrasound of leg performed.  Imaging negative for DVT or hardware malfunction.  Concern for particle disease.  Patient endorses continued pain in right anterior leg with numbness in right anterior shin.  Leg tingles when touched.  Patient previously seen by Dr. Berlinda Breeze who is since retired.  Given gabapentin .  Patient has upcoming appointment with an associate at [facility], Dr. Zollie Hipp.  Seen by pain management, advised against chronic steroid use.  Patient had difficulty getting rollator since last OFV.    Patient Active Problem List   Diagnosis Date Noted   Sjogren's syndrome with inflammatory arthritis (HCC) 01/04/2023   Iliopsoas bursitis of both hips 01/04/2023   Urinary incontinence 01/04/2023   Physical exam 08/31/2016   Hyperlipidemia 02/28/2016   Osteoarthritis of right hip 02/28/2016   Status post total replacement of right hip 02/28/2016   Obstructive sleep apnea syndrome 02/01/2016   Pain of right hip joint 02/01/2016   Hypertension 01/31/2016   Restless leg syndrome 01/31/2016   Sickle cell trait (HCC) 01/31/2016   Gastroesophageal reflux 01/31/2016   History of bunionectomy of both great toes 01/31/2016   History of pelvic fracture 01/31/2016    Trochanteric bursitis, right hip 01/26/2016   Anxiety and depression 09/19/2015   Fibromyalgia 09/19/2015   Migraines 09/19/2015   Hypothyroidism 09/19/2015   Insomnia 08/20/2015   Hypersomnia 08/20/2015   Depression 08/20/2015   Past Medical History:  Diagnosis Date   Allergy    Anemia    Anxiety    Depression    Disease    lymes disease   Fibromyalgia    H/o Lyme disease    H/O sickle cell trait    History of pelvic fracture    History of urinary incontinence    Hypertension    Hypothyroidism    Insomnia    Labral tear of hip joint    Right Hip   Migraine    Osteoarthritis    RLS (restless legs syndrome)    Sleep apnea    cpap   Spinal stenosis    Thyroid  disease    TMJ syndrome    Vitamin D  deficiency    Past Surgical History:  Procedure Laterality Date   ABDOMINAL HYSTERECTOMY     BUNIONECTOMY Bilateral    chiari malformation     ENDOMETRIAL ABLATION     MYOMECTOMY     OOPHORECTOMY     TONSILLECTOMY AND ADENOIDECTOMY     TOTAL HIP ARTHROPLASTY Right 02/28/2016   TOTAL HIP ARTHROPLASTY Right 02/28/2016   Procedure: RIGHT TOTAL HIP ARTHROPLASTY ANTERIOR APPROACH;  Surgeon: Arnie Lao, MD;  Location: [facility] OR;  Service: Orthopedics;  Laterality: Right;   Social History   Tobacco Use   Smoking status: Never   Smokeless tobacco: Never  Vaping Use   Vaping status: Never Used  Substance Use Topics   Alcohol use: Yes    Alcohol/week: 0.0 standard drinks of alcohol    Comment: occ   Drug use: No   Family History  Problem Relation Age of Onset   Hyperlipidemia Mother    Diabetes Father    Hypertension Father    Cancer Father        prostate   Diabetes Sister    Hypertension Sister    Arthritis Maternal Grandmother    Stroke Maternal Grandmother    Hypertension Maternal Grandmother    Hyperlipidemia Maternal Grandmother    Hyperkalemia Maternal Grandmother    Heart disease Maternal Grandmother    Arthritis Paternal Grandmother     Diabetes Sister    Allergies  Allergen Reactions   Peanut Oil Anaphylaxis   Penicillins Other (See Comments) and Anaphylaxis    Syncope Has patient had a PCN reaction causing immediate rash, facial/tongue/throat swelling, SOB or lightheadedness with hypotension:Yes Has patient had a PCN reaction causing severe rash involving mucus membranes or skin necrosis:No Has patient had a PCN reaction that required hospitalization:No Has patient had a PCN reaction occurring within the last 10 years:No If all of the above answers are "NO", then may proceed with Cephalosporin use.    Food     Nuts-itching/swelling Bananas-itching/swelling   Lactose Intolerance (Gi) Other (See Comments)    G.I. upset   Lactulose Other (See Comments)    G.I. upset   Lisinopril Cough   Other Itching    Nuts-itching/swelling Bananas-itching/swelling   Oxycodone -Acetaminophen  Itching and Other (See Comments)   Allyl Isothiocyanate Hives and Rash   Banana Rash      ROS Negative unless stated above    Objective:     BP (!) 140/88 (BP Location: Left Arm, Patient Position: Sitting, Cuff Size: Normal)   Pulse 65   SpO2 97%  BP Readings from Last 3 Encounters:  07/04/23 (!) 140/88  07/03/23 (!) 161/97  06/27/23 (!) 140/90   Wt Readings from Last 3 Encounters:  12/19/22 151 lb 9.6 oz (68.8 kg)  10/11/22 152 lb (68.9 kg)  05/24/22 152 lb 6.4 oz (69.1 kg)   Physical Exam Constitutional:      General: She is not in acute distress.    Appearance: Normal appearance. She is well-developed and well-groomed.     Comments: Appears uncomfortable.  HENT:     Head: Normocephalic and atraumatic.     Nose: Nose normal.     Mouth/Throat:     Mouth: Mucous membranes are moist.  Cardiovascular:     Rate and Rhythm: Normal rate and regular rhythm.     Heart sounds: Normal heart sounds. No murmur heard.    No gallop.  Pulmonary:     Effort: Pulmonary effort is normal. No respiratory distress.     Breath sounds:  Normal breath sounds. No wheezing, rhonchi or rales.  Skin:    General: Skin is warm and dry.  Neurological:     Mental Status: She is alert and oriented to person, place, and time.     Comments: Patient appears uncomfortable sitting in chair.  Ambulating with severe limp using cane.  Psychiatric:        Behavior: Behavior is cooperative.       06/27/2023    5:27 PM 01/04/2023    2:51 PM 12/19/2022   11:46 AM  Depression screen PHQ 2/9  Decreased Interest 1 1   Down, Depressed, Hopeless 3  1 1  PHQ - 2 Score 4 2 1   Altered sleeping 3 1   Tired, decreased energy 2 1 1   Change in appetite 3 3 3   Feeling bad or failure about yourself  2 0 1  Trouble concentrating 1 0 1  Moving slowly or fidgety/restless 1 1 1   Suicidal thoughts 1 0 0  PHQ-9 Score 17 8   Difficult doing work/chores Extremely dIfficult  Very difficult      06/27/2023    5:27 PM 12/19/2022   11:46 AM 10/11/2022    8:41 AM 04/04/2022    2:57 PM  GAD 7 : Generalized Anxiety Score  Nervous, Anxious, on Edge 3 1  1   Control/stop worrying 3 3 1 1   Worry too much - different things 3 3 1 1   Trouble relaxing 3 3 1 1   Restless 3 3 0 0  Easily annoyed or irritable 3 3 1    Afraid - awful might happen 3 3 1    Total GAD 7 Score 21 19    Anxiety Difficulty Extremely difficult Very difficult Very difficult Extremely difficult   Results for orders placed or performed during the hospital encounter of 07/03/23  Basic metabolic panel  Result Value Ref Range   Sodium 138 135 - 145 mmol/L   Potassium 3.8 3.5 - 5.1 mmol/L   Chloride 100 98 - 111 mmol/L   CO2 28 22 - 32 mmol/L   Glucose, Bld 75 70 - 99 mg/dL   BUN 14 6 - 20 mg/dL   Creatinine, Ser 1.61 (H) 0.44 - 1.00 mg/dL   Calcium 09.6 8.9 - 04.5 mg/dL   GFR, Estimated 60 (L) >60 mL/min   Anion gap 9 5 - 15  CBC with Differential  Result Value Ref Range   WBC 6.5 4.0 - 10.5 K/uL   RBC 4.31 3.87 - 5.11 MIL/uL   Hemoglobin 12.7 12.0 - 15.0 g/dL   HCT 40.9 (L) 81.1 - 91.4  %   MCV 82.1 80.0 - 100.0 fL   MCH 29.5 26.0 - 34.0 pg   MCHC 35.9 30.0 - 36.0 g/dL   RDW 78.2 95.6 - 21.3 %   Platelets 165 150 - 400 K/uL   nRBC 0.0 0.0 - 0.2 %   Neutrophils Relative % 33 %   Neutro Abs 2.2 1.7 - 7.7 K/uL   Lymphocytes Relative 55 %   Lymphs Abs 3.5 0.7 - 4.0 K/uL   Monocytes Relative 11 %   Monocytes Absolute 0.7 0.1 - 1.0 K/uL   Eosinophils Relative 1 %   Eosinophils Absolute 0.0 0.0 - 0.5 K/uL   Basophils Relative 0 %   Basophils Absolute 0.0 0.0 - 0.1 K/uL   Immature Granulocytes 0 %   Abs Immature Granulocytes 0.01 0.00 - 0.07 K/uL  CK  Result Value Ref Range   Total CK 125 38 - 234 U/L  Magnesium   Result Value Ref Range   Magnesium  2.2 1.7 - 2.4 mg/dL      Assessment & Plan:  Chronic pain of right lower extremity  Abnormal ultrasound  Anxiety and depression  Spinal stenosis, unspecified spinal region  Drug-induced constipation -     Sennosides-Docusate Sodium ; Take 1 tablet by mouth daily.  Dispense: 30 tablet; Refill: 3  Concern for particle disease contributing to patient's chronic right hip/thigh pain as no hardware malfunction noted and no DVT present on recent imaging from ED visit 07/03/2023.  DG hip unilateral right hip arthroplasty in expected alignment, no acute  findings.  Rounded periprosthetic lucency, 6 mm about the acetabular cup may represent particle disease.  Patient advised to keep follow-up appointment with Ortho and consider second opinion.  Will see if any providers have experience particle disease.  Patient encouraged to follow-up with [facility] provider regarding adjustment of anxiety/depression medications.  PHQ-9 score 17, GAD-7 score 21 this visit.  Continue follow-up with pain management.  Consider starting sustained-release opioid medication for breakthrough pain.  Senna-Colace for constipation.  No follow-ups on file.   Viola Greulich, MD

## 2023-07-04 NOTE — Discharge Instructions (Signed)
 Follow-up with your orthopedic surgeon.

## 2023-07-10 DIAGNOSIS — Z96641 Presence of right artificial hip joint: Secondary | ICD-10-CM | POA: Diagnosis not present

## 2023-07-10 DIAGNOSIS — X58XXXA Exposure to other specified factors, initial encounter: Secondary | ICD-10-CM | POA: Diagnosis not present

## 2023-07-10 DIAGNOSIS — M67853 Other specified disorders of tendon, right hip: Secondary | ICD-10-CM | POA: Diagnosis not present

## 2023-07-10 DIAGNOSIS — S76011A Strain of muscle, fascia and tendon of right hip, initial encounter: Secondary | ICD-10-CM | POA: Diagnosis not present

## 2023-07-10 DIAGNOSIS — Z471 Aftercare following joint replacement surgery: Secondary | ICD-10-CM | POA: Diagnosis not present

## 2023-07-10 DIAGNOSIS — Z96649 Presence of unspecified artificial hip joint: Secondary | ICD-10-CM | POA: Diagnosis not present

## 2023-07-11 DIAGNOSIS — M79604 Pain in right leg: Secondary | ICD-10-CM | POA: Diagnosis not present

## 2023-07-11 DIAGNOSIS — M25551 Pain in right hip: Secondary | ICD-10-CM | POA: Diagnosis not present

## 2023-07-11 DIAGNOSIS — R202 Paresthesia of skin: Secondary | ICD-10-CM | POA: Diagnosis not present

## 2023-07-11 DIAGNOSIS — F331 Major depressive disorder, recurrent, moderate: Secondary | ICD-10-CM | POA: Diagnosis not present

## 2023-07-11 DIAGNOSIS — M6281 Muscle weakness (generalized): Secondary | ICD-10-CM | POA: Diagnosis not present

## 2023-07-12 ENCOUNTER — Telehealth: Payer: Self-pay

## 2023-07-12 ENCOUNTER — Emergency Department (HOSPITAL_BASED_OUTPATIENT_CLINIC_OR_DEPARTMENT_OTHER)
Admission: EM | Admit: 2023-07-12 | Discharge: 2023-07-13 | Disposition: A | Discharge status: Home / Self Care | Attending: Emergency Medicine | Admitting: Emergency Medicine

## 2023-07-12 ENCOUNTER — Encounter (HOSPITAL_BASED_OUTPATIENT_CLINIC_OR_DEPARTMENT_OTHER): Payer: Self-pay

## 2023-07-12 DIAGNOSIS — M25551 Pain in right hip: Secondary | ICD-10-CM | POA: Insufficient documentation

## 2023-07-12 DIAGNOSIS — E876 Hypokalemia: Secondary | ICD-10-CM | POA: Diagnosis not present

## 2023-07-12 DIAGNOSIS — G8929 Other chronic pain: Secondary | ICD-10-CM | POA: Diagnosis not present

## 2023-07-12 DIAGNOSIS — Z91010 Allergy to peanuts: Secondary | ICD-10-CM | POA: Diagnosis not present

## 2023-07-12 DIAGNOSIS — Z79899 Other long term (current) drug therapy: Secondary | ICD-10-CM | POA: Insufficient documentation

## 2023-07-12 NOTE — ED Triage Notes (Signed)
 Here for right hip pain.  States seen for same last week.  States not able to see Ortho doctor until May 7  Has no pain medications and states pain uncontrolled.  To triage in wheel chair

## 2023-07-12 NOTE — Telephone Encounter (Signed)
 Copied from CRM [id number]. Topic: General - Other >> Jul 12, 2023  4:12 PM Howard Macho wrote: Reason for CRM: jennifer from [facility] called stating the patient this morning was suicidal, spiraling and threatening legal action. Bridgette Campus stated that law enforcement was called to her home to help and jennifer was trying to get in contact with the patient. Bridgette Campus stated she wanted to make the doctor aware and to follow-up with the patient CB [phone]

## 2023-07-12 NOTE — ED Provider Notes (Signed)
 [facility] AT [facility] Provider Note   CSN: [id number] Arrival date & time: 07/12/23  2021     History {Add pertinent medical, surgical, social history, OB history to HPI:1} Chief Complaint  Patient presents with   Hip Pain    Abigail Gomez is a 58 y.o. female.   Hip Pain       Home Medications Prior to Admission medications   Medication Sig Start Date End Date Taking? Authorizing Provider  ALPRAZolam (XANAX) 0.5 MG tablet Take 0.5 mg by mouth at bedtime as needed for anxiety.    [provider]  amitriptyline  (ELAVIL ) 10 MG tablet Take 4 tablets (40 mg total) by mouth at bedtime. 10/20/22   Viola Greulich, MD  ARMOUR THYROID  30 MG tablet TAKE 1 TABLET BY MOUTH EVERY DAY 02/19/23   Viola Greulich, MD  cholecalciferol  (VITAMIN D ) 1000 units tablet Take 5,000 Units by mouth daily.     [provider]  cyclobenzaprine  (FLEXERIL ) 10 MG tablet TAKE 1 TABLET BY MOUTH THREE TIMES A DAY AS NEEDED FOR MUSCLE SPASMS 11/02/22   Viola Greulich, MD  diazepam (VALIUM) 10 MG tablet Take 10 mg by mouth at bedtime. Vaginally    [provider]  estradiol  (CLIMARA  - DOSED IN MG/24 HR) 0.1 mg/24hr patch Place 0.1 mg onto the skin 2 (two) times a week.    [provider]  fluconazole  (DIFLUCAN ) 150 MG tablet Take 1 tab now.  Repeat dose in 3 days if needed. 10/11/22   Viola Greulich, MD  HYDROcodone -acetaminophen  (NORCO) 5-325 MG tablet Take 1 tablet by mouth every 6 (six) hours as needed for moderate pain (pain score 4-6). 02/14/23   Viola Greulich, MD  lidocaine  (LIDODERM ) 5 % PLACE 1 PATCH ONTO THE SKIN DAILY. REMOVE & DISCARD PATCH WITHIN 12 HOURS OR AS DIRECTED BY MD 03/07/20   Viola Greulich, MD  losartan -hydrochlorothiazide  (HYZAAR) 100-25 MG tablet TAKE 1/2 TABLET BY MOUTH DAILY 09/05/22   Viola Greulich, MD  Lurasidone HCl (LATUDA PO) Take 30 mg by mouth daily.    [provider]  Magnesium  Amino Acid  Chelate 20 % POWD Take 450 mg by mouth at bedtime.    [provider]  Melatonin 10 MG TABS Take 10 mg by mouth at bedtime.     [provider]  metoprolol  tartrate (LOPRESSOR ) 50 MG tablet TAKE 1 TABLET BY MOUTH TWICE A DAY 02/19/23   Viola Greulich, MD  nitrofurantoin, macrocrystal-monohydrate, (MACROBID) 100 MG capsule Take 100 mg by mouth.    [provider]  pantoprazole  (PROTONIX ) 40 MG tablet Take 1 tablet (40 mg total) by mouth daily. 01/04/23   Viola Greulich, MD  rizatriptan  (MAXALT -MLT) 10 MG disintegrating tablet DISSOLVE 1 TABLET BY MOUTH DAILY AS NEEDED FOR MIGRAINE 12/18/21   Viola Greulich, MD  senna-docusate (SENOKOT-S) 8.6-50 MG tablet Take 1 tablet by mouth daily. 07/04/23   Viola Greulich, MD  sulfamethoxazole -trimethoprim  (BACTRIM  DS) 800-160 MG tablet Take 1 tablet by mouth. One tablet before having intercourse.    [provider]  Vibegron (GEMTESA) 75 MG TABS Take 75 mg by mouth daily.    [provider]  vortioxetine  HBr (TRINTELLIX ) 20 MG TABS tablet Take 20 mg by mouth daily.     [provider]  zolpidem (AMBIEN CR) 6.25 MG CR tablet Take 12.5 mg by mouth at bedtime as needed for sleep.    [provider]  zonisamide  (ZONEGRAN ) 50 MG capsule Take 150 mg by mouth at bedtime.    [provider]      Allergies    Peanut oil, Penicillins, Food, Lactose intolerance (gi), Lactulose, Lisinopril, Other, Oxycodone -acetaminophen , Allyl isothiocyanate, and Banana    Review of Systems   Review of Systems  Physical Exam Updated Vital Signs BP 132/84 (BP Location: Right Arm)   Pulse 79   Temp 98.5 F (36.9 C)   Resp 20   Ht 5\' 7"  (1.702 m)   Wt 63.5 kg   SpO2 100%   BMI 21.93 kg/m  Physical Exam  ED Results / Procedures / Treatments   Labs (all labs ordered are listed, but only abnormal results are displayed) Labs Reviewed - No data to display  EKG None  Radiology No results  found.  Procedures Procedures  {Document cardiac monitor, telemetry assessment procedure when appropriate:1}  Medications Ordered in ED Medications - No data to display  ED Course/ Medical Decision Making/ A&P   {   Click here for ABCD2, HEART and other calculatorsREFRESH Note before signing :1}                              Medical Decision Making  ***  {Document critical care time when appropriate:1} {Document review of labs and clinical decision tools ie heart score, Chads2Vasc2 etc:1}  {Document your independent review of radiology images, and any outside records:1} {Document your discussion with family members, caretakers, and with consultants:1} {Document social determinants of health affecting pt's care:1} {Document your decision making why or why not admission, treatments were needed:1} Final Clinical Impression(s) / ED Diagnoses Final diagnoses:  None    Rx / DC Orders ED Discharge Orders     None

## 2023-07-13 LAB — CBC WITH DIFFERENTIAL/PLATELET
Abs Immature Granulocytes: 0 10*3/uL (ref 0.00–0.07)
Basophils Absolute: 0 10*3/uL (ref 0.0–0.1)
Basophils Relative: 0 %
Eosinophils Absolute: 0.1 10*3/uL (ref 0.0–0.5)
Eosinophils Relative: 1 %
HCT: 32.2 % — ABNORMAL LOW (ref 36.0–46.0)
Hemoglobin: 11.9 g/dL — ABNORMAL LOW (ref 12.0–15.0)
Immature Granulocytes: 0 %
Lymphocytes Relative: 72 %
Lymphs Abs: 4.6 10*3/uL — ABNORMAL HIGH (ref 0.7–4.0)
MCH: 29.9 pg (ref 26.0–34.0)
MCHC: 37 g/dL — ABNORMAL HIGH (ref 30.0–36.0)
MCV: 80.9 fL (ref 80.0–100.0)
Monocytes Absolute: 0.5 10*3/uL (ref 0.1–1.0)
Monocytes Relative: 8 %
Neutro Abs: 1.2 10*3/uL — ABNORMAL LOW (ref 1.7–7.7)
Neutrophils Relative %: 19 %
Platelets: 152 10*3/uL (ref 150–400)
RBC: 3.98 MIL/uL (ref 3.87–5.11)
RDW: 14 % (ref 11.5–15.5)
WBC: 6.4 10*3/uL (ref 4.0–10.5)
nRBC: 0 % (ref 0.0–0.2)

## 2023-07-13 LAB — COMPREHENSIVE METABOLIC PANEL WITH GFR
ALT: 17 U/L (ref 0–44)
AST: 24 U/L (ref 15–41)
Albumin: 4.3 g/dL (ref 3.5–5.0)
Alkaline Phosphatase: 88 U/L (ref 38–126)
Anion gap: 11 (ref 5–15)
BUN: 13 mg/dL (ref 6–20)
CO2: 26 mmol/L (ref 22–32)
Calcium: 9.9 mg/dL (ref 8.9–10.3)
Chloride: 101 mmol/L (ref 98–111)
Creatinine, Ser: 1.05 mg/dL — ABNORMAL HIGH (ref 0.44–1.00)
GFR, Estimated: >60 mL/min (ref >60)
Glucose, Bld: 83 mg/dL (ref 70–99)
Potassium: 3.4 mmol/L — ABNORMAL LOW (ref 3.5–5.1)
Sodium: 138 mmol/L (ref 135–145)
Total Bilirubin: 0.3 mg/dL (ref 0.0–1.2)
Total Protein: 6.4 g/dL — ABNORMAL LOW (ref 6.5–8.1)

## 2023-07-13 LAB — CK: Total CK: 94 U/L (ref 38–234)

## 2023-07-13 LAB — MAGNESIUM: Magnesium: 2 mg/dL (ref 1.7–2.4)

## 2023-07-13 MED ORDER — POTASSIUM CHLORIDE CRYS ER 20 MEQ PO TBCR
20.0000 meq | EXTENDED_RELEASE_TABLET | Freq: Once | ORAL | Status: AC
  Administered 2023-07-13: 20 meq via ORAL
  Filled 2023-07-13: qty 1

## 2023-07-13 MED ORDER — LIDOCAINE 5 % EX PTCH
1.0000 | MEDICATED_PATCH | CUTANEOUS | Status: DC
  Administered 2023-07-13: 1 via TRANSDERMAL
  Filled 2023-07-13: qty 1

## 2023-07-13 MED ORDER — KETOROLAC TROMETHAMINE 10 MG PO TABS: 10.0000 mg | ORAL_TABLET | Freq: Four times a day (QID) | ORAL | 0 refills | Status: DC | PRN

## 2023-07-13 MED ORDER — ONDANSETRON HCL 4 MG/2ML IJ SOLN
4.0000 mg | Freq: Once | INTRAMUSCULAR | Status: AC
  Administered 2023-07-13: 4 mg via INTRAVENOUS
  Filled 2023-07-13: qty 2

## 2023-07-13 MED ORDER — OXYCODONE-ACETAMINOPHEN 5-325 MG PO TABS: 1.0000 | ORAL_TABLET | Freq: Four times a day (QID) | ORAL | 0 refills | Status: DC | PRN

## 2023-07-13 MED ORDER — ONDANSETRON 4 MG PO TBDP
4.0000 mg | ORAL_TABLET | Freq: Three times a day (TID) | ORAL | 0 refills | Status: DC | PRN
Start: 2023-07-13 — End: 2024-01-03

## 2023-07-13 MED ORDER — KETOROLAC TROMETHAMINE 15 MG/ML IJ SOLN
15.0000 mg | Freq: Once | INTRAMUSCULAR | Status: AC
  Administered 2023-07-13: 15 mg via INTRAVENOUS
  Filled 2023-07-13: qty 1

## 2023-07-13 MED ORDER — HYDROMORPHONE HCL 1 MG/ML IJ SOLN
1.0000 mg | Freq: Once | INTRAMUSCULAR | Status: AC
  Administered 2023-07-13: 1 mg via INTRAVENOUS
  Filled 2023-07-13: qty 1

## 2023-07-13 NOTE — Discharge Instructions (Addendum)
 You presented for need for acute pain management in the setting of your chronic right hip pain.  With no recent falls or trauma, recent MRI imaging done outpatient, no indication for acute imaging evaluation at this time.  Following adequate pain control, you are feeling symptomatically improved.  You would benefit greatly from an outpatient pain specialist for further long-term management.  Please follow-up with orthopedics as well as a pain specialist of your choice.  A short course of opiates have been prescribed for breakthrough pain, oral Toradol  has also been prescribed.

## 2023-07-17 DIAGNOSIS — M7071 Other bursitis of hip, right hip: Secondary | ICD-10-CM | POA: Diagnosis not present

## 2023-07-17 DIAGNOSIS — Z96649 Presence of unspecified artificial hip joint: Secondary | ICD-10-CM | POA: Diagnosis not present

## 2023-07-18 DIAGNOSIS — M79604 Pain in right leg: Secondary | ICD-10-CM | POA: Diagnosis not present

## 2023-07-18 DIAGNOSIS — M25551 Pain in right hip: Secondary | ICD-10-CM | POA: Diagnosis not present

## 2023-07-18 DIAGNOSIS — M6281 Muscle weakness (generalized): Secondary | ICD-10-CM | POA: Diagnosis not present

## 2023-07-18 DIAGNOSIS — R202 Paresthesia of skin: Secondary | ICD-10-CM | POA: Diagnosis not present

## 2023-07-22 ENCOUNTER — Other Ambulatory Visit: Payer: Self-pay | Admitting: Orthopedic Surgery

## 2023-07-22 DIAGNOSIS — Z96649 Presence of unspecified artificial hip joint: Secondary | ICD-10-CM

## 2023-07-23 ENCOUNTER — Telehealth: Payer: Self-pay

## 2023-07-23 ENCOUNTER — Encounter: Payer: Self-pay | Admitting: Orthopedic Surgery

## 2023-07-23 DIAGNOSIS — Z96641 Presence of right artificial hip joint: Secondary | ICD-10-CM

## 2023-07-23 DIAGNOSIS — Z96649 Presence of unspecified artificial hip joint: Secondary | ICD-10-CM | POA: Diagnosis not present

## 2023-07-23 NOTE — Telephone Encounter (Signed)
 Called patient left a VM Dr Vinton Greig is out of the office today.

## 2023-07-24 NOTE — Addendum Note (Signed)
 Addended by: Georga Killings A on: 07/24/2023 10:26 AM   Modules accepted: Orders

## 2023-07-24 NOTE — Telephone Encounter (Signed)
 Spoke with Dr. Arliss Lam we are able to put labs in for the patient, called patient unable to leave VM, will call back at a later time

## 2023-07-25 ENCOUNTER — Ambulatory Visit
Admission: RE | Admit: 2023-07-25 | Discharge: 2023-07-25 | Disposition: A | Discharge status: Home / Self Care | Source: Ambulatory Visit | Attending: Orthopedic Surgery | Admitting: Orthopedic Surgery

## 2023-07-25 DIAGNOSIS — R296 Repeated falls: Secondary | ICD-10-CM | POA: Diagnosis not present

## 2023-07-25 DIAGNOSIS — Z96649 Presence of unspecified artificial hip joint: Secondary | ICD-10-CM

## 2023-07-25 DIAGNOSIS — M25551 Pain in right hip: Secondary | ICD-10-CM | POA: Diagnosis not present

## 2023-07-29 DIAGNOSIS — M79604 Pain in right leg: Secondary | ICD-10-CM | POA: Diagnosis not present

## 2023-07-29 DIAGNOSIS — M6281 Muscle weakness (generalized): Secondary | ICD-10-CM | POA: Diagnosis not present

## 2023-07-29 DIAGNOSIS — R202 Paresthesia of skin: Secondary | ICD-10-CM | POA: Diagnosis not present

## 2023-07-29 DIAGNOSIS — M25551 Pain in right hip: Secondary | ICD-10-CM | POA: Diagnosis not present

## 2023-07-30 DIAGNOSIS — Y792 Prosthetic and other implants, materials and accessory orthopedic devices associated with adverse incidents: Secondary | ICD-10-CM | POA: Diagnosis not present

## 2023-07-30 DIAGNOSIS — T8484XA Pain due to internal orthopedic prosthetic devices, implants and grafts, initial encounter: Secondary | ICD-10-CM | POA: Diagnosis not present

## 2023-07-30 DIAGNOSIS — M25551 Pain in right hip: Secondary | ICD-10-CM | POA: Diagnosis not present

## 2023-07-30 DIAGNOSIS — Z96641 Presence of right artificial hip joint: Secondary | ICD-10-CM | POA: Diagnosis not present

## 2023-07-31 DIAGNOSIS — M25551 Pain in right hip: Secondary | ICD-10-CM | POA: Diagnosis not present

## 2023-07-31 DIAGNOSIS — R202 Paresthesia of skin: Secondary | ICD-10-CM | POA: Diagnosis not present

## 2023-07-31 DIAGNOSIS — F4542 Pain disorder with related psychological factors: Secondary | ICD-10-CM | POA: Diagnosis not present

## 2023-07-31 DIAGNOSIS — F607 Dependent personality disorder: Secondary | ICD-10-CM | POA: Diagnosis not present

## 2023-07-31 DIAGNOSIS — M79604 Pain in right leg: Secondary | ICD-10-CM | POA: Diagnosis not present

## 2023-07-31 DIAGNOSIS — M6281 Muscle weakness (generalized): Secondary | ICD-10-CM | POA: Diagnosis not present

## 2023-07-31 DIAGNOSIS — F4323 Adjustment disorder with mixed anxiety and depressed mood: Secondary | ICD-10-CM | POA: Diagnosis not present

## 2023-08-01 DIAGNOSIS — R202 Paresthesia of skin: Secondary | ICD-10-CM | POA: Diagnosis not present

## 2023-08-01 DIAGNOSIS — M25551 Pain in right hip: Secondary | ICD-10-CM | POA: Diagnosis not present

## 2023-08-01 DIAGNOSIS — M79604 Pain in right leg: Secondary | ICD-10-CM | POA: Diagnosis not present

## 2023-08-01 DIAGNOSIS — M6281 Muscle weakness (generalized): Secondary | ICD-10-CM | POA: Diagnosis not present

## 2023-08-02 DIAGNOSIS — M25551 Pain in right hip: Secondary | ICD-10-CM | POA: Diagnosis not present

## 2023-08-02 DIAGNOSIS — M6281 Muscle weakness (generalized): Secondary | ICD-10-CM | POA: Diagnosis not present

## 2023-08-02 DIAGNOSIS — M79604 Pain in right leg: Secondary | ICD-10-CM | POA: Diagnosis not present

## 2023-08-02 DIAGNOSIS — R202 Paresthesia of skin: Secondary | ICD-10-CM | POA: Diagnosis not present

## 2023-08-07 DIAGNOSIS — R202 Paresthesia of skin: Secondary | ICD-10-CM | POA: Diagnosis not present

## 2023-08-07 DIAGNOSIS — M79604 Pain in right leg: Secondary | ICD-10-CM | POA: Diagnosis not present

## 2023-08-07 DIAGNOSIS — M6281 Muscle weakness (generalized): Secondary | ICD-10-CM | POA: Diagnosis not present

## 2023-08-07 DIAGNOSIS — M25551 Pain in right hip: Secondary | ICD-10-CM | POA: Diagnosis not present

## 2023-08-08 DIAGNOSIS — G8929 Other chronic pain: Secondary | ICD-10-CM | POA: Diagnosis not present

## 2023-08-08 DIAGNOSIS — M25551 Pain in right hip: Secondary | ICD-10-CM | POA: Diagnosis not present

## 2023-08-08 DIAGNOSIS — Z96641 Presence of right artificial hip joint: Secondary | ICD-10-CM | POA: Diagnosis not present

## 2023-08-08 DIAGNOSIS — M5416 Radiculopathy, lumbar region: Secondary | ICD-10-CM | POA: Diagnosis not present

## 2023-08-08 DIAGNOSIS — M1611 Unilateral primary osteoarthritis, right hip: Secondary | ICD-10-CM | POA: Diagnosis not present

## 2023-08-09 ENCOUNTER — Encounter: Payer: Self-pay | Admitting: Family Medicine

## 2023-08-09 ENCOUNTER — Telehealth (INDEPENDENT_AMBULATORY_CARE_PROVIDER_SITE_OTHER): Admitting: Family Medicine

## 2023-08-09 DIAGNOSIS — M79604 Pain in right leg: Secondary | ICD-10-CM

## 2023-08-09 DIAGNOSIS — M6281 Muscle weakness (generalized): Secondary | ICD-10-CM | POA: Diagnosis not present

## 2023-08-09 DIAGNOSIS — R29898 Other symptoms and signs involving the musculoskeletal system: Secondary | ICD-10-CM

## 2023-08-09 DIAGNOSIS — M25551 Pain in right hip: Secondary | ICD-10-CM | POA: Diagnosis not present

## 2023-08-09 DIAGNOSIS — Z96641 Presence of right artificial hip joint: Secondary | ICD-10-CM | POA: Diagnosis not present

## 2023-08-09 DIAGNOSIS — M797 Fibromyalgia: Secondary | ICD-10-CM | POA: Diagnosis not present

## 2023-08-09 DIAGNOSIS — G8929 Other chronic pain: Secondary | ICD-10-CM

## 2023-08-09 DIAGNOSIS — M4800 Spinal stenosis, site unspecified: Secondary | ICD-10-CM | POA: Diagnosis not present

## 2023-08-09 DIAGNOSIS — F419 Anxiety disorder, unspecified: Secondary | ICD-10-CM

## 2023-08-09 DIAGNOSIS — R202 Paresthesia of skin: Secondary | ICD-10-CM | POA: Diagnosis not present

## 2023-08-09 DIAGNOSIS — Z1379 Encounter for other screening for genetic and chromosomal anomalies: Secondary | ICD-10-CM

## 2023-08-09 DIAGNOSIS — R32 Unspecified urinary incontinence: Secondary | ICD-10-CM

## 2023-08-09 DIAGNOSIS — F32A Depression, unspecified: Secondary | ICD-10-CM

## 2023-08-09 NOTE — Progress Notes (Signed)
 Virtual Visit via Video Note  I connected with Abigail Gomez on 08/09/23 at 10:00 AM EDT by a video enabled telemedicine application and verified that I am speaking with the correct person using two identifiers.  Location patient: home Location provider:work or home office Persons participating in the virtual visit: patient, provider  I discussed the limitations of evaluation and management by telemedicine and the availability of in person appointments. The patient expressed understanding and agreed to proceed.   HPI:  Pt is a 58 yo female with chronic pain, fibromyalgia who presents with severe pain and inadequate pain management.  She experiences severe, unbearable pain that has led to two recent emergency room visits. She has not slept in three days due to the intensity of the pain. Her current pain management plan is ineffective, and she has been unable to find a provider who accepts her insurance for acupuncture, which she has used in the past. She is not working and cannot afford the out-of-pocket costs for acupuncture.  She describes the pain as intense and has suicidal ideations due to the severity of her condition. 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 She reports soreness and tenderness in her right hip and leg following these falls.  She has undergone multiple diagnostic tests, including a CT scan, ultrasound, and MRI, but has faced difficulties in obtaining the results for her current provider to review. She expresses frustration with the lack of timely follow-up and the inability to access her medical records. She has been prescribed Toradol , Percocet, and morphine  during her ER visits, which have provided some relief, but she is concerned about the need for frequent ER visits to manage her pain.  She has a history of fibromyalgia, tendonitis, bursitis, and hip dysplasia, which she believes are contributing to her  current pain. She has had multiple hip surgeries. A specialist suggested her current pain may be related to her back rather than her hip. She reports pain radiating down the front of her leg into her feet, which she believes may be related to nerve issues.  She has been prescribed various medications, including gabapentin  (600 mg four times a day), Celebrex, and a non-opioid medication called Jornay for neuropathic pain. Gabapentin  and Garen Juneau are not effective, with [place] providing relief for only a short period. She has previously tried Lyrica but experienced adverse effects, including dizziness, "feeling loopy"   She manages her mental health with Latuda and Trintellix , and has recently increased her Latuda dosage to help stabilize her mood. She is frustrated with the delays in receiving her medications and the lack of timely follow-up from her mental health provider.  Seen by Dr. Rexanne Catalina, Ortho.    Pt had an appt at [facility] with Dr. Flavia Hughs who does not think her pain is from her R hip, but rather her low back.  ROS: See pertinent positives and negatives per HPI.  Past Medical History:  Diagnosis Date   Allergy    Anemia    Anxiety    Depression    Disease    lymes disease   Fibromyalgia    H/o Lyme disease    H/O sickle cell trait    History of pelvic fracture    History of urinary incontinence    Hypertension    Hypothyroidism    Insomnia    Labral tear of hip joint    Right Hip   Migraine    Osteoarthritis  RLS (restless legs syndrome)    Sleep apnea    cpap   Spinal stenosis    Thyroid  disease    TMJ syndrome    Vitamin D  deficiency     Past Surgical History:  Procedure Laterality Date   ABDOMINAL HYSTERECTOMY     BUNIONECTOMY Bilateral    chiari malformation     ENDOMETRIAL ABLATION     MYOMECTOMY     OOPHORECTOMY     TONSILLECTOMY AND ADENOIDECTOMY     TOTAL HIP ARTHROPLASTY Right 02/28/2016   TOTAL HIP ARTHROPLASTY Right 02/28/2016   Procedure: RIGHT  TOTAL HIP ARTHROPLASTY ANTERIOR APPROACH;  Surgeon: Arnie Lao, MD;  Location: [facility] OR;  Service: Orthopedics;  Laterality: Right;    Family History  Problem Relation Age of Onset   Hyperlipidemia Mother    Diabetes Father    Hypertension Father    Cancer Father        prostate   Diabetes Sister    Hypertension Sister    Arthritis Maternal Grandmother    Stroke Maternal Grandmother    Hypertension Maternal Grandmother    Hyperlipidemia Maternal Grandmother    Hyperkalemia Maternal Grandmother    Heart disease Maternal Grandmother    Arthritis Paternal Grandmother    Diabetes Sister     Current Outpatient Medications:    ALPRAZolam (XANAX) 0.5 MG tablet, Take 0.5 mg by mouth at bedtime as needed for anxiety., Disp: , Rfl:    amitriptyline  (ELAVIL ) 10 MG tablet, Take 4 tablets (40 mg total) by mouth at bedtime., Disp: 360 tablet, Rfl: 3   ARMOUR THYROID  30 MG tablet, TAKE 1 TABLET BY MOUTH EVERY DAY, Disp: 90 tablet, Rfl: 1   cholecalciferol  (VITAMIN D ) 1000 units tablet, Take 5,000 Units by mouth daily. , Disp: , Rfl:    cyclobenzaprine  (FLEXERIL ) 10 MG tablet, TAKE 1 TABLET BY MOUTH THREE TIMES A DAY AS NEEDED FOR MUSCLE SPASMS, Disp: 90 tablet, Rfl: 5   estradiol  (CLIMARA  - DOSED IN MG/24 HR) 0.1 mg/24hr patch, Place 0.1 mg onto the skin 2 (two) times a week., Disp: , Rfl:    fluconazole  (DIFLUCAN ) 150 MG tablet, Take 1 tab now.  Repeat dose in 3 days if needed., Disp: 3 tablet, Rfl: 0   ketorolac  (TORADOL ) 10 MG tablet, Take 1 tablet (10 mg total) by mouth every 6 (six) hours as needed., Disp: 20 tablet, Rfl: 0   lidocaine  (LIDODERM ) 5 %, PLACE 1 PATCH ONTO THE SKIN DAILY. REMOVE & DISCARD PATCH WITHIN 12 HOURS OR AS DIRECTED BY MD, Disp: 90 patch, Rfl: 3   losartan -hydrochlorothiazide  (HYZAAR) 100-25 MG tablet, TAKE 1/2 TABLET BY MOUTH DAILY, Disp: 45 tablet, Rfl: 1   Lurasidone HCl (LATUDA PO), Take 30 mg by mouth daily., Disp: , Rfl:    Magnesium  Amino Acid Chelate 20  % POWD, Take 450 mg by mouth at bedtime., Disp: , Rfl:    Melatonin 10 MG TABS, Take 10 mg by mouth at bedtime. , Disp: , Rfl:    metoprolol  tartrate (LOPRESSOR ) 50 MG tablet, TAKE 1 TABLET BY MOUTH TWICE A DAY, Disp: 180 tablet, Rfl: 3   nitrofurantoin, macrocrystal-monohydrate, (MACROBID) 100 MG capsule, Take 100 mg by mouth., Disp: , Rfl:    ondansetron  (ZOFRAN -ODT) 4 MG disintegrating tablet, Take 1 tablet (4 mg total) by mouth every 8 (eight) hours as needed., Disp: 20 tablet, Rfl: 0   oxyCODONE -acetaminophen  (PERCOCET/ROXICET) 5-325 MG tablet, Take 1-2 tablets by mouth every 6 (six) hours as needed for severe  pain (pain score 7-10)., Disp: 20 tablet, Rfl: 0   pantoprazole  (PROTONIX ) 40 MG tablet, Take 1 tablet (40 mg total) by mouth daily., Disp: 90 tablet, Rfl: 1   rizatriptan  (MAXALT -MLT) 10 MG disintegrating tablet, DISSOLVE 1 TABLET BY MOUTH DAILY AS NEEDED FOR MIGRAINE, Disp: 10 tablet, Rfl: 4   senna-docusate (SENOKOT-S) 8.6-50 MG tablet, Take 1 tablet by mouth daily., Disp: 30 tablet, Rfl: 3   sulfamethoxazole -trimethoprim  (BACTRIM  DS) 800-160 MG tablet, Take 1 tablet by mouth. One tablet before having intercourse., Disp: , Rfl:    Vibegron (GEMTESA) 75 MG TABS, Take 75 mg by mouth daily., Disp: , Rfl:    vortioxetine  HBr (TRINTELLIX ) 20 MG TABS tablet, Take 20 mg by mouth daily. , Disp: , Rfl:    zolpidem (AMBIEN CR) 6.25 MG CR tablet, Take 12.5 mg by mouth at bedtime as needed for sleep., Disp: , Rfl:    diazepam (VALIUM) 10 MG tablet, Take 10 mg by mouth at bedtime. Vaginally (Patient not taking: Reported on 08/09/2023), Disp: , Rfl:    zonisamide  (ZONEGRAN ) 50 MG capsule, Take 150 mg by mouth at bedtime., Disp: , Rfl:   EXAM:  VITALS per patient if applicable:  RR between 12-20 bpm  GENERAL: alert, oriented, appears well and in no acute distress  HEENT: atraumatic, conjunctiva clear, no obvious abnormalities on inspection of external nose and ears  NECK: normal movements of  the head and neck  LUNGS: on inspection no signs of respiratory distress, breathing rate appears normal, no obvious gross SOB, gasping or wheezing  CV: no obvious cyanosis  MS: moves all visible extremities without noticeable abnormality  PSYCH/NEURO: pleasant and cooperative, no obvious depression or anxiety, speech and thought processing grossly intact  ASSESSMENT AND PLAN:  Discussed the following assessment and plan:  Chronic pain of right lower extremity - Plan: Ambulatory referral to Pain Clinic  Spinal stenosis, unspecified spinal region - Plan: Ambulatory referral to Pain Clinic  Fibromyalgia  History of revision of total replacement of right hip joint - Plan: Ambulatory referral to Pain Clinic  Right leg weakness  Anxiety and depression  Urinary incontinence, unspecified type  Patient is a generally pleasant person who unfortunately is dealing with chronic pain due, numerous chronic conditions, and stress financially and personally as she cares for her mother.  Patient understandably frustrated as hoping to find the cause of her chronic right hip/leg pain so that she is not dependent on medications.  Current quality of life due to pain is causing increased depression/anxiety symptoms.  Given patient's longstanding history of numerous surgeries, chronic pain,, etc. due to likely building tolerance to pain medications.  Higher doses will likely be required to control patient's symptoms.  Given this strongly encourage patient to reach out to pain management for assistance with medication regimen.  Will likely benefit from an extended release opioid with IR for breakthrough pain.  Per chart review referral placed early May and approved.  Patient to contact [facility] to set up appointment.  Chronic pain   Severe chronic pain in the hip and leg worsens with movement. Gabapentin , Celebrex, and Jornay are ineffective, with only temporary relief from morphine . The pain is  likely multifactorial, involving fibromyalgia, spinal issues, and hip impingement. Comprehensive pain management and further evaluation are necessary. Call [facility] for an appointment. Consider referral to a different pain management clinic if [facility] is unavailable. Order an MRI of the back to investigate spinal causes.  Fibromyalgia  Fibromyalgia contributes to chronic pain with widespread pain and tenderness, complicating the clinical picture.  Spinal stenosis   Spinal stenosis persists despite a previous lumbar laminectomy.  Several Ortho providers concerned symptoms suggest possible spinal nerve involvement. Further imaging is needed.  Has follow-up with neurosurgery next month.  Total hip arthroplasty   Multiple hip surgeries have been performed, but current pain is not attributed to hip issues. Imaging shows no derangement, though possible impingement exists. Further surgery is not recommended. Explore spinal causes first. Refer to Dr. Dayna Eve for potential iliac psoas release if necessary.  Depression/Anxiety   PHQ-9 score and GAD-7 score 24 .  depression persists despite a recent increase in [place]. She reports fatigue and is nearing a severe depressive state, with chronic pain impacting her mental health. Follow-up with a behavioral health provider is pending to adjust the treatment plan.  Advised to go to ED for continued thoughts of SI.  Urinary incontinence   Urinary incontinence is affecting her quality of life.  Bladder stem not helping.   I discussed the assessment and treatment plan with the patient. The patient was provided an opportunity to ask questions and all were answered. The patient agreed with the plan and demonstrated an understanding of the instructions.   The patient was advised to call back or seek an in-person evaluation if the symptoms worsen or if the condition fails to improve as anticipated.   Viola Greulich, MD

## 2023-08-09 NOTE — Progress Notes (Signed)
 Patient was unable to self-report due to a lack of equipment at home via telehealth

## 2023-08-10 DIAGNOSIS — F4323 Adjustment disorder with mixed anxiety and depressed mood: Secondary | ICD-10-CM | POA: Diagnosis not present

## 2023-08-10 DIAGNOSIS — F4542 Pain disorder with related psychological factors: Secondary | ICD-10-CM | POA: Diagnosis not present

## 2023-08-10 DIAGNOSIS — F607 Dependent personality disorder: Secondary | ICD-10-CM | POA: Diagnosis not present

## 2023-08-12 ENCOUNTER — Emergency Department (HOSPITAL_BASED_OUTPATIENT_CLINIC_OR_DEPARTMENT_OTHER)
Admission: EM | Admit: 2023-08-12 | Discharge: 2023-08-12 | Disposition: A | Discharge status: Home / Self Care | Attending: Emergency Medicine | Admitting: Emergency Medicine

## 2023-08-12 ENCOUNTER — Encounter (HOSPITAL_BASED_OUTPATIENT_CLINIC_OR_DEPARTMENT_OTHER): Payer: Self-pay

## 2023-08-12 ENCOUNTER — Encounter: Payer: Self-pay | Admitting: Physical Medicine & Rehabilitation

## 2023-08-12 ENCOUNTER — Other Ambulatory Visit: Payer: Self-pay

## 2023-08-12 DIAGNOSIS — M79604 Pain in right leg: Secondary | ICD-10-CM | POA: Diagnosis not present

## 2023-08-12 DIAGNOSIS — Z91010 Allergy to peanuts: Secondary | ICD-10-CM | POA: Insufficient documentation

## 2023-08-12 DIAGNOSIS — M25551 Pain in right hip: Secondary | ICD-10-CM | POA: Diagnosis not present

## 2023-08-12 DIAGNOSIS — G8929 Other chronic pain: Secondary | ICD-10-CM

## 2023-08-12 DIAGNOSIS — M6281 Muscle weakness (generalized): Secondary | ICD-10-CM | POA: Diagnosis not present

## 2023-08-12 DIAGNOSIS — R202 Paresthesia of skin: Secondary | ICD-10-CM | POA: Diagnosis not present

## 2023-08-12 MED ORDER — KETOROLAC TROMETHAMINE 15 MG/ML IJ SOLN
15.0000 mg | Freq: Once | INTRAMUSCULAR | Status: AC
  Administered 2023-08-12: 15 mg via INTRAVENOUS
  Filled 2023-08-12: qty 1

## 2023-08-12 MED ORDER — MORPHINE SULFATE (PF) 4 MG/ML IV SOLN
4.0000 mg | Freq: Once | INTRAVENOUS | Status: AC
  Administered 2023-08-12: 4 mg via INTRAVENOUS
  Filled 2023-08-12: qty 1

## 2023-08-12 MED ORDER — OXYCODONE-ACETAMINOPHEN 5-325 MG PO TABS: 1.0000 | ORAL_TABLET | Freq: Four times a day (QID) | ORAL | 0 refills | Status: DC | PRN

## 2023-08-12 NOTE — ED Provider Notes (Signed)
 [place] EMERGENCY DEPARTMENT AT [facility] Provider Note   CSN: [id number] Arrival date & time: 08/12/23  1653     History Chief Complaint  Patient presents with   Hip Pain    Abigail Gomez is a 58 y.o. female. Patient with past history significant for depression, fibromyalgia, restless leg syndrome, prior pelvic fracture presents to the ED today with concerns of hip pain. States that this has been ongoing since January of this year and has been following with orthopedics and neurosurgery with no significant improvement. She is frustrated that she feels that she is getting passed around and no one wants to help her.   Hip Pain       Home Medications Prior to Admission medications   Medication Sig Start Date End Date Taking? Authorizing Provider  oxyCODONE -acetaminophen  (PERCOCET/ROXICET) 5-325 MG tablet Take 1 tablet by mouth every 6 (six) hours as needed for severe pain (pain score 7-10). 08/12/23  Yes Alcee Sipos A, PA-C  ALPRAZolam (XANAX) 0.5 MG tablet Take 0.5 mg by mouth at bedtime as needed for anxiety.    [provider]  amitriptyline  (ELAVIL ) 10 MG tablet Take 4 tablets (40 mg total) by mouth at bedtime. 10/20/22   Viola Greulich, MD  ARMOUR THYROID  30 MG tablet TAKE 1 TABLET BY MOUTH EVERY DAY 02/19/23   Viola Greulich, MD  cholecalciferol  (VITAMIN D ) 1000 units tablet Take 5,000 Units by mouth daily.     [provider]  cyclobenzaprine  (FLEXERIL ) 10 MG tablet TAKE 1 TABLET BY MOUTH THREE TIMES A DAY AS NEEDED FOR MUSCLE SPASMS 11/02/22   Viola Greulich, MD  diazepam (VALIUM) 10 MG tablet Take 10 mg by mouth at bedtime. Vaginally Patient not taking: Reported on 08/09/2023    [provider]  estradiol  (CLIMARA  - DOSED IN MG/24 HR) 0.1 mg/24hr patch Place 0.1 mg onto the skin 2 (two) times a week.    [provider]  fluconazole  (DIFLUCAN ) 150 MG tablet Take 1 tab now.  Repeat dose in 3 days if needed. 10/11/22   Viola Greulich, MD  ketorolac  (TORADOL ) 10 MG tablet Take 1 tablet (10 mg total) by mouth every 6 (six) hours as needed. 07/13/23   Rosealee Concha, MD  lidocaine  (LIDODERM ) 5 % PLACE 1 PATCH ONTO THE SKIN DAILY. REMOVE & DISCARD PATCH WITHIN 12 HOURS OR AS DIRECTED BY MD 03/07/20   Viola Greulich, MD  losartan -hydrochlorothiazide  (HYZAAR) 100-25 MG tablet TAKE 1/2 TABLET BY MOUTH DAILY 09/05/22   Viola Greulich, MD  Lurasidone HCl (LATUDA PO) Take 30 mg by mouth daily.    [provider]  Magnesium  Amino Acid Chelate 20 % POWD Take 450 mg by mouth at bedtime.    [provider]  Melatonin 10 MG TABS Take 10 mg by mouth at bedtime.     [provider]  metoprolol  tartrate (LOPRESSOR ) 50 MG tablet TAKE 1 TABLET BY MOUTH TWICE A DAY 02/19/23   Viola Greulich, MD  nitrofurantoin, macrocrystal-monohydrate, (MACROBID) 100 MG capsule Take 100 mg by mouth.    [provider]  ondansetron  (ZOFRAN -ODT) 4 MG disintegrating tablet Take 1 tablet (4 mg total) by mouth every 8 (eight) hours as needed. 07/13/23   Rosealee Concha, MD  pantoprazole  (PROTONIX ) 40 MG tablet Take 1 tablet (40 mg total) by mouth daily. 01/04/23   Viola Greulich, MD  rizatriptan  (MAXALT -MLT) 10 MG disintegrating tablet DISSOLVE 1 TABLET BY MOUTH DAILY AS NEEDED FOR  MIGRAINE 12/18/21   Viola Greulich, MD  senna-docusate (SENOKOT-S) 8.6-50 MG tablet Take 1 tablet by mouth daily. 07/04/23   Viola Greulich, MD  sulfamethoxazole -trimethoprim  (BACTRIM  DS) 800-160 MG tablet Take 1 tablet by mouth. One tablet before having intercourse.    [provider]  Vibegron (GEMTESA) 75 MG TABS Take 75 mg by mouth daily.    [provider]  vortioxetine  HBr (TRINTELLIX ) 20 MG TABS tablet Take 20 mg by mouth daily.     [provider]  zolpidem (AMBIEN CR) 6.25 MG CR tablet Take 12.5 mg by mouth at bedtime as needed for sleep.    [provider]  zonisamide  (ZONEGRAN ) 50 MG capsule Take  150 mg by mouth at bedtime.    [provider]      Allergies    Peanut oil, Penicillins, Food, Lactose intolerance (gi), Lactulose, Lisinopril, Lyrica [pregabalin], Other, Oxycodone -acetaminophen , Allyl isothiocyanate, and Banana    Review of Systems   Review of Systems  Musculoskeletal:        Hip pain  All other systems reviewed and are negative.   Physical Exam Updated Vital Signs BP (!) 138/91   Pulse 83   Temp 98.3 F (36.8 C)   Resp 16   Ht 5\' 7"  (1.702 m)   Wt 63.5 kg   SpO2 100%   BMI 21.93 kg/m  Physical Exam Vitals and nursing note reviewed.  Constitutional:      General: She is not in acute distress.    Appearance: She is well-developed.  HENT:     Head: Normocephalic and atraumatic.  Eyes:     Conjunctiva/sclera: Conjunctivae normal.     Pupils: Pupils are equal, round, and reactive to light.  Cardiovascular:     Rate and Rhythm: Normal rate and regular rhythm.     Heart sounds: No murmur heard. Pulmonary:     Effort: Pulmonary effort is normal. No respiratory distress.     Breath sounds: Normal breath sounds.  Abdominal:     General: There is no distension.     Palpations: Abdomen is soft.     Tenderness: There is no abdominal tenderness. There is no guarding.  Musculoskeletal:        General: No swelling, deformity or signs of injury.     Cervical back: Neck supple.     Comments: No tenderness to palpation of the cervical, thoracic or lumbar spine.  Straight leg raise test negative bilaterally.  Patient endorsing mild tenderness along the lateral aspect of the right hip.  Skin:    General: Skin is warm and dry.     Capillary Refill: Capillary refill takes less than 2 seconds.     Findings: No lesion or rash.  Neurological:     General: No focal deficit present.     Mental Status: She is alert. Mental status is at baseline.  Psychiatric:        Mood and Affect: Mood normal.     ED Results / Procedures / Treatments   Labs (all labs  ordered are listed, but only abnormal results are displayed) Labs Reviewed - No data to display  EKG None  Radiology No results found.  Procedures Procedures    Medications Ordered in ED Medications  ketorolac  (TORADOL ) 15 MG/ML injection 15 mg (15 mg Intravenous Given 08/12/23 1926)  morphine  (PF) 4 MG/ML injection 4 mg (4 mg Intravenous Given 08/12/23 1927)    ED Course/ Medical Decision Making/ A&P  Medical Decision Making Risk Prescription drug management.   This patient presents to the ED for concern of hip pain. Differential diagnosis includes chronic pain, bursitis, radiculopathy, cauda equina syndrome   Medicines ordered and prescription drug management:  I ordered medication including morphine , Toradol  for pain Reevaluation of the patient after these medicines showed that the patient improved I have reviewed the patients home medicines and have made adjustments as needed   Problem List / ED Course:  Patient presents to the emergency department today with concerns of hip pain.  History significant for depression, fibromyalgia, restless leg syndrome, prior pelvic fracture.  She reports that she has been having ongoing severe flareup of her right hip pain starting since January of this year.  Reports that she is followed up with orthopedics team as well as neurosurgery and feels that she is not making progress.  Was seen by an orthopedic surgeon at [facility] a few days ago and they advised that she likely would benefit from a nerve conduction study.  She feels frustrated that she is not getting any solid answers as to what is causing her severe pain when she is not improving.  She takes Percocet at home for pain with about an hour of relief between doses of medications. On exam, patient is notably agitated but understandably so she reports that she is in severe pain.  Physical exam largely reassuring and noted as above.  Given multiple episodes  where she has come to the emergency department for similar type of pain with work performed at those times and has had no new or acute injury, do not feel that she would benefit from further imaging or lab workup at this time.  Will provide patient with a dose of morphine  and Toradol  for pain management.  Will reassess shortly. Patient's pain is improved after Toradol  and morphine .  She has previously been taking Percocet and states that she is not out of this medication.  A small amount of Percocet present to her pharmacy for continued pain management.  Advised patient to return to emergency department for any new or worsening symptoms.  Otherwise encourage close follow-up with neurosurgery and orthopedics for further management of her pain.  Patient verbalized understanding and was agreeable with current treatment plan.  Final Clinical Impression(s) / ED Diagnoses Final diagnoses:  Chronic right hip pain    Rx / DC Orders ED Discharge Orders          Ordered    oxyCODONE -acetaminophen  (PERCOCET/ROXICET) 5-325 MG tablet  Every 6 hours PRN        08/12/23 2016              Sebastien Jackson A, PA-C 08/12/23 2021    Rosealee Concha, MD 08/12/23 2147

## 2023-08-12 NOTE — ED Triage Notes (Signed)
 Pt reports chronic hip pain. Pt reports increased pain with no relief. Pt was advised by [facility] that pain was "not in her hip but rather in her back and she needs nerve conduction survey." Pt reports increased falls over the last few days.

## 2023-08-12 NOTE — ED Notes (Signed)
 Patient states here for pain control.  States drove self here and has no one who can come to drive her.

## 2023-08-12 NOTE — Discharge Instructions (Signed)
 You were seen in the ER today for concerns of hip pain. This is an obvious ongoing issue for you that needs to be addressed by your team of specialist including orthopedics and neurosurgery. Please plan on following up with them for further evaluation. Return to the ER for any concerns of new or worsening symptoms.

## 2023-08-13 DIAGNOSIS — F4323 Adjustment disorder with mixed anxiety and depressed mood: Secondary | ICD-10-CM | POA: Diagnosis not present

## 2023-08-13 DIAGNOSIS — F331 Major depressive disorder, recurrent, moderate: Secondary | ICD-10-CM | POA: Diagnosis not present

## 2023-08-13 DIAGNOSIS — F4542 Pain disorder with related psychological factors: Secondary | ICD-10-CM | POA: Diagnosis not present

## 2023-08-13 DIAGNOSIS — F411 Generalized anxiety disorder: Secondary | ICD-10-CM | POA: Diagnosis not present

## 2023-08-13 DIAGNOSIS — F3132 Bipolar disorder, current episode depressed, moderate: Secondary | ICD-10-CM | POA: Diagnosis not present

## 2023-08-13 DIAGNOSIS — F607 Dependent personality disorder: Secondary | ICD-10-CM | POA: Diagnosis not present

## 2023-08-13 DIAGNOSIS — F605 Obsessive-compulsive personality disorder: Secondary | ICD-10-CM | POA: Diagnosis not present

## 2023-08-15 DIAGNOSIS — M6281 Muscle weakness (generalized): Secondary | ICD-10-CM | POA: Diagnosis not present

## 2023-08-15 DIAGNOSIS — R202 Paresthesia of skin: Secondary | ICD-10-CM | POA: Diagnosis not present

## 2023-08-15 DIAGNOSIS — M79604 Pain in right leg: Secondary | ICD-10-CM | POA: Diagnosis not present

## 2023-08-15 DIAGNOSIS — M25551 Pain in right hip: Secondary | ICD-10-CM | POA: Diagnosis not present

## 2023-08-17 DIAGNOSIS — F4323 Adjustment disorder with mixed anxiety and depressed mood: Secondary | ICD-10-CM | POA: Diagnosis not present

## 2023-08-17 DIAGNOSIS — F607 Dependent personality disorder: Secondary | ICD-10-CM | POA: Diagnosis not present

## 2023-08-17 DIAGNOSIS — F4542 Pain disorder with related psychological factors: Secondary | ICD-10-CM | POA: Diagnosis not present

## 2023-08-19 DIAGNOSIS — M6281 Muscle weakness (generalized): Secondary | ICD-10-CM | POA: Diagnosis not present

## 2023-08-19 DIAGNOSIS — M25551 Pain in right hip: Secondary | ICD-10-CM | POA: Diagnosis not present

## 2023-08-19 DIAGNOSIS — R202 Paresthesia of skin: Secondary | ICD-10-CM | POA: Diagnosis not present

## 2023-08-19 DIAGNOSIS — M79604 Pain in right leg: Secondary | ICD-10-CM | POA: Diagnosis not present

## 2023-08-20 DIAGNOSIS — M5440 Lumbago with sciatica, unspecified side: Secondary | ICD-10-CM | POA: Diagnosis not present

## 2023-08-23 DIAGNOSIS — H35342 Macular cyst, hole, or pseudohole, left eye: Secondary | ICD-10-CM | POA: Diagnosis not present

## 2023-08-23 DIAGNOSIS — H35412 Lattice degeneration of retina, left eye: Secondary | ICD-10-CM | POA: Diagnosis not present

## 2023-08-23 DIAGNOSIS — H04123 Dry eye syndrome of bilateral lacrimal glands: Secondary | ICD-10-CM | POA: Diagnosis not present

## 2023-08-23 DIAGNOSIS — H40013 Open angle with borderline findings, low risk, bilateral: Secondary | ICD-10-CM | POA: Diagnosis not present

## 2023-08-24 DIAGNOSIS — F607 Dependent personality disorder: Secondary | ICD-10-CM | POA: Diagnosis not present

## 2023-08-24 DIAGNOSIS — F4542 Pain disorder with related psychological factors: Secondary | ICD-10-CM | POA: Diagnosis not present

## 2023-08-24 DIAGNOSIS — F4323 Adjustment disorder with mixed anxiety and depressed mood: Secondary | ICD-10-CM | POA: Diagnosis not present

## 2023-08-26 DIAGNOSIS — M6281 Muscle weakness (generalized): Secondary | ICD-10-CM | POA: Diagnosis not present

## 2023-08-26 DIAGNOSIS — M25551 Pain in right hip: Secondary | ICD-10-CM | POA: Diagnosis not present

## 2023-08-26 DIAGNOSIS — R202 Paresthesia of skin: Secondary | ICD-10-CM | POA: Diagnosis not present

## 2023-08-26 DIAGNOSIS — M79604 Pain in right leg: Secondary | ICD-10-CM | POA: Diagnosis not present

## 2023-08-28 ENCOUNTER — Telehealth: Payer: Self-pay

## 2023-08-28 DIAGNOSIS — M79604 Pain in right leg: Secondary | ICD-10-CM | POA: Diagnosis not present

## 2023-08-28 DIAGNOSIS — M6281 Muscle weakness (generalized): Secondary | ICD-10-CM | POA: Diagnosis not present

## 2023-08-28 DIAGNOSIS — M25551 Pain in right hip: Secondary | ICD-10-CM | POA: Diagnosis not present

## 2023-08-28 DIAGNOSIS — R202 Paresthesia of skin: Secondary | ICD-10-CM | POA: Diagnosis not present

## 2023-08-28 NOTE — Telephone Encounter (Signed)
 Copied from CRM [id number]. Topic: Clinical - Request for Lab/Test Order >> Aug 28, 2023 10:02 AM Artemio Larry wrote: Patient called to follow up on MyChart message that she said was sent to Dr. Arliss Lam last week, I let her know that I didn't see that a MyChart message was received. Patient would like to have gene testing done, please call her at [phone] (M)

## 2023-08-28 NOTE — Telephone Encounter (Signed)
 Copied from CRM [id number]. Topic: Clinical - Medication Question >> Aug 28, 2023 10:00 AM Juleen Oakland F wrote: Reason for CRM: Patient requested call back needing new script for verve 10's ems therapy unit that will come from a company called [employer] - she says it helps manage her back pain and was recommended by physical therapy.Please call her at [phone] (M)

## 2023-08-30 ENCOUNTER — Other Ambulatory Visit: Payer: Self-pay

## 2023-08-30 DIAGNOSIS — M6281 Muscle weakness (generalized): Secondary | ICD-10-CM | POA: Diagnosis not present

## 2023-08-30 DIAGNOSIS — M79604 Pain in right leg: Secondary | ICD-10-CM | POA: Diagnosis not present

## 2023-08-30 DIAGNOSIS — M25551 Pain in right hip: Secondary | ICD-10-CM | POA: Diagnosis not present

## 2023-08-30 DIAGNOSIS — Z5321 Procedure and treatment not carried out due to patient leaving prior to being seen by health care provider: Secondary | ICD-10-CM | POA: Insufficient documentation

## 2023-08-30 DIAGNOSIS — M545 Low back pain, unspecified: Secondary | ICD-10-CM | POA: Insufficient documentation

## 2023-08-30 DIAGNOSIS — R202 Paresthesia of skin: Secondary | ICD-10-CM | POA: Diagnosis not present

## 2023-08-30 NOTE — ED Triage Notes (Signed)
 Pt POV from home reporting persistent R hip pain and lower back pain, seen before for same, dx hip impingement, prescribed home pain meds not working.

## 2023-08-30 NOTE — Telephone Encounter (Signed)
 Given patient's ongoing history of back pain requiring injections would have her specialist complete this request.

## 2023-08-31 ENCOUNTER — Emergency Department (HOSPITAL_BASED_OUTPATIENT_CLINIC_OR_DEPARTMENT_OTHER)
Admission: EM | Admit: 2023-08-31 | Discharge: 2023-08-31 | Discharge status: Against Medical Advice | Attending: Emergency Medicine | Admitting: Emergency Medicine

## 2023-09-02 DIAGNOSIS — F607 Dependent personality disorder: Secondary | ICD-10-CM | POA: Diagnosis not present

## 2023-09-02 DIAGNOSIS — F4323 Adjustment disorder with mixed anxiety and depressed mood: Secondary | ICD-10-CM | POA: Diagnosis not present

## 2023-09-02 DIAGNOSIS — F4542 Pain disorder with related psychological factors: Secondary | ICD-10-CM | POA: Diagnosis not present

## 2023-09-03 ENCOUNTER — Other Ambulatory Visit: Payer: Self-pay | Admitting: Student

## 2023-09-03 DIAGNOSIS — M5440 Lumbago with sciatica, unspecified side: Secondary | ICD-10-CM

## 2023-09-04 ENCOUNTER — Telehealth: Payer: Self-pay

## 2023-09-04 NOTE — Telephone Encounter (Signed)
 Order placed.  Company will send to patient via mail.

## 2023-09-04 NOTE — Transitions of Care (Post Inpatient/ED Visit) (Signed)
 09/04/2023  Name: Abigail Gomez MRN: [id number] DOB: 1966-01-27  Today's TOC FU Call Status: Today's TOC FU Call Status:: Successful TOC FU Call Completed TOC FU Call Complete Date: 09/04/23 Patient's Name and Date of Birth confirmed.  Transition Care Management Follow-up Telephone Call Date of Discharge: 08/31/23 Discharge Facility: [facility] ([facility]) Type of Discharge: Emergency Department Reason for ED Visit: Other: How have you been since you were released from the hospital?: Same Any questions or concerns?: No  Items Reviewed: Did you receive and understand the discharge instructions provided?: Yes Medications obtained,verified, and reconciled?: Yes (Medications Reviewed) Any new allergies since your discharge?: No Dietary orders reviewed?: No Do you have support at home?: Yes People in Home [RPT]: sibling(s) Name of Support/Comfort Primary Source: Abigail Gomez  Medications Reviewed Today: Medications Reviewed Today     Reviewed by Elner Shan NOVAK, CMA (Certified Medical Assistant) on 09/04/23 at 1336  Med List Status: <None>   Medication Order Taking? Sig Documenting Provider Last Dose Status Informant  ALPRAZolam (XANAX) 0.5 MG tablet [id number] Yes Take 0.5 mg by mouth at bedtime as needed for anxiety. [provider]  Active   amitriptyline  (ELAVIL ) 10 MG tablet [id number] Yes Take 4 tablets (40 mg total) by mouth at bedtime. Mercer Clotilda SAUNDERS, MD  Active   ARMOUR THYROID  30 MG tablet [id number] Yes TAKE 1 TABLET BY MOUTH EVERY DAY Mercer Clotilda SAUNDERS, MD  Active   cholecalciferol  (VITAMIN D ) 1000 units tablet [id number] Yes Take 5,000 Units by mouth daily.  [provider]  Active Self  cyclobenzaprine  (FLEXERIL ) 10 MG tablet [id number] Yes TAKE 1 TABLET BY MOUTH THREE TIMES A DAY AS NEEDED FOR MUSCLE SPASMS Mercer Clotilda SAUNDERS, MD  Active   diazepam (VALIUM) 10 MG tablet [id number]  Take 10 mg by mouth at bedtime. Vaginally  Patient not taking: Reported on  09/04/2023   [provider]  Active   estradiol  (CLIMARA  - DOSED IN MG/24 HR) 0.1 mg/24hr patch [id number] Yes Place 0.1 mg onto the skin 2 (two) times a week. [provider]  Active   fluconazole  (DIFLUCAN ) 150 MG tablet [id number] Yes Take 1 tab now.  Repeat dose in 3 days if needed. Mercer Clotilda SAUNDERS, MD  Active   ketorolac  (TORADOL ) 10 MG tablet [id number] Yes Take 1 tablet (10 mg total) by mouth every 6 (six) hours as needed. Jerrol Agent, MD  Active   lidocaine  (LIDODERM ) 5 % [id number] Yes PLACE 1 PATCH ONTO THE SKIN DAILY. REMOVE & DISCARD PATCH WITHIN 12 HOURS OR AS DIRECTED BY MD Mercer Clotilda SAUNDERS, MD  Active   losartan -hydrochlorothiazide  (HYZAAR) 100-25 MG tablet [id number] Yes TAKE 1/2 TABLET BY MOUTH DAILY Mercer Clotilda SAUNDERS, MD  Active   Lurasidone HCl (LATUDA PO) [id number] Yes Take 30 mg by mouth daily. [provider]  Active   Magnesium  Amino Acid Chelate 20 % POWD [id number] Yes Take 450 mg by mouth at bedtime. [provider]  Active Self  Melatonin 10 MG TABS [id number] Yes Take 10 mg by mouth at bedtime.  [provider]  Active Self  metoprolol  tartrate (LOPRESSOR ) 50 MG tablet [id number] Yes TAKE 1 TABLET BY MOUTH TWICE A DAY Mercer Clotilda SAUNDERS, MD  Active   nitrofurantoin, macrocrystal-monohydrate, (MACROBID) 100 MG capsule [id number] Yes Take 100 mg by mouth. [provider]  Active   ondansetron  (ZOFRAN -ODT) 4 MG disintegrating tablet [id number] Yes Take 1 tablet (4 mg total) by mouth every 8 (eight) hours as needed. Jerrol Agent, MD  Active   oxyCODONE -acetaminophen  (PERCOCET/ROXICET) 5-325 MG tablet [id number] Yes Take 1 tablet by mouth every 6 (six) hours as needed for severe pain (pain score 7-10). Zelaya, Oscar A, PA-C  Active   pantoprazole  (PROTONIX ) 40 MG tablet [id number] Yes Take 1 tablet (40 mg total) by mouth daily. Mercer Clotilda SAUNDERS, MD  Active   rizatriptan  (MAXALT -MLT) 10 MG disintegrating tablet [id number] Yes DISSOLVE 1  TABLET BY MOUTH DAILY AS NEEDED FOR MIGRAINE Mercer Clotilda SAUNDERS, MD  Active   senna-docusate (SENOKOT-S) 8.6-50 MG tablet [id number] Yes Take 1 tablet by mouth daily. Mercer Clotilda SAUNDERS, MD  Active   sulfamethoxazole -trimethoprim  (BACTRIM  DS) 800-160 MG tablet [id number] Yes Take 1 tablet by mouth. One tablet before having intercourse. [provider]  Active   Vibegron (GEMTESA) 75 MG TABS [id number] Yes Take 75 mg by mouth daily. [provider]  Active   vortioxetine  HBr (TRINTELLIX ) 20 MG TABS tablet [id number] Yes Take 20 mg by mouth daily.  [provider]  Active Self  zolpidem (AMBIEN CR) 6.25 MG CR tablet [id number] Yes Take 12.5 mg by mouth at bedtime as needed for sleep. [provider]  Active   zonisamide  (ZONEGRAN ) 50 MG capsule [id number]  Take 150 mg by mouth at bedtime. [provider]  Active Self            Home Care and Equipment/Supplies: Were Home Health Services Ordered?: No Any new equipment or medical supplies ordered?: No  Functional Questionnaire: Do you need assistance with bathing/showering or dressing?: No Do you need assistance with meal preparation?: No Do you need assistance with eating?: No Do you have difficulty maintaining continence: No Do you need assistance with getting out of bed/getting out of a chair/moving?: No Do you have difficulty managing or taking your medications?: No  Follow up appointments reviewed: PCP Follow-up appointment confirmed?: NA (pt denied pcp folllow up) [facility] Follow-up appointment confirmed?: NA Do you need transportation to your follow-up appointment?: No Do you understand care options if your condition(s) worsen?: Yes-patient verbalized understanding    SIGNATURE Donye Dauenhauer, cma

## 2023-09-05 DIAGNOSIS — R202 Paresthesia of skin: Secondary | ICD-10-CM | POA: Diagnosis not present

## 2023-09-05 DIAGNOSIS — M25551 Pain in right hip: Secondary | ICD-10-CM | POA: Diagnosis not present

## 2023-09-05 DIAGNOSIS — M6281 Muscle weakness (generalized): Secondary | ICD-10-CM | POA: Diagnosis not present

## 2023-09-05 DIAGNOSIS — M79604 Pain in right leg: Secondary | ICD-10-CM | POA: Diagnosis not present

## 2023-09-07 DIAGNOSIS — F4542 Pain disorder with related psychological factors: Secondary | ICD-10-CM | POA: Diagnosis not present

## 2023-09-07 DIAGNOSIS — F4323 Adjustment disorder with mixed anxiety and depressed mood: Secondary | ICD-10-CM | POA: Diagnosis not present

## 2023-09-07 DIAGNOSIS — F607 Dependent personality disorder: Secondary | ICD-10-CM | POA: Diagnosis not present

## 2023-09-09 NOTE — Telephone Encounter (Signed)
 Order was placed and kit will be sent by company.

## 2023-09-09 NOTE — Telephone Encounter (Signed)
 Okay for referral?

## 2023-09-12 MED ORDER — OXYCODONE-ACETAMINOPHEN 5-325 MG PO TABS: 1.0000 | ORAL_TABLET | Freq: Four times a day (QID) | ORAL | 0 refills | Status: DC | PRN

## 2023-09-12 NOTE — Telephone Encounter (Signed)
 I sent in #20 for her

## 2023-09-12 NOTE — Telephone Encounter (Signed)
Patient is aware medication has been sent to the pharmacy.

## 2023-09-12 NOTE — Addendum Note (Signed)
 Addended by: JOHNNY SENIOR A on: 09/12/2023 08:52 AM   Modules accepted: Orders

## 2023-09-14 DIAGNOSIS — F607 Dependent personality disorder: Secondary | ICD-10-CM | POA: Diagnosis not present

## 2023-09-14 DIAGNOSIS — F4542 Pain disorder with related psychological factors: Secondary | ICD-10-CM | POA: Diagnosis not present

## 2023-09-14 DIAGNOSIS — F4323 Adjustment disorder with mixed anxiety and depressed mood: Secondary | ICD-10-CM | POA: Diagnosis not present

## 2023-09-15 ENCOUNTER — Other Ambulatory Visit: Payer: Self-pay | Admitting: Family Medicine

## 2023-09-18 DIAGNOSIS — F332 Major depressive disorder, recurrent severe without psychotic features: Secondary | ICD-10-CM | POA: Diagnosis not present

## 2023-09-18 DIAGNOSIS — F411 Generalized anxiety disorder: Secondary | ICD-10-CM | POA: Diagnosis not present

## 2023-09-18 DIAGNOSIS — M797 Fibromyalgia: Secondary | ICD-10-CM | POA: Diagnosis not present

## 2023-09-18 DIAGNOSIS — D573 Sickle-cell trait: Secondary | ICD-10-CM | POA: Diagnosis not present

## 2023-09-19 ENCOUNTER — Encounter: Admitting: Physical Medicine & Rehabilitation

## 2023-09-19 DIAGNOSIS — M6281 Muscle weakness (generalized): Secondary | ICD-10-CM | POA: Diagnosis not present

## 2023-09-19 DIAGNOSIS — R202 Paresthesia of skin: Secondary | ICD-10-CM | POA: Diagnosis not present

## 2023-09-19 DIAGNOSIS — M79604 Pain in right leg: Secondary | ICD-10-CM | POA: Diagnosis not present

## 2023-09-19 DIAGNOSIS — M25551 Pain in right hip: Secondary | ICD-10-CM | POA: Diagnosis not present

## 2023-09-20 ENCOUNTER — Encounter: Payer: Self-pay | Admitting: Student

## 2023-09-21 DIAGNOSIS — F4542 Pain disorder with related psychological factors: Secondary | ICD-10-CM | POA: Diagnosis not present

## 2023-09-21 DIAGNOSIS — F4323 Adjustment disorder with mixed anxiety and depressed mood: Secondary | ICD-10-CM | POA: Diagnosis not present

## 2023-09-21 DIAGNOSIS — F607 Dependent personality disorder: Secondary | ICD-10-CM | POA: Diagnosis not present

## 2023-09-23 ENCOUNTER — Ambulatory Visit
Admission: RE | Admit: 2023-09-23 | Discharge: 2023-09-23 | Disposition: A | Discharge status: Home / Self Care | Source: Ambulatory Visit | Attending: Student

## 2023-09-23 DIAGNOSIS — M47816 Spondylosis without myelopathy or radiculopathy, lumbar region: Secondary | ICD-10-CM | POA: Diagnosis not present

## 2023-09-23 DIAGNOSIS — M48061 Spinal stenosis, lumbar region without neurogenic claudication: Secondary | ICD-10-CM | POA: Diagnosis not present

## 2023-09-23 DIAGNOSIS — M5440 Lumbago with sciatica, unspecified side: Secondary | ICD-10-CM

## 2023-09-23 MED ORDER — GADOPICLENOL 0.5 MMOL/ML IV SOLN
6.0000 mL | Freq: Once | INTRAVENOUS | Status: AC | PRN
  Administered 2023-09-23: 6 mL via INTRAVENOUS

## 2023-09-25 DIAGNOSIS — F605 Obsessive-compulsive personality disorder: Secondary | ICD-10-CM | POA: Diagnosis not present

## 2023-09-25 DIAGNOSIS — F331 Major depressive disorder, recurrent, moderate: Secondary | ICD-10-CM | POA: Diagnosis not present

## 2023-09-25 DIAGNOSIS — F411 Generalized anxiety disorder: Secondary | ICD-10-CM | POA: Diagnosis not present

## 2023-09-25 DIAGNOSIS — F3132 Bipolar disorder, current episode depressed, moderate: Secondary | ICD-10-CM | POA: Diagnosis not present

## 2023-09-26 DIAGNOSIS — M48062 Spinal stenosis, lumbar region with neurogenic claudication: Secondary | ICD-10-CM | POA: Diagnosis not present

## 2023-09-26 DIAGNOSIS — M47816 Spondylosis without myelopathy or radiculopathy, lumbar region: Secondary | ICD-10-CM | POA: Diagnosis not present

## 2023-09-28 DIAGNOSIS — F4323 Adjustment disorder with mixed anxiety and depressed mood: Secondary | ICD-10-CM | POA: Diagnosis not present

## 2023-09-28 DIAGNOSIS — F607 Dependent personality disorder: Secondary | ICD-10-CM | POA: Diagnosis not present

## 2023-09-28 DIAGNOSIS — F4542 Pain disorder with related psychological factors: Secondary | ICD-10-CM | POA: Diagnosis not present

## 2023-10-04 DIAGNOSIS — F4542 Pain disorder with related psychological factors: Secondary | ICD-10-CM | POA: Diagnosis not present

## 2023-10-04 DIAGNOSIS — F4323 Adjustment disorder with mixed anxiety and depressed mood: Secondary | ICD-10-CM | POA: Diagnosis not present

## 2023-10-04 DIAGNOSIS — F607 Dependent personality disorder: Secondary | ICD-10-CM | POA: Diagnosis not present

## 2023-10-10 DIAGNOSIS — M79604 Pain in right leg: Secondary | ICD-10-CM | POA: Diagnosis not present

## 2023-10-10 DIAGNOSIS — M47816 Spondylosis without myelopathy or radiculopathy, lumbar region: Secondary | ICD-10-CM | POA: Diagnosis not present

## 2023-10-10 DIAGNOSIS — R202 Paresthesia of skin: Secondary | ICD-10-CM | POA: Diagnosis not present

## 2023-10-10 DIAGNOSIS — M25551 Pain in right hip: Secondary | ICD-10-CM | POA: Diagnosis not present

## 2023-10-10 DIAGNOSIS — M6281 Muscle weakness (generalized): Secondary | ICD-10-CM | POA: Diagnosis not present

## 2023-10-17 ENCOUNTER — Encounter: Admitting: Physical Medicine & Rehabilitation

## 2023-10-17 ENCOUNTER — Encounter: Payer: Self-pay | Admitting: Family Medicine

## 2023-10-17 ENCOUNTER — Telehealth: Admitting: Family Medicine

## 2023-10-17 DIAGNOSIS — M5440 Lumbago with sciatica, unspecified side: Secondary | ICD-10-CM | POA: Diagnosis not present

## 2023-10-17 NOTE — Progress Notes (Signed)
 Patient was unable to self-report due to a lack of equipment at home via telehealth

## 2023-10-18 ENCOUNTER — Encounter: Payer: Self-pay | Admitting: Family Medicine

## 2023-10-18 ENCOUNTER — Telehealth (INDEPENDENT_AMBULATORY_CARE_PROVIDER_SITE_OTHER): Admitting: Family Medicine

## 2023-10-18 DIAGNOSIS — M545 Low back pain, unspecified: Secondary | ICD-10-CM | POA: Insufficient documentation

## 2023-10-18 DIAGNOSIS — M2550 Pain in unspecified joint: Secondary | ICD-10-CM | POA: Diagnosis not present

## 2023-10-18 DIAGNOSIS — M79604 Pain in right leg: Secondary | ICD-10-CM

## 2023-10-18 DIAGNOSIS — Z9682 Presence of neurostimulator: Secondary | ICD-10-CM | POA: Insufficient documentation

## 2023-10-18 DIAGNOSIS — M797 Fibromyalgia: Secondary | ICD-10-CM | POA: Diagnosis not present

## 2023-10-18 DIAGNOSIS — K760 Fatty (change of) liver, not elsewhere classified: Secondary | ICD-10-CM | POA: Insufficient documentation

## 2023-10-18 DIAGNOSIS — G8929 Other chronic pain: Secondary | ICD-10-CM | POA: Insufficient documentation

## 2023-10-18 DIAGNOSIS — M48061 Spinal stenosis, lumbar region without neurogenic claudication: Secondary | ICD-10-CM | POA: Insufficient documentation

## 2023-10-18 NOTE — Progress Notes (Signed)
 Patient was unable to self-report due to a lack of equipment at home via telehealth

## 2023-10-18 NOTE — Telephone Encounter (Signed)
 Patient has appt with Dr. Mercer 8/8

## 2023-10-18 NOTE — Progress Notes (Signed)
 Virtual Visit via Video Note  I connected with Abigail Gomez on 10/18/23 at  1:30 PM EDT by a video enabled telemedicine application and verified that I am speaking with the correct person using two identifiers.  Location patient: home Location provider:work or home office Persons participating in the virtual visit: patient, provider  I discussed the limitations of evaluation and management by telemedicine and the availability of in person appointments. The patient expressed understanding and agreed to proceed.   HPI: Pt is a 58 yo female with extensive pmh with persistent back and leg pain.  Pt with shooting pain from hip into R thigh x 8 months s/p laminectomy in Dec 2024. The pain is severe, described as similar to 'having a blood clot' in her leg, and has necessitated multiple emergency room visits where she received morphine  for relief.  She is the primary caregiver for her mother, which impacts her ability to manage her own health needs. She is concerned about her ability to continue providing care if her condition worsens or if she undergoes further surgery.  [facility] felt there was impingement in R hip but pain in anterior R leg from back.  Given a letter to support disability.  Pt still in PT.  Pt had f/u with Neurosurgery, Dr. Onetha.   L3-5 shifting, worsening DDD, cysts on sacrum, nerve impingement.  Pt had to push back [facility] pain management appt until after Neurosurg appt.  Neurosurg suggested pt do pt management at their office.  Set up for injection at the end of the month.  Unsure of what type of injection it will be.  Pt had injections in the past, but the last one did not help at all.  Pt takes vicodin for fibromyalgia.  Now taking 3 pills for flare.  Gabapentin , lyrica caused s/e including wt gain.  Her past medical history includes multiple surgeries, including brain surgery and a Chiari malformation repair. She has been diagnosed with non-alcoholic fatty liver disease and  is concerned about the impact of long-term opioid use on her liver health.  Pt concerned about ongoing inflammation in jts (great toes, hips, knees).  In the past had to do lidocaine  infusion? Also doing acupuncture which has become expensive, sauna, exercising, walking, stretching, topical medications to try to manage pain.  Using narcotics for intense/breakthrough symptoms.  Started on Jornay, helped at first but no longer working.   ROS: See pertinent positives and negatives per HPI.  Past Medical History:  Diagnosis Date   Allergy    Anemia    Anxiety    Depression    Disease    lymes disease   Fibromyalgia    GERD (gastroesophageal reflux disease)    H/o Lyme disease    H/O sickle cell trait    History of pelvic fracture    History of urinary incontinence    Hypertension    Hypothyroidism    Insomnia    Labral tear of hip joint    Right Hip   Migraine    Osteoarthritis    Osteoporosis    RLS (restless legs syndrome)    Sleep apnea    cpap   Spinal stenosis    Thyroid  disease    TMJ syndrome    Vitamin D  deficiency     Past Surgical History:  Procedure Laterality Date   ABDOMINAL HYSTERECTOMY     BRAIN SURGERY  2007   Chiarimalformation   BUNIONECTOMY Bilateral    chiari malformation     ENDOMETRIAL  ABLATION     JOINT REPLACEMENT  2017,2018, 2021, 20222   MYOMECTOMY     OOPHORECTOMY     TONSILLECTOMY AND ADENOIDECTOMY     TOTAL HIP ARTHROPLASTY Right 02/28/2016   TOTAL HIP ARTHROPLASTY Right 02/28/2016   Procedure: RIGHT TOTAL HIP ARTHROPLASTY ANTERIOR APPROACH;  Surgeon: Lonni CINDERELLA Poli, MD;  Location: [facility] OR;  Service: Orthopedics;  Laterality: Right;    Family History  Problem Relation Age of Onset   Hyperlipidemia Mother    Diabetes Mother    Hearing loss Mother    Vision loss Mother    Diabetes Father    Hypertension Father    Cancer Father        prostate   Alcohol abuse Father    Vision loss Father    Diabetes Sister     Hypertension Sister    ADD / ADHD Sister    Obesity Sister    Arthritis Maternal Grandmother    Stroke Maternal Grandmother    Hypertension Maternal Grandmother    Hyperlipidemia Maternal Grandmother    Hyperkalemia Maternal Grandmother    Heart disease Maternal Grandmother    Varicose Veins Maternal Grandmother    Arthritis Paternal Grandmother    Diabetes Sister    Hearing loss Sister     SOCIAL HX: Patient in the process of applying for disability.  Previously employed as a Administrator, sports.   Current Outpatient Medications:    ALPRAZolam (XANAX) 0.5 MG tablet, Take 0.5 mg by mouth at bedtime as needed for anxiety., Disp: , Rfl:    amitriptyline  (ELAVIL ) 10 MG tablet, Take 4 tablets (40 mg total) by mouth at bedtime., Disp: 360 tablet, Rfl: 3   ARMOUR THYROID  30 MG tablet, TAKE 1 TABLET BY MOUTH EVERY DAY, Disp: 90 tablet, Rfl: 1   cholecalciferol  (VITAMIN D ) 1000 units tablet, Take 5,000 Units by mouth daily. , Disp: , Rfl:    cyclobenzaprine  (FLEXERIL ) 10 MG tablet, TAKE 1 TABLET BY MOUTH THREE TIMES A DAY AS NEEDED FOR MUSCLE SPASMS, Disp: 90 tablet, Rfl: 5   estradiol  (CLIMARA  - DOSED IN MG/24 HR) 0.1 mg/24hr patch, Place 0.1 mg onto the skin 2 (two) times a week., Disp: , Rfl:    fluconazole  (DIFLUCAN ) 150 MG tablet, Take 1 tab now.  Repeat dose in 3 days if needed., Disp: 3 tablet, Rfl: 0   ketorolac  (TORADOL ) 10 MG tablet, Take 1 tablet (10 mg total) by mouth every 6 (six) hours as needed., Disp: 20 tablet, Rfl: 0   lidocaine  (LIDODERM ) 5 %, PLACE 1 PATCH ONTO THE SKIN DAILY. REMOVE & DISCARD PATCH WITHIN 12 HOURS OR AS DIRECTED BY MD, Disp: 90 patch, Rfl: 3   losartan -hydrochlorothiazide  (HYZAAR) 100-25 MG tablet, TAKE 1/2 TABLET BY MOUTH DAILY, Disp: 45 tablet, Rfl: 1   Lurasidone HCl (LATUDA PO), Take 30 mg by mouth daily., Disp: , Rfl:    Magnesium  Amino Acid Chelate 20 % POWD, Take 450 mg by mouth at bedtime., Disp: , Rfl:    Melatonin 10 MG TABS, Take  10 mg by mouth at bedtime. , Disp: , Rfl:    metoprolol  tartrate (LOPRESSOR ) 50 MG tablet, TAKE 1 TABLET BY MOUTH TWICE A DAY, Disp: 180 tablet, Rfl: 3   nitrofurantoin, macrocrystal-monohydrate, (MACROBID) 100 MG capsule, Take 100 mg by mouth., Disp: , Rfl:    ondansetron  (ZOFRAN -ODT) 4 MG disintegrating tablet, Take 1 tablet (4 mg total) by mouth every 8 (eight) hours as needed., Disp: 20 tablet, Rfl: 0  oxyCODONE -acetaminophen  (PERCOCET/ROXICET) 5-325 MG tablet, Take 1 tablet by mouth every 6 (six) hours as needed for severe pain (pain score 7-10)., Disp: 20 tablet, Rfl: 0   pantoprazole  (PROTONIX ) 40 MG tablet, Take 1 tablet (40 mg total) by mouth daily., Disp: 90 tablet, Rfl: 1   rizatriptan  (MAXALT -MLT) 10 MG disintegrating tablet, DISSOLVE 1 TABLET BY MOUTH DAILY AS NEEDED FOR MIGRAINE, Disp: 10 tablet, Rfl: 4   senna-docusate (SENOKOT-S) 8.6-50 MG tablet, Take 1 tablet by mouth daily., Disp: 30 tablet, Rfl: 3   sulfamethoxazole -trimethoprim  (BACTRIM  DS) 800-160 MG tablet, Take 1 tablet by mouth. One tablet before having intercourse., Disp: , Rfl:    vortioxetine  HBr (TRINTELLIX ) 20 MG TABS tablet, Take 20 mg by mouth daily. , Disp: , Rfl:    zolpidem (AMBIEN CR) 6.25 MG CR tablet, Take 12.5 mg by mouth at bedtime as needed for sleep., Disp: , Rfl:    Vibegron (GEMTESA) 75 MG TABS, Take 75 mg by mouth daily., Disp: , Rfl:   EXAM:  VITALS per patient if applicable: RR between 12-20 bpm  GENERAL: alert, oriented, appears well and in no acute distress  HEENT: atraumatic, conjunctiva clear, no obvious abnormalities on inspection of external nose and ears  NECK: normal movements of the head and neck  LUNGS: on inspection no signs of respiratory distress, breathing rate appears normal, no obvious gross SOB, gasping or wheezing  CV: no obvious cyanosis  MS: moves all visible extremities without noticeable abnormality  PSYCH/NEURO: pleasant and cooperative, no obvious depression or  anxiety, speech and thought processing grossly intact  ASSESSMENT AND PLAN:  Discussed the following assessment and plan:  Chronic pain of right lower extremity  Fibromyalgia  Chronic joint pain  Chronic midline low back pain without sciatica  Lumbar foraminal stenosis  Sacral nerve stimulator present  Fatty liver  Chronic low back pain with radiculopathy, failed back surgery syndrome, lumbar spondylolisthesis, spinal instability, foraminal stenosis, and degenerative disc disease   Pain persists s/p laminectomy with MRI showing L3-L5 instability and degenerative disc disease progression, along with sacral tumors and a pinched nerve per patient.  Patient hesitant about possible restrictions after any additional surgery and if pain will be relieved.  Advised to discuss with neurosurgery.  As previous back injections were ineffective advised to send records to current provider for review prior to planned injections at the end of the month.  Continue physical therapy and alternative treatment options.  Proceed with second opinion with Dr. Carletta at [facility].  Chronic pain syndrome with opioid tolerance   Current Vicodin regimen is insufficient, raising concerns about long-term opioid use and liver health. Gabapentin  and Lyrica are ineffective. She is interested in non-opioid alternatives and low-dose naltrexone. Discuss the potential for increasing the Jornay dose with the prescribing physician, consider non-opioid alternatives for pain management, and explore if low-dose naltrexone may be beneficial in her case with pain management.  Fibromyalgia   Fibromyalgia exacerbates chronic pain, and the current Vicodin regimen is inadequate. Gabapentin  and Lyrica are not tolerated.  Advised on need to establish with a pain provider as pain contract will likely be needed.  Continue alternative treatments.  Given information about area acupuncturist.  Chronic joint pain of left knee, both hips, and both  great toes   Osteoarthritis contributes to systemic pain, managed with physical therapy and pain medications. Continue physical therapy and evaluate the effectiveness of the current pain management regimen.  Bladder dysfunction with sacral nerve stimulator   Bladder dysfunction is managed with a  sacral nerve stimulator, improving bladder control but causing pain at the stimulator site. Monitor for changes in pain or bladder function and avoid surgery to repositioning the stimulator unless necessary.  Nonalcoholic fatty liver disease   There are concerns about long-term opioid use and liver health, with previous liver enzyme abnormalities noted with Cymbalta. Monitor liver function tests regularly and discuss the potential impact of medications on liver health.    I personally spent a total of 45 minutes in the care of the patient today including preparing to see the patient, getting/reviewing separately obtained history, counseling and educating, documenting clinical information in the EHR, independently interpreting results, and communicating results.  I discussed the assessment and treatment plan with the patient. The patient was provided an opportunity to ask questions and all were answered. The patient agreed with the plan and demonstrated an understanding of the instructions.   The patient was advised to call back or seek an in-person evaluation if the symptoms worsen or if the condition fails to improve as anticipated.   Clotilda JONELLE Single, MD

## 2023-10-19 DIAGNOSIS — F4542 Pain disorder with related psychological factors: Secondary | ICD-10-CM | POA: Diagnosis not present

## 2023-10-19 DIAGNOSIS — F607 Dependent personality disorder: Secondary | ICD-10-CM | POA: Diagnosis not present

## 2023-10-19 DIAGNOSIS — F4323 Adjustment disorder with mixed anxiety and depressed mood: Secondary | ICD-10-CM | POA: Diagnosis not present

## 2023-10-21 NOTE — Progress Notes (Signed)
 Erroneous encounter.  Provider unable to connect with patient.  Appointment rescheduled.

## 2023-10-26 DIAGNOSIS — F4323 Adjustment disorder with mixed anxiety and depressed mood: Secondary | ICD-10-CM | POA: Diagnosis not present

## 2023-10-26 DIAGNOSIS — F4542 Pain disorder with related psychological factors: Secondary | ICD-10-CM | POA: Diagnosis not present

## 2023-10-26 DIAGNOSIS — F607 Dependent personality disorder: Secondary | ICD-10-CM | POA: Diagnosis not present

## 2023-10-29 ENCOUNTER — Emergency Department (HOSPITAL_BASED_OUTPATIENT_CLINIC_OR_DEPARTMENT_OTHER): Admitting: Radiology

## 2023-10-29 ENCOUNTER — Encounter (HOSPITAL_BASED_OUTPATIENT_CLINIC_OR_DEPARTMENT_OTHER): Payer: Self-pay

## 2023-10-29 ENCOUNTER — Emergency Department (HOSPITAL_BASED_OUTPATIENT_CLINIC_OR_DEPARTMENT_OTHER)
Admission: EM | Admit: 2023-10-29 | Discharge: 2023-10-30 | Disposition: A | Discharge status: Home / Self Care | Attending: Emergency Medicine | Admitting: Emergency Medicine

## 2023-10-29 ENCOUNTER — Ambulatory Visit: Payer: Self-pay

## 2023-10-29 ENCOUNTER — Other Ambulatory Visit: Payer: Self-pay

## 2023-10-29 DIAGNOSIS — M542 Cervicalgia: Secondary | ICD-10-CM | POA: Diagnosis not present

## 2023-10-29 MED ORDER — MORPHINE SULFATE (PF) 4 MG/ML IV SOLN
8.0000 mg | Freq: Once | INTRAVENOUS | Status: AC
  Administered 2023-10-30: 8 mg via INTRAMUSCULAR
  Filled 2023-10-29: qty 2

## 2023-10-29 MED ORDER — PREDNISONE 20 MG PO TABS
40.0000 mg | ORAL_TABLET | Freq: Once | ORAL | Status: AC
  Administered 2023-10-30: 40 mg via ORAL
  Filled 2023-10-29: qty 2

## 2023-10-29 NOTE — Telephone Encounter (Signed)
 Called and spoke with patient, patient is having pain in the neck that shoots down the right side into her shoulder, patient is unable to turn her head, started 2 days ago after sleeping on a new pillow, patient rates the pain a 9 1/2 out of 10, patient is aware Dr. Mercer is out of the office, patient states she is going to ER and follow-up with Dr. Mercer in the next weeks

## 2023-10-29 NOTE — Telephone Encounter (Signed)
 FYI Only or Action Required?: FYI only for provider.  Patient was last seen in primary care on 10/18/2023 by Mercer Clotilda SAUNDERS, MD.  Called Nurse Triage reporting Pain.  Symptoms began several days ago.  Interventions attempted: Other: Heating pad.  Symptoms are: rapidly worsening.  Triage Disposition: Go to ED Now (Notify PCP)                  Patient/caregiver understands and will follow disposition?:  Reason for Disposition  Weakness of an arm or hand  Answer Assessment - Initial Assessment Questions ONSET: When did the pain begin?      Two nights ago  LOCATION: Where does it hurt?      Neck  PATTERN Does the pain come and go, or has it been constant since it started?      All day yesterday and got worse; can't move neck to the right  SEVERITY: How bad is the pain?  (Scale 0-10; or none or slight stiffness, mild, moderate, severe)     When pt coughs or raises hand the pain is sharp down neck and into shoulder blade; declined grip; head is tilted to right; 9/10 pain  CORD SYMPTOMS: Any weakness or numbness of the arms or legs?     Weakness   CAUSE: What do you think is causing the neck pain?     Pt bought a new pillow and didn't sleep on it properly   OTHER SYMPTOMS: Do you have any other symptoms? (e.g., headache, fever, chest pain, difficulty breathing, neck swelling)     Denies  Protocols used: Neck Pain or Stiffness-A-AH

## 2023-10-29 NOTE — Telephone Encounter (Signed)
      Call dropped prior to transfer. Copied from CRM #[id number]. Topic: Clinical - Red Word Triage >> Oct 29, 2023  8:37 AM Frederich PARAS wrote: [facility] that prompted transfer to Nurse Triage: horrific pain pt calling in says she thinks she pulled a muscle in kneck, thats going down into her  back, pt says she is in horrific pain,

## 2023-10-29 NOTE — ED Provider Notes (Signed)
 [facility] AT [facility] Provider Note   CSN: [id number] Arrival date & time: 10/29/23  2250     Patient presents with: Neck Pain   Abigail Gomez is a 58 y.o. female.  {Add pertinent medical, surgical, social history, OB history to HPI:32947} Patient is a 58 year old female with past medical history of fibromyalgia, restless legs, Sjogren's syndrome, and prior hip surgery.  Patient presenting today with complaints of neck pain.  She reports sleeping awkwardly on an ergonomic pillow several nights ago, then woke up with severe pain to the right side of her neck.  She is unable to turn her head without discomfort.  She describes pain that radiates into her right arm and feels as though her right arm is weak.  She denies any injury or trauma otherwise.  She has taken Toradol  at home with little relief.       Prior to Admission medications   Medication Sig Start Date End Date Taking? Authorizing Provider  ALPRAZolam (XANAX) 0.5 MG tablet Take 0.5 mg by mouth at bedtime as needed for anxiety.    [provider]  amitriptyline  (ELAVIL ) 10 MG tablet Take 4 tablets (40 mg total) by mouth at bedtime. 10/20/22   Mercer Clotilda SAUNDERS, MD  ARMOUR THYROID  30 MG tablet TAKE 1 TABLET BY MOUTH EVERY DAY 09/16/23   Mercer Clotilda SAUNDERS, MD  cholecalciferol  (VITAMIN D ) 1000 units tablet Take 5,000 Units by mouth daily.     [provider]  cyclobenzaprine  (FLEXERIL ) 10 MG tablet TAKE 1 TABLET BY MOUTH THREE TIMES A DAY AS NEEDED FOR MUSCLE SPASMS 11/02/22   Mercer Clotilda SAUNDERS, MD  estradiol  (CLIMARA  - DOSED IN MG/24 HR) 0.1 mg/24hr patch Place 0.1 mg onto the skin 2 (two) times a week.    [provider]  fluconazole  (DIFLUCAN ) 150 MG tablet Take 1 tab now.  Repeat dose in 3 days if needed. 10/11/22   Mercer Clotilda SAUNDERS, MD  ketorolac  (TORADOL ) 10 MG tablet Take 1 tablet (10 mg total) by mouth every 6 (six) hours as needed. 07/13/23   Jerrol Agent, MD  lidocaine   (LIDODERM ) 5 % PLACE 1 PATCH ONTO THE SKIN DAILY. REMOVE & DISCARD PATCH WITHIN 12 HOURS OR AS DIRECTED BY MD 03/07/20   Mercer Clotilda SAUNDERS, MD  losartan -hydrochlorothiazide  (HYZAAR) 100-25 MG tablet TAKE 1/2 TABLET BY MOUTH DAILY 09/16/23   Mercer Clotilda SAUNDERS, MD  Lurasidone HCl (LATUDA PO) Take 30 mg by mouth daily.    [provider]  Magnesium  Amino Acid Chelate 20 % POWD Take 450 mg by mouth at bedtime.    [provider]  Melatonin 10 MG TABS Take 10 mg by mouth at bedtime.     [provider]  metoprolol  tartrate (LOPRESSOR ) 50 MG tablet TAKE 1 TABLET BY MOUTH TWICE A DAY 02/19/23   Mercer Clotilda SAUNDERS, MD  nitrofurantoin, macrocrystal-monohydrate, (MACROBID) 100 MG capsule Take 100 mg by mouth.    [provider]  ondansetron  (ZOFRAN -ODT) 4 MG disintegrating tablet Take 1 tablet (4 mg total) by mouth every 8 (eight) hours as needed. 07/13/23   Jerrol Agent, MD  oxyCODONE -acetaminophen  (PERCOCET/ROXICET) 5-325 MG tablet Take 1 tablet by mouth every 6 (six) hours as needed for severe pain (pain score 7-10). 09/12/23   Johnny Garnette LABOR, MD  pantoprazole  (PROTONIX ) 40 MG tablet Take 1 tablet (40 mg total) by mouth daily. 01/04/23   Mercer Clotilda SAUNDERS, MD  rizatriptan  (MAXALT -MLT) 10 MG disintegrating tablet DISSOLVE 1 TABLET  BY MOUTH DAILY AS NEEDED FOR MIGRAINE 12/18/21   Mercer Clotilda SAUNDERS, MD  senna-docusate (SENOKOT-S) 8.6-50 MG tablet Take 1 tablet by mouth daily. 07/04/23   Mercer Clotilda SAUNDERS, MD  sulfamethoxazole -trimethoprim  (BACTRIM  DS) 800-160 MG tablet Take 1 tablet by mouth. One tablet before having intercourse.    [provider]  Vibegron (GEMTESA) 75 MG TABS Take 75 mg by mouth daily.    [provider]  vortioxetine  HBr (TRINTELLIX ) 20 MG TABS tablet Take 20 mg by mouth daily.     [provider]  zolpidem (AMBIEN CR) 6.25 MG CR tablet Take 12.5 mg by mouth at bedtime as needed for sleep.    [provider]    Allergies: Peanut  oil, Penicillins, Food, Lactose intolerance (gi), Lactulose, Lisinopril, Lyrica [pregabalin], Other, Oxycodone -acetaminophen , Allyl isothiocyanate, and Banana    Review of Systems  All other systems reviewed and are negative.   Updated Vital Signs BP 110/73   Pulse (!) 58   Temp 98.4 F (36.9 C) (Oral)   Resp 16   Ht 5' 8 (1.727 m)   Wt 65.8 kg   SpO2 99%   BMI 22.05 kg/m   Physical Exam Vitals and nursing note reviewed.  Constitutional:      Appearance: Normal appearance.  HENT:     Head: Normocephalic and atraumatic.  Neck:     Comments: There is tenderness to palpation in the soft tissues of the right side of the neck.  There is no palpable or visible abnormality. Pulmonary:     Effort: Pulmonary effort is normal.  Neurological:     General: No focal deficit present.     Mental Status: She is alert.     Comments: Strength is 5 out of 5 in both upper extremities.  Sensation is intact throughout both hands.     (all labs ordered are listed, but only abnormal results are displayed) Labs Reviewed - No data to display  EKG: None  Radiology: No results found.  {Document cardiac monitor, telemetry assessment procedure when appropriate:32947} Procedures   Medications Ordered in the ED  predniSONE  (DELTASONE ) tablet 40 mg (has no administration in time range)  morphine  (PF) 4 MG/ML injection 8 mg (has no administration in time range)      {Click here for ABCD2, HEART and other calculators REFRESH Note before signing:1}                              Medical Decision Making Amount and/or Complexity of Data Reviewed Radiology: ordered.  Risk Prescription drug management.   ***  {Document critical care time when appropriate  Document review of labs and clinical decision tools ie CHADS2VASC2, etc  Document your independent review of radiology images and any outside records  Document your discussion with family members, caretakers and with consultants   Document social determinants of health affecting pt's care  Document your decision making why or why not admission, treatments were needed:32947:::1}   Final diagnoses:  None    ED Discharge Orders     None

## 2023-10-29 NOTE — ED Triage Notes (Signed)
 Pt presents via POV c/o right sided neck pain extending into her right shoulder/arm. Reports slept wrong on her neck and has had pain x2 days. Reports pain with movement. Reports previous laminectomy in L3-L5. A&O x4. Ambulatory to triage.

## 2023-10-30 DIAGNOSIS — M5030 Other cervical disc degeneration, unspecified cervical region: Secondary | ICD-10-CM | POA: Diagnosis not present

## 2023-10-30 DIAGNOSIS — M4802 Spinal stenosis, cervical region: Secondary | ICD-10-CM | POA: Diagnosis not present

## 2023-10-30 MED ORDER — PREDNISONE 10 MG PO TABS: 20.0000 mg | ORAL_TABLET | Freq: Two times a day (BID) | ORAL | 0 refills | Status: DC

## 2023-10-30 MED ORDER — OXYCODONE-ACETAMINOPHEN 5-325 MG PO TABS: 1.0000 | ORAL_TABLET | Freq: Four times a day (QID) | ORAL | 0 refills | Status: DC | PRN

## 2023-10-30 NOTE — Discharge Instructions (Signed)
 Begin taking prednisone as prescribed.  Begin taking Percocet as prescribed as needed for pain.  Follow-up with primary doctor if symptoms are not improving in the next week.

## 2023-11-01 DIAGNOSIS — M79604 Pain in right leg: Secondary | ICD-10-CM | POA: Diagnosis not present

## 2023-11-01 DIAGNOSIS — M25551 Pain in right hip: Secondary | ICD-10-CM | POA: Diagnosis not present

## 2023-11-01 DIAGNOSIS — R202 Paresthesia of skin: Secondary | ICD-10-CM | POA: Diagnosis not present

## 2023-11-01 DIAGNOSIS — M6281 Muscle weakness (generalized): Secondary | ICD-10-CM | POA: Diagnosis not present

## 2023-11-02 DIAGNOSIS — F4323 Adjustment disorder with mixed anxiety and depressed mood: Secondary | ICD-10-CM | POA: Diagnosis not present

## 2023-11-02 DIAGNOSIS — F607 Dependent personality disorder: Secondary | ICD-10-CM | POA: Diagnosis not present

## 2023-11-02 DIAGNOSIS — F4542 Pain disorder with related psychological factors: Secondary | ICD-10-CM | POA: Diagnosis not present

## 2023-11-09 DIAGNOSIS — F4323 Adjustment disorder with mixed anxiety and depressed mood: Secondary | ICD-10-CM | POA: Diagnosis not present

## 2023-11-09 DIAGNOSIS — F607 Dependent personality disorder: Secondary | ICD-10-CM | POA: Diagnosis not present

## 2023-11-09 DIAGNOSIS — F4542 Pain disorder with related psychological factors: Secondary | ICD-10-CM | POA: Diagnosis not present

## 2023-11-15 DIAGNOSIS — F4542 Pain disorder with related psychological factors: Secondary | ICD-10-CM | POA: Diagnosis not present

## 2023-11-15 DIAGNOSIS — F607 Dependent personality disorder: Secondary | ICD-10-CM | POA: Diagnosis not present

## 2023-11-15 DIAGNOSIS — F4323 Adjustment disorder with mixed anxiety and depressed mood: Secondary | ICD-10-CM | POA: Diagnosis not present

## 2023-11-30 DIAGNOSIS — F4542 Pain disorder with related psychological factors: Secondary | ICD-10-CM | POA: Diagnosis not present

## 2023-11-30 DIAGNOSIS — F4323 Adjustment disorder with mixed anxiety and depressed mood: Secondary | ICD-10-CM | POA: Diagnosis not present

## 2023-11-30 DIAGNOSIS — F607 Dependent personality disorder: Secondary | ICD-10-CM | POA: Diagnosis not present

## 2023-12-18 DIAGNOSIS — F4542 Pain disorder with related psychological factors: Secondary | ICD-10-CM | POA: Diagnosis not present

## 2023-12-18 DIAGNOSIS — F4323 Adjustment disorder with mixed anxiety and depressed mood: Secondary | ICD-10-CM | POA: Diagnosis not present

## 2023-12-18 DIAGNOSIS — F607 Dependent personality disorder: Secondary | ICD-10-CM | POA: Diagnosis not present

## 2023-12-19 ENCOUNTER — Other Ambulatory Visit: Payer: Self-pay | Admitting: Neurosurgery

## 2023-12-20 ENCOUNTER — Other Ambulatory Visit: Payer: Self-pay | Admitting: Family Medicine

## 2023-12-20 DIAGNOSIS — M797 Fibromyalgia: Secondary | ICD-10-CM

## 2023-12-24 ENCOUNTER — Other Ambulatory Visit: Payer: Self-pay | Admitting: Family Medicine

## 2023-12-24 ENCOUNTER — Telehealth: Payer: Self-pay

## 2023-12-24 DIAGNOSIS — M79604 Pain in right leg: Secondary | ICD-10-CM | POA: Diagnosis not present

## 2023-12-24 DIAGNOSIS — G8929 Other chronic pain: Secondary | ICD-10-CM

## 2023-12-24 DIAGNOSIS — R202 Paresthesia of skin: Secondary | ICD-10-CM | POA: Diagnosis not present

## 2023-12-24 DIAGNOSIS — M25551 Pain in right hip: Secondary | ICD-10-CM | POA: Diagnosis not present

## 2023-12-24 DIAGNOSIS — M51369 Other intervertebral disc degeneration, lumbar region without mention of lumbar back pain or lower extremity pain: Secondary | ICD-10-CM

## 2023-12-24 DIAGNOSIS — M6281 Muscle weakness (generalized): Secondary | ICD-10-CM | POA: Diagnosis not present

## 2023-12-24 DIAGNOSIS — M4800 Spinal stenosis, site unspecified: Secondary | ICD-10-CM

## 2023-12-24 NOTE — Telephone Encounter (Unsigned)
 Copied from CRM [id number]. Topic: Clinical - Medication Refill >> Dec 24, 2023  8:35 AM Frederich PARAS wrote: Medication: armour thyroid    Has the patient contacted their pharmacy? No  This is the patient's preferred pharmacy:  [id number] [place], [place] - [address] [place] [address] Phone: [phone] Fax: [phone]  Is this the correct pharmacy for this prescription? Yes If no, delete pharmacy and type the correct one.   Has the prescription been filled recently? Yes  Is the patient out of the medication? No  Has the patient been seen for an appointment in the last year OR does the patient have an upcoming appointment? Yes  Can we respond through MyChart? No  Agent: Please be advised that Rx refills may take up to 3 business days. We ask that you follow-up with your pharmacy.

## 2023-12-24 NOTE — Telephone Encounter (Signed)
 Copied from CRM [id number]. Topic: Clinical - Medication Question >> Dec 24, 2023  8:43 AM Frederich PARAS wrote: Reason for CRM: pt says she have asked dr. To refill her medication 3 times for the hydrocodone . I  looked in medication I did not see it listed as her medication. Please reach out to pt in regards to [facility]. Pt callback # is [phone]

## 2023-12-25 MED ORDER — THYROID 30 MG PO TABS: 30.0000 mg | ORAL_TABLET | Freq: Every day | ORAL | 1 refills | Status: AC

## 2023-12-26 ENCOUNTER — Telehealth: Payer: Self-pay | Admitting: Family Medicine

## 2023-12-26 DIAGNOSIS — M79604 Pain in right leg: Secondary | ICD-10-CM | POA: Diagnosis not present

## 2023-12-26 DIAGNOSIS — R202 Paresthesia of skin: Secondary | ICD-10-CM | POA: Diagnosis not present

## 2023-12-26 DIAGNOSIS — M6281 Muscle weakness (generalized): Secondary | ICD-10-CM | POA: Diagnosis not present

## 2023-12-26 DIAGNOSIS — M25551 Pain in right hip: Secondary | ICD-10-CM | POA: Diagnosis not present

## 2023-12-26 DIAGNOSIS — F411 Generalized anxiety disorder: Secondary | ICD-10-CM | POA: Diagnosis not present

## 2023-12-26 DIAGNOSIS — F331 Major depressive disorder, recurrent, moderate: Secondary | ICD-10-CM | POA: Diagnosis not present

## 2023-12-26 DIAGNOSIS — F607 Dependent personality disorder: Secondary | ICD-10-CM | POA: Diagnosis not present

## 2023-12-26 DIAGNOSIS — G894 Chronic pain syndrome: Secondary | ICD-10-CM | POA: Diagnosis not present

## 2023-12-26 NOTE — Telephone Encounter (Signed)
 Called and spoke with patient, per Dr. Mercer patient has been sent to pain management. Patient is aware, and is very upset and would like to speak with Dr. Mercer, patent has an appt 10/17

## 2023-12-26 NOTE — Telephone Encounter (Signed)
 Prescription Request  12/26/2023  LOV: 07/04/2023  What is the name of the medication or equipment? Hydrocodone   Have you contacted your pharmacy to request a refill? No   Which pharmacy would you like this sent to?  [id number] [place], [place] - [address] [place] [address] Phone: [phone] Fax: [phone]    Patient notified that their request is being sent to the clinical staff for review and that they should receive a response within 2 business days.   Please advise at Mobile [phone] (mobile)   Pt called in very upset stating that she feels like she is being ignored since no one has reached back out to her about this refill or at least get it done. Let pt know that the times she has called in were for other meds and not Hydrocodone  but pt assisted that wasn't the case.   Rea will be reaching out to pt concerning getting this med refill after speaking with Dr.  Please advise.

## 2023-12-27 ENCOUNTER — Telehealth (INDEPENDENT_AMBULATORY_CARE_PROVIDER_SITE_OTHER): Admitting: Family Medicine

## 2023-12-27 ENCOUNTER — Encounter: Payer: Self-pay | Admitting: Family Medicine

## 2023-12-27 VITALS — BP 137/86 | Ht 68.0 in | Wt 142.0 lb

## 2023-12-27 DIAGNOSIS — M797 Fibromyalgia: Secondary | ICD-10-CM | POA: Diagnosis not present

## 2023-12-27 DIAGNOSIS — R32 Unspecified urinary incontinence: Secondary | ICD-10-CM

## 2023-12-27 DIAGNOSIS — M2550 Pain in unspecified joint: Secondary | ICD-10-CM | POA: Diagnosis not present

## 2023-12-27 DIAGNOSIS — K760 Fatty (change of) liver, not elsewhere classified: Secondary | ICD-10-CM

## 2023-12-27 DIAGNOSIS — G8929 Other chronic pain: Secondary | ICD-10-CM

## 2023-12-27 DIAGNOSIS — F5101 Primary insomnia: Secondary | ICD-10-CM

## 2023-12-27 MED ORDER — AMITRIPTYLINE HCL 50 MG PO TABS: 50.0000 mg | ORAL_TABLET | Freq: Every day | ORAL | 3 refills | Status: DC

## 2023-12-27 NOTE — Progress Notes (Signed)
 Virtual Visit via Video Note  I connected with Abigail Gomez on 12/27/23 at  4:00 PM EDT by a video enabled telemedicine application and verified that I am speaking with the correct person using two identifiers.  Location patient: home Location provider:work or home office Persons participating in the virtual visit: patient, provider  I discussed the limitations of evaluation and management by telemedicine and the availability of in person appointments. The patient expressed understanding and agreed to proceed. Chief Complaint  Patient presents with   Medical Management of Chronic Issues    Pain management, Thyroid  medication options, discus labs for ANA     HPI:  Pt is a 58 yo female with h/o chronic pain, migraines, HTN, OSA, GERD, fatty liver, hypothyroidism,  lumbar foraminal stenosis, OA of R hip, Sjogren's, bursitis, fibromyalgia, urinary incontinence, anxiety, depression, HLD, insomnia who presents for f/u on chronic medical conditions.  Tried acupuncture but aggrevated nerve in back.  Has decided to get the spinal fusion.  Planned for Nov 3rd.  Naltrexone from [email or web address]  Her fibromyalgia is exacerbated by her back issues, leading to frequent flares. She manages these with medications and natural supplements, including amitriptyline , which she recently increased to 50 mg.  This  improved sleep. She also uses Xanax, L-theoline, magnesium  glycinate, and ashwagandha. She prefers non-opioid treatments like Jornavx and is resistant to opioid use.  She has a bladder stimulator that causes discomfort but prefers to keep it to avoid incontinence.  Latuda 40 mg BID, just increased by psychiatry.    Amitriptyline  took an extra 10 mg dose.  50 mg helped her sleep.    Concerned armor thyroid  will be d/c.  Inquires about alternatives if needed.   ROS: See pertinent positives and negatives per HPI.  Past Medical History:  Diagnosis Date   Allergy    Anemia    Anxiety    Depression     Disease    lymes disease   Fibromyalgia    GERD (gastroesophageal reflux disease)    H/o Lyme disease    H/O sickle cell trait    History of pelvic fracture    History of urinary incontinence    Hypertension    Hypothyroidism    Insomnia    Labral tear of hip joint    Right Hip   Migraine    Osteoarthritis    Osteoporosis    RLS (restless legs syndrome)    Sleep apnea    cpap   Spinal stenosis    Thyroid  disease    TMJ syndrome    Vitamin D  deficiency     Past Surgical History:  Procedure Laterality Date   ABDOMINAL HYSTERECTOMY     BRAIN SURGERY  2007   Chiarimalformation   BUNIONECTOMY Bilateral    chiari malformation     ENDOMETRIAL ABLATION     JOINT REPLACEMENT  2017,2018, 2021, 20222   MYOMECTOMY     OOPHORECTOMY     TONSILLECTOMY AND ADENOIDECTOMY     TOTAL HIP ARTHROPLASTY Right 02/28/2016   TOTAL HIP ARTHROPLASTY Right 02/28/2016   Procedure: RIGHT TOTAL HIP ARTHROPLASTY ANTERIOR APPROACH;  Surgeon: Lonni CINDERELLA Poli, MD;  Location: [facility] OR;  Service: Orthopedics;  Laterality: Right;    Family History  Problem Relation Age of Onset   Hyperlipidemia Mother    Diabetes Mother    Hearing loss Mother    Vision loss Mother    Diabetes Father    Hypertension Father    Cancer Father  prostate   Alcohol abuse Father    Vision loss Father    Diabetes Sister    Hypertension Sister    ADD / ADHD Sister    Obesity Sister    Arthritis Maternal Grandmother    Stroke Maternal Grandmother    Hypertension Maternal Grandmother    Hyperlipidemia Maternal Grandmother    Hyperkalemia Maternal Grandmother    Heart disease Maternal Grandmother    Varicose Veins Maternal Grandmother    Arthritis Paternal Grandmother    Diabetes Sister    Hearing loss Sister    Current Outpatient Medications:    ALPRAZolam (XANAX) 0.5 MG tablet, Take 0.5 mg by mouth at bedtime as needed for anxiety., Disp: , Rfl:    amitriptyline  (ELAVIL ) 50 MG tablet, Take 1 tablet  (50 mg total) by mouth at bedtime., Disp: 90 tablet, Rfl: 3   cholecalciferol  (VITAMIN D ) 1000 units tablet, Take 5,000 Units by mouth daily. , Disp: , Rfl:    cyclobenzaprine  (FLEXERIL ) 10 MG tablet, TAKE 1 TABLET BY MOUTH THREE TIMES A DAY AS NEEDED FOR MUSCLE SPASMS, Disp: 90 tablet, Rfl: 5   estradiol  (CLIMARA  - DOSED IN MG/24 HR) 0.1 mg/24hr patch, Place 0.1 mg onto the skin 2 (two) times a week., Disp: , Rfl:    lidocaine  (LIDODERM ) 5 %, PLACE 1 PATCH ONTO THE SKIN DAILY. REMOVE & DISCARD PATCH WITHIN 12 HOURS OR AS DIRECTED BY MD, Disp: 90 patch, Rfl: 3   losartan -hydrochlorothiazide  (HYZAAR) 100-25 MG tablet, TAKE 1/2 TABLET BY MOUTH DAILY, Disp: 45 tablet, Rfl: 1   Magnesium  Amino Acid Chelate 20 % POWD, Take 450 mg by mouth at bedtime., Disp: , Rfl:    Melatonin 10 MG TABS, Take 10 mg by mouth at bedtime. , Disp: , Rfl:    metoprolol  tartrate (LOPRESSOR ) 50 MG tablet, TAKE 1 TABLET BY MOUTH TWICE A DAY, Disp: 180 tablet, Rfl: 3   nitrofurantoin, macrocrystal-monohydrate, (MACROBID) 100 MG capsule, Take 100 mg by mouth. (Patient taking differently: Take 100 mg by mouth as needed.), Disp: , Rfl:    ondansetron  (ZOFRAN -ODT) 4 MG disintegrating tablet, Take 1 tablet (4 mg total) by mouth every 8 (eight) hours as needed., Disp: 20 tablet, Rfl: 0   rizatriptan  (MAXALT -MLT) 10 MG disintegrating tablet, DISSOLVE 1 TABLET BY MOUTH DAILY AS NEEDED FOR MIGRAINE, Disp: 10 tablet, Rfl: 4   Suzetrigine (JOURNAVX PO), Take 1 tablet by mouth in the morning and at bedtime., Disp: , Rfl:    thyroid  (ARMOUR THYROID ) 30 MG tablet, Take 1 tablet (30 mg total) by mouth daily., Disp: 90 tablet, Rfl: 1   zolpidem (AMBIEN CR) 12.5 MG CR tablet, Take 12.5 mg by mouth at bedtime as needed., Disp: , Rfl:    fluconazole  (DIFLUCAN ) 150 MG tablet, Take 1 tab now.  Repeat dose in 3 days if needed., Disp: 3 tablet, Rfl: 0   ketorolac  (TORADOL ) 10 MG tablet, Take 1 tablet (10 mg total) by mouth every 6 (six) hours as  needed. (Patient not taking: Reported on 12/27/2023), Disp: 20 tablet, Rfl: 0   Lurasidone HCl (LATUDA PO), Take 30 mg by mouth daily., Disp: , Rfl:    oxyCODONE -acetaminophen  (PERCOCET) 5-325 MG tablet, Take 1-2 tablets by mouth every 6 (six) hours as needed., Disp: 12 tablet, Rfl: 0   senna-docusate (SENOKOT-S) 8.6-50 MG tablet, Take 1 tablet by mouth daily., Disp: 30 tablet, Rfl: 3   sulfamethoxazole -trimethoprim  (BACTRIM  DS) 800-160 MG tablet, Take 1 tablet by mouth. One tablet before having intercourse. (Patient  not taking: Reported on 12/27/2023), Disp: , Rfl:    vortioxetine  HBr (TRINTELLIX ) 20 MG TABS tablet, Take 20 mg by mouth daily. , Disp: , Rfl:   EXAM:  VITALS per patient if applicable:  RR between 12-20 bpm  GENERAL: alert, oriented, appears well and in no acute distress  HEENT: atraumatic, conjunctiva clear, no obvious abnormalities on inspection of external nose and ears  NECK: normal movements of the head and neck  LUNGS: on inspection no signs of respiratory distress, breathing rate appears normal, no obvious gross SOB, gasping or wheezing  CV: no obvious cyanosis  MS: moves all visible extremities without noticeable abnormality  PSYCH/NEURO: pleasant and cooperative, no obvious depression or anxiety, speech and thought processing grossly intact  ASSESSMENT AND PLAN:  Discussed the following assessment and plan:  Fibromyalgia - Plan: amitriptyline  (ELAVIL ) 50 MG tablet  Chronic joint pain - Plan: ANA, Rheumatoid Factor, CANCELED: VITAMIN D  25 Hydroxy (Vit-D Deficiency, Fractures)  Primary insomnia  Urinary incontinence, unspecified type  Fatty liver  Increase elavil  from 40 mg to 50 mg at bedtime.   Pt advised to avoid self adjusting meds.  While understanding of pt's financial concerns, advised that seeing Pain management would be most appropriate for chronic pain, especially with surgery anticipated.  Pt would like to avoid narcotics.  Orders placed for  labs (ANA, RA, vit D, etc) to re-screen for autoimmune d/o.  Will have labs drawn at time of Cpe in the next few wks.  Bladder stimulator-related pain   Persistent pain is managed by adjusting stimulator settings, which she prefers over the risk of urinary incontinence.  F/u with Urology/Uro-gyn.  Hypothyroidism on desiccated thyroid  therapy   There is concern about potential discontinuation of Armour Thyroid . She had an inadequate response to levothyroxine and Synthroid previously and prefers natural thyroid  preparations. Monitor the availability of Armour Thyroid  and consider switching to another natural thyroid  preparation if necessary.  H/o fatty liver dz.  Order CMP check liver function.  Insomnia   Chronic pain exacerbates insomnia, though there is some improvement with increased amitriptyline . Increase amitriptyline  to 50 mg for better pain management and sleep.   I discussed the assessment and treatment plan with the patient. The patient was provided an opportunity to ask questions and all were answered. The patient agreed with the plan and demonstrated an understanding of the instructions.   The patient was advised to call back or seek an in-person evaluation if the symptoms worsen or if the condition fails to improve as anticipated.   Clotilda JONELLE Single, MD

## 2023-12-30 DIAGNOSIS — M6281 Muscle weakness (generalized): Secondary | ICD-10-CM | POA: Diagnosis not present

## 2023-12-30 DIAGNOSIS — M79604 Pain in right leg: Secondary | ICD-10-CM | POA: Diagnosis not present

## 2023-12-30 DIAGNOSIS — R202 Paresthesia of skin: Secondary | ICD-10-CM | POA: Diagnosis not present

## 2023-12-30 DIAGNOSIS — M25551 Pain in right hip: Secondary | ICD-10-CM | POA: Diagnosis not present

## 2023-12-31 DIAGNOSIS — M48062 Spinal stenosis, lumbar region with neurogenic claudication: Secondary | ICD-10-CM | POA: Diagnosis not present

## 2024-01-01 ENCOUNTER — Encounter: Payer: Self-pay | Admitting: Family Medicine

## 2024-01-01 ENCOUNTER — Ambulatory Visit (INDEPENDENT_AMBULATORY_CARE_PROVIDER_SITE_OTHER): Admitting: Family Medicine

## 2024-01-01 VITALS — BP 130/72 | HR 73 | Temp 98.2°F | Ht 68.0 in | Wt 148.0 lb

## 2024-01-01 DIAGNOSIS — K760 Fatty (change of) liver, not elsewhere classified: Secondary | ICD-10-CM | POA: Diagnosis not present

## 2024-01-01 DIAGNOSIS — E039 Hypothyroidism, unspecified: Secondary | ICD-10-CM

## 2024-01-01 DIAGNOSIS — G8929 Other chronic pain: Secondary | ICD-10-CM

## 2024-01-01 DIAGNOSIS — M4800 Spinal stenosis, site unspecified: Secondary | ICD-10-CM

## 2024-01-01 DIAGNOSIS — M797 Fibromyalgia: Secondary | ICD-10-CM

## 2024-01-01 DIAGNOSIS — Z0000 Encounter for general adult medical examination without abnormal findings: Secondary | ICD-10-CM

## 2024-01-01 DIAGNOSIS — Z96641 Presence of right artificial hip joint: Secondary | ICD-10-CM

## 2024-01-01 DIAGNOSIS — I10 Essential (primary) hypertension: Secondary | ICD-10-CM

## 2024-01-01 DIAGNOSIS — M2550 Pain in unspecified joint: Secondary | ICD-10-CM

## 2024-01-01 LAB — COMPREHENSIVE METABOLIC PANEL WITH GFR
ALT: 12 U/L (ref 0–35)
AST: 25 U/L (ref 0–37)
Albumin: 4.6 g/dL (ref 3.5–5.2)
Alkaline Phosphatase: 77 U/L (ref 39–117)
BUN: 14 mg/dL (ref 6–23)
CO2: 29 meq/L (ref 19–32)
Calcium: 9.4 mg/dL (ref 8.4–10.5)
Chloride: 99 meq/L (ref 96–112)
Creatinine, Ser: 1.08 mg/dL (ref 0.40–1.20)
GFR: 57.52 mL/min — ABNORMAL LOW (ref >60.00)
Glucose, Bld: 92 mg/dL (ref 70–99)
Potassium: 4 meq/L (ref 3.5–5.1)
Sodium: 136 meq/L (ref 135–145)
Total Bilirubin: 0.9 mg/dL (ref 0.2–1.2)
Total Protein: 6.9 g/dL (ref 6.0–8.3)

## 2024-01-01 LAB — LIPID PANEL
Cholesterol: 179 mg/dL (ref 0–200)
HDL: 61.2 mg/dL (ref >39.00)
LDL Cholesterol: 105 mg/dL — ABNORMAL HIGH (ref 0–99)
NonHDL: 117.39
Total CHOL/HDL Ratio: 3
Triglycerides: 61 mg/dL (ref 0.0–149.0)
VLDL: 12.2 mg/dL (ref 0.0–40.0)

## 2024-01-01 LAB — CBC WITH DIFFERENTIAL/PLATELET
Basophils Absolute: 0 K/uL (ref 0.0–0.1)
Basophils Relative: 0.4 % (ref 0.0–3.0)
Eosinophils Absolute: 0 K/uL (ref 0.0–0.7)
Eosinophils Relative: 0.5 % (ref 0.0–5.0)
HCT: 38.4 % (ref 36.0–46.0)
Hemoglobin: 12.9 g/dL (ref 12.0–15.0)
Lymphocytes Relative: 53.3 % — ABNORMAL HIGH (ref 12.0–46.0)
Lymphs Abs: 3.1 K/uL (ref 0.7–4.0)
MCHC: 33.5 g/dL (ref 30.0–36.0)
MCV: 87.8 fl (ref 78.0–100.0)
Monocytes Absolute: 0.5 K/uL (ref 0.1–1.0)
Monocytes Relative: 8.4 % (ref 3.0–12.0)
Neutro Abs: 2.2 K/uL (ref 1.4–7.7)
Neutrophils Relative %: 37.4 % — ABNORMAL LOW (ref 43.0–77.0)
Platelets: 175 K/uL (ref 150.0–400.0)
RBC: 4.37 Mil/uL (ref 3.87–5.11)
RDW: 14.5 % (ref 11.5–15.5)
WBC: 5.8 K/uL (ref 4.0–10.5)

## 2024-01-01 LAB — VITAMIN D 25 HYDROXY (VIT D DEFICIENCY, FRACTURES): VITD: 40.8 ng/mL (ref 30.00–100.00)

## 2024-01-01 LAB — VITAMIN B12: Vitamin B-12: 348 pg/mL (ref 211–911)

## 2024-01-01 LAB — TSH: TSH: 2.26 u[IU]/mL (ref 0.35–5.50)

## 2024-01-01 LAB — SEDIMENTATION RATE: Sed Rate: 12 mm/h (ref 0–30)

## 2024-01-01 LAB — T4, FREE: Free T4: 0.95 ng/dL (ref 0.60–1.60)

## 2024-01-01 LAB — C-REACTIVE PROTEIN: CRP: <0.5 mg/dL (ref 0.5–20.0)

## 2024-01-01 LAB — HEMOGLOBIN A1C: Hgb A1c MFr Bld: 5.8 % (ref 4.6–6.5)

## 2024-01-02 ENCOUNTER — Ambulatory Visit: Payer: Self-pay

## 2024-01-02 DIAGNOSIS — R202 Paresthesia of skin: Secondary | ICD-10-CM | POA: Diagnosis not present

## 2024-01-02 DIAGNOSIS — M6281 Muscle weakness (generalized): Secondary | ICD-10-CM | POA: Diagnosis not present

## 2024-01-02 DIAGNOSIS — M25551 Pain in right hip: Secondary | ICD-10-CM | POA: Diagnosis not present

## 2024-01-02 DIAGNOSIS — M79604 Pain in right leg: Secondary | ICD-10-CM | POA: Diagnosis not present

## 2024-01-02 NOTE — Telephone Encounter (Signed)
 FYI Only or Action Required?: Action required by provider: lab or test result follow-up needed and Prednisone .  Patient was last seen in primary care on 01/01/2024 by Mercer Clotilda SAUNDERS, MD.  Called Nurse Triage reporting Advice Only. Pain in neck is increasing  Symptoms began NA.  Interventions attempted: Other: Seen in office yesterday.  Symptoms are: gradually worsening.  Triage Disposition: Call PCP Now  Patient/caregiver understands and will follow disposition?: Yes - Pt would like provider to review lab results. Pt is interested in abnormal sed rate and inflammation test. PT is requesting prednisone . Please return pt's call.                 Reason for Disposition  [1] Follow-up call from patient regarding patient's clinical status AND [2] information urgent  Answer Assessment - Initial Assessment Questions 1. REASON FOR CALL: What is the main reason for your call? or How can I best help you?     Pt would like labs to be reviewed by provider and interpreted. Pt is interested in more information regarding Sed Rate, and inflammation markers.  2. SYMPTOMS : Do you have any symptoms?      Neck pain increasing 3. OTHER QUESTIONS: Do you have any other questions?     Pt is requesting prednisone  for neck pain.  Answer Assessment - Initial Assessment Questions 1. REASON FOR CALL or QUESTION: What is your reason for calling today? or How can I best     Review and interpretation of recent labs 2. CALLER: Document the source of call. (e.g., laboratory staff, caregiver or patient).     pt  Protocols used: Information Only Call - No Triage-A-AH, PCP Call - No Triage-A-AH

## 2024-01-03 ENCOUNTER — Emergency Department (HOSPITAL_BASED_OUTPATIENT_CLINIC_OR_DEPARTMENT_OTHER)
Admission: EM | Admit: 2024-01-03 | Discharge: 2024-01-03 | Disposition: A | Discharge status: Home / Self Care | Attending: Emergency Medicine | Admitting: Emergency Medicine

## 2024-01-03 ENCOUNTER — Encounter (HOSPITAL_BASED_OUTPATIENT_CLINIC_OR_DEPARTMENT_OTHER): Payer: Self-pay

## 2024-01-03 ENCOUNTER — Other Ambulatory Visit: Payer: Self-pay

## 2024-01-03 DIAGNOSIS — E039 Hypothyroidism, unspecified: Secondary | ICD-10-CM | POA: Diagnosis not present

## 2024-01-03 DIAGNOSIS — X500XXA Overexertion from strenuous movement or load, initial encounter: Secondary | ICD-10-CM | POA: Insufficient documentation

## 2024-01-03 DIAGNOSIS — I10 Essential (primary) hypertension: Secondary | ICD-10-CM | POA: Diagnosis not present

## 2024-01-03 DIAGNOSIS — M62838 Other muscle spasm: Secondary | ICD-10-CM | POA: Insufficient documentation

## 2024-01-03 DIAGNOSIS — Z91010 Allergy to peanuts: Secondary | ICD-10-CM | POA: Diagnosis not present

## 2024-01-03 DIAGNOSIS — Z79899 Other long term (current) drug therapy: Secondary | ICD-10-CM | POA: Diagnosis not present

## 2024-01-03 DIAGNOSIS — S161XXA Strain of muscle, fascia and tendon at neck level, initial encounter: Secondary | ICD-10-CM | POA: Diagnosis not present

## 2024-01-03 DIAGNOSIS — S199XXA Unspecified injury of neck, initial encounter: Secondary | ICD-10-CM | POA: Diagnosis not present

## 2024-01-03 LAB — ANTI-NUCLEAR AB-TITER (ANA TITER): ANA Titer 1: 1:160 {titer} — ABNORMAL HIGH

## 2024-01-03 LAB — ANA: Anti Nuclear Antibody (ANA): POSITIVE — AB

## 2024-01-03 LAB — RHEUMATOID FACTOR: Rheumatoid fact SerPl-aCnc: 10 [IU]/mL (ref <14)

## 2024-01-03 MED ORDER — TIZANIDINE HCL 2 MG PO TABS
4.0000 mg | ORAL_TABLET | Freq: Once | ORAL | Status: AC
  Administered 2024-01-03: 4 mg via ORAL
  Filled 2024-01-03: qty 2

## 2024-01-03 MED ORDER — TIZANIDINE HCL 4 MG PO TABS: 4.0000 mg | ORAL_TABLET | Freq: Three times a day (TID) | ORAL | 0 refills | Status: DC | PRN

## 2024-01-03 MED ORDER — MORPHINE SULFATE (PF) 4 MG/ML IV SOLN
4.0000 mg | Freq: Once | INTRAVENOUS | Status: AC
  Administered 2024-01-03: 4 mg via INTRAMUSCULAR
  Filled 2024-01-03: qty 1

## 2024-01-03 NOTE — ED Triage Notes (Signed)
 Pt presents via POV c/o neck pain x4 days. Reports pain across back of neck. Reports seen chiropractor for same and had some dry needling done on left side of neck. Hx of neck pain. Seen previously x1 month ago for similar episode

## 2024-01-03 NOTE — Discharge Instructions (Signed)
You have neck pain, possibly from a cervical strain and/or pinched nerve.  ° °SEEK IMMEDIATE MEDICAL ATTENTION IF: °You develop difficulties swallowing or breathing.  °You have new or worse numbness, weakness, tingling, or movement problems in your arms or legs.  °You develop increasing pain which is uncontrolled with medications.  °You have change in bowel or bladder function, or other concerns. ° ° ° °

## 2024-01-03 NOTE — ED Provider Notes (Signed)
 [place] EMERGENCY DEPARTMENT AT [facility] Provider Note   CSN: [id number] Arrival date & time: 01/03/24  9748     Patient presents with: Torticollis   Abigail Gomez is a 58 y.o. female.   The history is provided by the patient.  Patient with extensive history including chronic pain, fibromyalgia, multiple surgeries previously presents with neck pain. Patient reports over the past 3-4 days she has had increasing pain in the right side of her neck.  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  This has triggered the pain before.  She reports the pain is in her right neck and goes into her right arm and she feels that the arm is weak at times.  However this is improving.  No leg weakness, no incontinence.  Since the pain started, she was seen by chiropractor who did an adjustment and did dry needling without any improvement.  She has no new headache or visual changes.  No difficulty breathing or swallowing She reports she is running out of her Zanaflex  which at times will help her symptoms Patient is demanding morphine  for her pain    Past Medical History:  Diagnosis Date   Allergy    Anemia    Anxiety    Depression    Disease    lymes disease   Fibromyalgia    GERD (gastroesophageal reflux disease)    H/o Lyme disease    H/O sickle cell trait    History of pelvic fracture    History of urinary incontinence    Hypertension    Hypothyroidism    Insomnia    Labral tear of hip joint    Right Hip   Migraine    Osteoarthritis    Osteoporosis    RLS (restless legs syndrome)    Sleep apnea    cpap   Spinal stenosis    Thyroid  disease    TMJ syndrome    Vitamin D  deficiency     Prior to Admission medications   Medication Sig Start Date End Date Taking? Authorizing Provider  tiZANidine  (ZANAFLEX ) 4 MG tablet Take 1 tablet (4 mg total) by mouth every 8 (eight) hours as needed for muscle spasms. 01/03/24   Yes Midge Golas, MD  ALPRAZolam (XANAX) 0.5 MG tablet Take 0.5 mg by mouth at bedtime as needed for anxiety.    [provider]  amitriptyline  (ELAVIL ) 50 MG tablet Take 1 tablet (50 mg total) by mouth at bedtime. 12/27/23   Mercer Clotilda SAUNDERS, MD  cholecalciferol  (VITAMIN D ) 1000 units tablet Take 5,000 Units by mouth daily.     [provider]  estradiol  (CLIMARA  - DOSED IN MG/24 HR) 0.1 mg/24hr patch Place 0.1 mg onto the skin 2 (two) times a week.    [provider]  fluconazole  (DIFLUCAN ) 150 MG tablet Take 1 tab now.  Repeat dose in 3 days if needed. 10/11/22   Mercer Clotilda SAUNDERS, MD  lidocaine  (LIDODERM ) 5 % PLACE 1 PATCH ONTO THE SKIN DAILY. REMOVE & DISCARD PATCH WITHIN 12 HOURS OR AS DIRECTED BY MD 03/07/20   Mercer Clotilda SAUNDERS, MD  losartan -hydrochlorothiazide  (HYZAAR) 100-25 MG tablet TAKE 1/2 TABLET BY MOUTH DAILY 09/16/23   Mercer Clotilda SAUNDERS, MD  Lurasidone HCl (LATUDA PO) Take 30 mg by mouth daily.    [provider]  Magnesium  Amino Acid Chelate 20 % POWD Take 450 mg by mouth at bedtime.  [provider]  Melatonin 10 MG TABS Take 10 mg by mouth at bedtime.     [provider]  metoprolol  tartrate (LOPRESSOR ) 50 MG tablet TAKE 1 TABLET BY MOUTH TWICE A DAY 02/19/23   Mercer Clotilda SAUNDERS, MD  nitrofurantoin, macrocrystal-monohydrate, (MACROBID) 100 MG capsule Take 100 mg by mouth. Patient taking differently: Take 100 mg by mouth as needed.    [provider]  rizatriptan  (MAXALT -MLT) 10 MG disintegrating tablet DISSOLVE 1 TABLET BY MOUTH DAILY AS NEEDED FOR MIGRAINE 12/18/21   Mercer Clotilda SAUNDERS, MD  senna-docusate (SENOKOT-S) 8.6-50 MG tablet Take 1 tablet by mouth daily. 07/04/23   Mercer Clotilda SAUNDERS, MD  sulfamethoxazole -trimethoprim  (BACTRIM  DS) 800-160 MG tablet Take 1 tablet by mouth. One tablet before having intercourse.    [provider]  Suzetrigine (JOURNAVX PO) Take 1 tablet by mouth in the morning and at bedtime.  08/20/23   [provider]  thyroid  (ARMOUR THYROID ) 30 MG tablet Take 1 tablet (30 mg total) by mouth daily. 12/25/23   Mercer Clotilda SAUNDERS, MD  vortioxetine  HBr (TRINTELLIX ) 20 MG TABS tablet Take 20 mg by mouth daily.     [provider]  zolpidem (AMBIEN CR) 12.5 MG CR tablet Take 12.5 mg by mouth at bedtime as needed. 08/11/09   [provider]    Allergies: Peanut oil, Penicillins, Food, Lactose intolerance (gi), Lactulose, Lisinopril, Lyrica [pregabalin], Other, Allyl isothiocyanate, and Banana    Review of Systems  Eyes:  Negative for visual disturbance.  Musculoskeletal:  Positive for neck pain.       Chronic back pain  Neurological:  Negative for headaches.    Updated Vital Signs BP 125/82   Pulse 74   Temp 98 F (36.7 C) (Oral)   Resp 16   SpO2 100%   Physical Exam CONSTITUTIONAL: Well developed/well nourished uncomfortable. HEAD: Normocephalic/atraumatic EYES: EOMI/PERRL, no ptosis ENMT: Mucous membranes moist no stridor or drooling NECK: supple no meningeal signs no anterior neck edema or bruising, no bruits SPINE/BACK:entire spine nontender Right-sided cervical paraspinal tenderness CV: S1/S2 noted, no murmurs/rubs/gallops noted LUNGS: Lungs are clear to auscultation bilaterally, no apparent distress NEURO: Pt is awake/alert/appropriate, moves all extremitiesx4.  No facial droop.   Equal power (5/5) with hand grip, wrist flex/extension, elbow flex/extension  No focal sensory deficit to light touch is noted in either UE.  She is able to move both legs without difficulty and no focal weakness EXTREMITIES: pulses normal/equal in both upper EXTR, full ROM SKIN: warm, color normal PSYCH: Anxious  (all labs ordered are listed, but only abnormal results are displayed) Labs Reviewed - No data to display  EKG: None  Radiology: No results found.   Procedures   Medications Ordered in the ED  morphine  (PF) 4 MG/ML injection 4 mg (4 mg  Intramuscular Given 01/03/24 0557)                                    Medical Decision Making Risk Prescription drug management.   This patient presents to the ED for concern of neck pain, this involves an extensive number of treatment options, and is a complaint that carries with it a high risk of complications and morbidity.  The differential diagnosis includes but is not limited to muscle strain, muscle spasm, cervical radiculopathy, discitis, osteomyelitis, epidural abscess, carotid dissection, cervical spine fracture  Comorbidities that complicate the patient evaluation: Patient's presentation is complicated  by their history of chronic pain and fibromyalgia  Social Determinants of Health: Patient's multiple ER visits  increases the complexity of managing their presentation  Additional history obtained: Records reviewed outpatient records reviewed  Medicines ordered and prescription drug management: I ordered medication including morphine  for neck pain  Test Considered: MRI cervical spine was considered, but patient was without any focal neurodeficits, no indication at this time  Complexity of problems addressed: Patient's presentation is most consistent with  exacerbation of chronic illness  Disposition: After consideration of the diagnostic results and the patient's response to treatment,  I feel that the patent would benefit from discharge  .    Patient presents for exacerbation of her neck pain.  She was seen approximately 2 months ago for her episode.  Had negative plain imaging Reports this episode reoccurred after lifting.  She admits to weakness, but this was not demonstrated on exam.  She is able to range her neck though limited due to pain. I advised patient not to undergo any further chiropractor manipulations At her request I have refilled her Zanaflex . Patient was demanding morphine  and refused Toradol  Advised her to follow with her neurosurgeon as she may  need to have MRI C-spine as an outpatient. Of note, she just had labs done by PCP that were all unremarkable except for ANA that is still pending    Final diagnoses:  Muscle spasm  Strain of neck muscle, initial encounter    ED Discharge Orders          Ordered    tiZANidine  (ZANAFLEX ) 4 MG tablet  Every 8 hours PRN        01/03/24 0553               Midge Golas, MD 01/03/24 [phone]

## 2024-01-06 DIAGNOSIS — R202 Paresthesia of skin: Secondary | ICD-10-CM | POA: Diagnosis not present

## 2024-01-06 DIAGNOSIS — M79604 Pain in right leg: Secondary | ICD-10-CM | POA: Diagnosis not present

## 2024-01-06 DIAGNOSIS — M25551 Pain in right hip: Secondary | ICD-10-CM | POA: Diagnosis not present

## 2024-01-06 DIAGNOSIS — M6281 Muscle weakness (generalized): Secondary | ICD-10-CM | POA: Diagnosis not present

## 2024-01-07 ENCOUNTER — Encounter: Payer: Self-pay | Admitting: Family Medicine

## 2024-01-07 DIAGNOSIS — M6281 Muscle weakness (generalized): Secondary | ICD-10-CM | POA: Diagnosis not present

## 2024-01-07 DIAGNOSIS — D8989 Other specified disorders involving the immune mechanism, not elsewhere classified: Secondary | ICD-10-CM

## 2024-01-07 DIAGNOSIS — R202 Paresthesia of skin: Secondary | ICD-10-CM | POA: Diagnosis not present

## 2024-01-07 DIAGNOSIS — M79604 Pain in right leg: Secondary | ICD-10-CM | POA: Diagnosis not present

## 2024-01-07 DIAGNOSIS — M25551 Pain in right hip: Secondary | ICD-10-CM | POA: Diagnosis not present

## 2024-01-07 DIAGNOSIS — G43809 Other migraine, not intractable, without status migrainosus: Secondary | ICD-10-CM

## 2024-01-07 NOTE — Progress Notes (Signed)
 Surgical Instructions   Your procedure is scheduled on Monday January 13, 2024. Report to [facility] Main Entrance A at 7:30 A.M., then check in with the Admitting office. Any questions or running late day of surgery: call [phone]  Questions prior to your surgery date: call [phone], Monday-Friday, 8am-4pm. If you experience any cold or flu symptoms such as cough, fever, chills, shortness of breath, etc. between now and your scheduled surgery, please notify us  at the above number.     Remember:  Do not eat or drink after midnight the night before your surgery  Take these medicines the morning of surgery with A SIP OF WATER  Lurasidone HCl (LATUDA PO)  metoprolol  tartrate (LOPRESSOR )  Suzetrigine (JOURNAVX PO)  thyroid  (ARMOUR THYROID )  vortioxetine  HBr (TRINTELLIX )   May take these medicines IF NEEDED: rizatriptan  (MAXALT -MLT)  tiZANidine  (ZANAFLEX )   One week prior to surgery, STOP taking any Aspirin  (unless otherwise instructed by your surgeon) Aleve , Naproxen , Ibuprofen, Motrin, Advil, Goody's, BC's, all herbal medications, fish oil, and non-prescription vitamins.                     Do NOT Smoke (Tobacco/Vaping) for 24 hours prior to your procedure.  If you use a CPAP at night, you may bring your mask/headgear for your overnight stay.   You will be asked to remove any contacts, glasses, piercing's, hearing aid's, dentures/partials prior to surgery. Please bring cases for these items if needed.    Patients discharged the day of surgery will not be allowed to drive home, and someone needs to stay with them for 24 hours.  SURGICAL WAITING ROOM VISITATION Patients may have no more than 2 support people in the waiting area - these visitors may rotate.   Pre-op nurse will coordinate an appropriate time for 1 ADULT support person, who may not rotate, to accompany patient in pre-op.  Children under the age of 34 must have an adult with them who is not the patient and  must remain in the main waiting area with an adult.  If the patient needs to stay at the hospital during part of their recovery, the visitor guidelines for inpatient rooms apply.  Please refer to the [facility] website for the visitor guidelines for any additional information.   If you received a COVID test during your pre-op visit  it is requested that you wear a mask when out in public, stay away from anyone that may not be feeling well and notify your surgeon if you develop symptoms. If you have been in contact with anyone that has tested positive in the last 10 days please notify you surgeon.      Pre-operative 4 CHG Bathing Instructions   You can play a key role in reducing the risk of infection after surgery. Your skin needs to be as free of germs as possible. You can reduce the number of germs on your skin by washing with CHG (chlorhexidine  gluconate) soap before surgery. CHG is an antiseptic soap that kills germs and continues to kill germs even after washing.   DO NOT use if you have an allergy to chlorhexidine /CHG or antibacterial soaps. If your skin becomes reddened or irritated, stop using the CHG and notify one of our RNs at [phone].   Please shower with the CHG soap starting 4 days before surgery using the following schedule:     Please keep in mind the following:  DO NOT shave, including legs and underarms, starting the  day of your first shower.   Place clean sheets on your bed the day you start using CHG soap. Use a clean washcloth (not used since being washed) for each shower. DO NOT sleep with pets once you start using the CHG.   CHG Shower Instructions:  Wash your face and private area with normal soap. If you choose to wash your hair, wash first with your normal shampoo.  After you use shampoo/soap, rinse your hair and body thoroughly to remove shampoo/soap residue.  Turn the water OFF and apply  bottle of CHG soap to a CLEAN washcloth.  Apply CHG soap ONLY  FROM YOUR NECK DOWN TO YOUR TOES (washing for 3-5 minutes)  DO NOT use CHG soap on face, private areas, open wounds, or sores.  Pay special attention to the area where your surgery is being performed.  If you are having back surgery, having someone wash your back for you may be helpful. Wait 2 minutes after CHG soap is applied, then you may rinse off the CHG soap.  Pat dry with a clean towel  Put on clean clothes/pajamas   If you choose to wear lotion, please use ONLY the CHG-compatible lotions that are listed below.  Additional instructions for the day of surgery:  If you choose, you may shower the morning of surgery with an antibacterial soap.  DO NOT APPLY any lotions, deodorants or perfumes.   Do not bring valuables to the hospital. [facility] is not responsible for any belongings/valuables. Do not wear nail polish, gel polish, artificial nails, or any other type of covering on natural nails (fingers and toes) Do not wear jewelry or makeup Put on clean/comfortable clothes.  Please brush your teeth.  Ask your nurse before applying any prescription medications to the skin.     CHG Compatible Lotions   Aveeno Moisturizing lotion  Cetaphil Moisturizing Cream  Cetaphil Moisturizing Lotion  Clairol Herbal Essence Moisturizing Lotion, Dry Skin  Clairol Herbal Essence Moisturizing Lotion, Extra Dry Skin  Clairol Herbal Essence Moisturizing Lotion, Normal Skin  Curel Age Defying Therapeutic Moisturizing Lotion with Alpha Hydroxy  Curel Extreme Care Body Lotion  Curel Soothing Hands Moisturizing Hand Lotion  Curel Therapeutic Moisturizing Cream, Fragrance-Free  Curel Therapeutic Moisturizing Lotion, Fragrance-Free  Curel Therapeutic Moisturizing Lotion, Original Formula  Eucerin Daily Replenishing Lotion  Eucerin Dry Skin Therapy Plus Alpha Hydroxy Crme  Eucerin Dry Skin Therapy Plus Alpha Hydroxy Lotion  Eucerin Original Crme  Eucerin Original Lotion  Eucerin Plus Crme  Eucerin Plus Lotion  Eucerin TriLipid Replenishing Lotion  Keri Anti-Bacterial Hand Lotion  Keri Deep Conditioning Original Lotion Dry Skin Formula Softly Scented  Keri Deep Conditioning Original Lotion, Fragrance Free Sensitive Skin Formula  Keri Lotion Fast Absorbing Fragrance Free Sensitive Skin Formula  Keri Lotion Fast Absorbing Softly Scented Dry Skin Formula  Keri Original Lotion  Keri Skin Renewal Lotion Keri Silky Smooth Lotion  Keri Silky Smooth Sensitive Skin Lotion  Nivea Body Creamy Conditioning Oil  Nivea Body Extra Enriched Lotion  Nivea Body Original Lotion  Nivea Body Sheer Moisturizing Lotion Nivea Crme  Nivea Skin Firming Lotion  NutraDerm 30 Skin Lotion  NutraDerm Skin Lotion  NutraDerm Therapeutic Skin Cream  NutraDerm Therapeutic Skin Lotion  ProShield Protective Hand Cream  Provon moisturizing lotion  Please read over the following fact sheets that you were given.

## 2024-01-08 ENCOUNTER — Encounter (HOSPITAL_COMMUNITY)
Admission: RE | Admit: 2024-01-08 | Discharge: 2024-01-08 | Disposition: A | Discharge status: Home / Self Care | Source: Ambulatory Visit | Attending: Neurosurgery | Admitting: Neurosurgery

## 2024-01-08 ENCOUNTER — Other Ambulatory Visit: Payer: Self-pay

## 2024-01-08 ENCOUNTER — Encounter: Payer: Self-pay | Admitting: Family Medicine

## 2024-01-08 ENCOUNTER — Ambulatory Visit: Admitting: Family Medicine

## 2024-01-08 ENCOUNTER — Encounter (HOSPITAL_COMMUNITY): Payer: Self-pay

## 2024-01-08 ENCOUNTER — Ambulatory Visit: Payer: Self-pay | Admitting: Family Medicine

## 2024-01-08 VITALS — BP 106/81 | HR 62 | Temp 98.8°F | Resp 16 | Ht 68.0 in | Wt 152.8 lb

## 2024-01-08 DIAGNOSIS — Z01818 Encounter for other preprocedural examination: Secondary | ICD-10-CM | POA: Insufficient documentation

## 2024-01-08 HISTORY — DX: Fatty (change of) liver, not elsewhere classified: K76.0

## 2024-01-08 LAB — SURGICAL PCR SCREEN
MRSA, PCR: POSITIVE — AB
Staphylococcus aureus: POSITIVE — AB

## 2024-01-08 LAB — TYPE AND SCREEN
ABO/RH(D): B POS
Antibody Screen: NEGATIVE

## 2024-01-08 MED ORDER — RIZATRIPTAN BENZOATE 10 MG PO TBDP: 10.0000 mg | ORAL_TABLET | ORAL | 4 refills | Status: DC | PRN

## 2024-01-08 NOTE — Progress Notes (Signed)
 ALONSO Gavel at Dr. Eliott office made aware of + PCR for MRSA and Staph

## 2024-01-08 NOTE — Telephone Encounter (Signed)
 See other note

## 2024-01-08 NOTE — Progress Notes (Signed)
 PCP - Mercer Clotilda SAUNDERS, MD   Cardiologist -  Urological Gynecology Alvia Dorothyann Caldron, MD    [employer]ics sacral neuromodulation device placement for urgency incontinence (02-2023)  PPM/ICD - denies Device Orders - n/a Rep Notified - n/a  Chest x-ray - denies EKG - 01-08-24 Stress Test - denies ECHO - denies Cardiac Cath - denies  Sleep Study -  CPAP - per patient no longer needed  DM -denies  Blood Thinner Instructions:denies Aspirin  Instructions:denies  ERAS Protcol -NPO PRE-SURGERY Ensure or G2-   COVID TEST- n/a   Anesthesia review: Yes, Hx of HTN, ISA, Sickle cell trait. Has a bladder stimulator neuromodulation device placement for urgency incontinence placed 02-2023.  Patient aware to bring remote  Patient denies shortness of breath, fever, cough and chest pain at PAT appointment   All instructions explained to the patient, with a verbal understanding of the material. Patient agrees to go over the instructions while at home for a better understanding. Patient also instructed to self quarantine after being tested for COVID-19. The opportunity to ask questions was provided.

## 2024-01-09 DIAGNOSIS — F5104 Psychophysiologic insomnia: Secondary | ICD-10-CM | POA: Diagnosis not present

## 2024-01-09 DIAGNOSIS — M5416 Radiculopathy, lumbar region: Secondary | ICD-10-CM | POA: Diagnosis not present

## 2024-01-09 DIAGNOSIS — F32A Depression, unspecified: Secondary | ICD-10-CM | POA: Diagnosis not present

## 2024-01-09 DIAGNOSIS — M797 Fibromyalgia: Secondary | ICD-10-CM | POA: Diagnosis not present

## 2024-01-09 DIAGNOSIS — M3501 Sicca syndrome with keratoconjunctivitis: Secondary | ICD-10-CM | POA: Diagnosis not present

## 2024-01-10 DIAGNOSIS — G894 Chronic pain syndrome: Secondary | ICD-10-CM | POA: Diagnosis not present

## 2024-01-10 DIAGNOSIS — F607 Dependent personality disorder: Secondary | ICD-10-CM | POA: Diagnosis not present

## 2024-01-10 DIAGNOSIS — M79604 Pain in right leg: Secondary | ICD-10-CM | POA: Diagnosis not present

## 2024-01-10 DIAGNOSIS — M25551 Pain in right hip: Secondary | ICD-10-CM | POA: Diagnosis not present

## 2024-01-10 DIAGNOSIS — F411 Generalized anxiety disorder: Secondary | ICD-10-CM | POA: Diagnosis not present

## 2024-01-10 DIAGNOSIS — M6281 Muscle weakness (generalized): Secondary | ICD-10-CM | POA: Diagnosis not present

## 2024-01-10 DIAGNOSIS — R202 Paresthesia of skin: Secondary | ICD-10-CM | POA: Diagnosis not present

## 2024-01-10 DIAGNOSIS — F331 Major depressive disorder, recurrent, moderate: Secondary | ICD-10-CM | POA: Diagnosis not present

## 2024-01-13 ENCOUNTER — Encounter (HOSPITAL_COMMUNITY): Payer: Self-pay | Admitting: Physician Assistant

## 2024-01-13 ENCOUNTER — Encounter (HOSPITAL_COMMUNITY)
Admission: RE | Disposition: A | Discharge status: Home / Self Care | Payer: Self-pay | Source: Home / Self Care | Attending: Neurosurgery

## 2024-01-13 ENCOUNTER — Ambulatory Visit (HOSPITAL_COMMUNITY)
Admission: RE | Admit: 2024-01-13 | Discharge: 2024-01-13 | Disposition: A | Discharge status: Home / Self Care | Attending: Neurosurgery | Admitting: Neurosurgery

## 2024-01-13 ENCOUNTER — Encounter (HOSPITAL_COMMUNITY): Payer: Self-pay | Admitting: Neurosurgery

## 2024-01-13 ENCOUNTER — Other Ambulatory Visit: Payer: Self-pay

## 2024-01-13 ENCOUNTER — Encounter (HOSPITAL_COMMUNITY): Payer: Self-pay | Admitting: Anesthesiology

## 2024-01-13 DIAGNOSIS — Z5321 Procedure and treatment not carried out due to patient leaving prior to being seen by health care provider: Secondary | ICD-10-CM | POA: Insufficient documentation

## 2024-01-13 DIAGNOSIS — M48062 Spinal stenosis, lumbar region with neurogenic claudication: Secondary | ICD-10-CM | POA: Insufficient documentation

## 2024-01-13 SURGERY — POSTERIOR LUMBAR FUSION 2 LEVEL
Anesthesia: General | Site: Back

## 2024-01-13 MED ORDER — CHLORHEXIDINE GLUCONATE CLOTH 2 % EX PADS: 6.0000 | MEDICATED_PAD | Freq: Once | CUTANEOUS | Status: DC

## 2024-01-13 MED ORDER — THROMBIN 20000 UNITS EX SOLR
CUTANEOUS | Status: AC
  Filled 2024-01-13: qty 20000

## 2024-01-13 MED ORDER — BUPIVACAINE HCL (PF) 0.25 % IJ SOLN
INTRAMUSCULAR | Status: AC
  Filled 2024-01-13: qty 30

## 2024-01-13 MED ORDER — PROPOFOL 10 MG/ML IV BOLUS
INTRAVENOUS | Status: AC
  Filled 2024-01-13: qty 20

## 2024-01-13 MED ORDER — ORAL CARE MOUTH RINSE: 15.0000 mL | Freq: Once | OROMUCOSAL | Status: DC

## 2024-01-13 MED ORDER — LIDOCAINE-EPINEPHRINE 1 %-1:100000 IJ SOLN
INTRAMUSCULAR | Status: AC
  Filled 2024-01-13: qty 1

## 2024-01-13 MED ORDER — VANCOMYCIN HCL IN DEXTROSE 1-5 GM/200ML-% IV SOLN: 1000.0000 mg | INTRAVENOUS | Status: DC

## 2024-01-13 MED ORDER — BUPIVACAINE LIPOSOME 1.3 % IJ SUSP
INTRAMUSCULAR | Status: AC
  Filled 2024-01-13: qty 20

## 2024-01-13 MED ORDER — CHLORHEXIDINE GLUCONATE 0.12 % MT SOLN: 15.0000 mL | Freq: Once | OROMUCOSAL | Status: DC

## 2024-01-13 MED ORDER — LACTATED RINGERS IV SOLN: INTRAVENOUS | Status: DC

## 2024-01-13 NOTE — Progress Notes (Signed)
 patient verbalized understanding that she will not be having surgery today.  Patient stated that she already spoke with Dr. Onetha and the scheduler will communicate her new surgery date and time.

## 2024-01-13 NOTE — Anesthesia Preprocedure Evaluation (Signed)
 Anesthesia Evaluation    Reviewed: Allergy & Precautions, Patient's Chart, lab work & pertinent test results  Airway        Dental   Pulmonary neg pulmonary ROS          Cardiovascular hypertension, Pt. on medications      Neuro/Psych negative neurological ROS     GI/Hepatic negative GI ROS, Neg liver ROS,,,  Endo/Other  Hypothyroidism    Renal/GU negative Renal ROS     Musculoskeletal   Abdominal   Peds  Hematology  (+) Blood dyscrasia, Sickle cell trait   Anesthesia Other Findings Lumbar stenosis with neurogenic claudication  Reproductive/Obstetrics                              Anesthesia Physical Anesthesia Plan  ASA:   Anesthesia Plan: General   Post-op Pain Management:    Induction:   PONV Risk Score and Plan:   Airway Management Planned:   Additional Equipment:   Intra-op Plan:   Post-operative Plan:   Informed Consent:   Plan Discussed with:   Anesthesia Plan Comments:         Anesthesia Quick Evaluation

## 2024-01-13 NOTE — Progress Notes (Signed)
 Patient's arrival time was supposed to be at 730 am. Patient did not arrive in Short Stay until [phone]. Cases rearranged. Patient asked what time is surgery: Patient informed of surgery time and stated I am not going to sit here and wait. This nurse called the OR and spoke to the circulating nurse who said Dr. Onetha stated that the current case he is working on should only take about 2 hours which would make her surgery time somewhere around 1200-1230. When this nurse returned to the room the patient had taken her gown off and put on her clothes and stated I'm going home and will come back later. Informed the patient that if she leaves then her surgery could be rescheduled to even later in the day. Again, the patient stated I am not sitting here and waiting. Informed OR desk and the circulating nurse with Dr. Onetha that the patient left and wants to come back later. The circulator will let Dr. Onetha know and then let Short Stay know how Dr. Onetha wants to proceed.

## 2024-01-16 DIAGNOSIS — F331 Major depressive disorder, recurrent, moderate: Secondary | ICD-10-CM | POA: Diagnosis not present

## 2024-01-18 DIAGNOSIS — F607 Dependent personality disorder: Secondary | ICD-10-CM | POA: Diagnosis not present

## 2024-01-18 DIAGNOSIS — F331 Major depressive disorder, recurrent, moderate: Secondary | ICD-10-CM | POA: Diagnosis not present

## 2024-01-18 DIAGNOSIS — F411 Generalized anxiety disorder: Secondary | ICD-10-CM | POA: Diagnosis not present

## 2024-01-20 ENCOUNTER — Other Ambulatory Visit: Payer: Self-pay

## 2024-01-20 NOTE — Progress Notes (Signed)
 Patient updated with new arrival date and time.  Patient states that she still has her instructions from her PAT appointment and has had no changes to her medical history since she was here on 11/3.  Patient was reminded to bring bladder stimulator remote on the day of surgery.

## 2024-01-22 ENCOUNTER — Inpatient Hospital Stay (HOSPITAL_COMMUNITY)

## 2024-01-22 ENCOUNTER — Inpatient Hospital Stay (HOSPITAL_COMMUNITY)
Admission: RE | Admit: 2024-01-22 | Discharge: 2024-01-23 | DRG: 448 | Disposition: A | Discharge status: Home / Self Care | Attending: Neurosurgery | Admitting: Neurosurgery

## 2024-01-22 ENCOUNTER — Other Ambulatory Visit: Payer: Self-pay

## 2024-01-22 ENCOUNTER — Encounter (HOSPITAL_COMMUNITY)
Admission: RE | Disposition: A | Discharge status: Home / Self Care | Payer: Self-pay | Source: Home / Self Care | Attending: Neurosurgery

## 2024-01-22 DIAGNOSIS — G43909 Migraine, unspecified, not intractable, without status migrainosus: Secondary | ICD-10-CM | POA: Diagnosis not present

## 2024-01-22 DIAGNOSIS — Z888 Allergy status to other drugs, medicaments and biological substances status: Secondary | ICD-10-CM

## 2024-01-22 DIAGNOSIS — M48061 Spinal stenosis, lumbar region without neurogenic claudication: Principal | ICD-10-CM | POA: Diagnosis present

## 2024-01-22 DIAGNOSIS — Z811 Family history of alcohol abuse and dependence: Secondary | ICD-10-CM

## 2024-01-22 DIAGNOSIS — M7138 Other bursal cyst, other site: Secondary | ICD-10-CM | POA: Diagnosis not present

## 2024-01-22 DIAGNOSIS — G4730 Sleep apnea, unspecified: Secondary | ICD-10-CM | POA: Diagnosis not present

## 2024-01-22 DIAGNOSIS — G2581 Restless legs syndrome: Secondary | ICD-10-CM | POA: Diagnosis not present

## 2024-01-22 DIAGNOSIS — E739 Lactose intolerance, unspecified: Secondary | ICD-10-CM | POA: Diagnosis not present

## 2024-01-22 DIAGNOSIS — M810 Age-related osteoporosis without current pathological fracture: Secondary | ICD-10-CM | POA: Diagnosis not present

## 2024-01-22 DIAGNOSIS — Z821 Family history of blindness and visual loss: Secondary | ICD-10-CM

## 2024-01-22 DIAGNOSIS — Z91010 Allergy to peanuts: Secondary | ICD-10-CM

## 2024-01-22 DIAGNOSIS — M797 Fibromyalgia: Secondary | ICD-10-CM | POA: Diagnosis not present

## 2024-01-22 DIAGNOSIS — D573 Sickle-cell trait: Secondary | ICD-10-CM | POA: Diagnosis present

## 2024-01-22 DIAGNOSIS — M51369 Other intervertebral disc degeneration, lumbar region without mention of lumbar back pain or lower extremity pain: Principal | ICD-10-CM | POA: Diagnosis present

## 2024-01-22 DIAGNOSIS — Z96641 Presence of right artificial hip joint: Secondary | ICD-10-CM | POA: Diagnosis present

## 2024-01-22 DIAGNOSIS — Z818 Family history of other mental and behavioral disorders: Secondary | ICD-10-CM

## 2024-01-22 DIAGNOSIS — Z8249 Family history of ischemic heart disease and other diseases of the circulatory system: Secondary | ICD-10-CM

## 2024-01-22 DIAGNOSIS — Z880 Allergy status to penicillin: Secondary | ICD-10-CM | POA: Diagnosis not present

## 2024-01-22 DIAGNOSIS — F32A Depression, unspecified: Secondary | ICD-10-CM | POA: Diagnosis not present

## 2024-01-22 DIAGNOSIS — G4700 Insomnia, unspecified: Secondary | ICD-10-CM | POA: Diagnosis present

## 2024-01-22 DIAGNOSIS — Z823 Family history of stroke: Secondary | ICD-10-CM

## 2024-01-22 DIAGNOSIS — M48062 Spinal stenosis, lumbar region with neurogenic claudication: Secondary | ICD-10-CM | POA: Diagnosis not present

## 2024-01-22 DIAGNOSIS — I10 Essential (primary) hypertension: Secondary | ICD-10-CM | POA: Diagnosis present

## 2024-01-22 DIAGNOSIS — F419 Anxiety disorder, unspecified: Secondary | ICD-10-CM | POA: Diagnosis present

## 2024-01-22 DIAGNOSIS — K760 Fatty (change of) liver, not elsewhere classified: Secondary | ICD-10-CM | POA: Diagnosis not present

## 2024-01-22 DIAGNOSIS — Z8261 Family history of arthritis: Secondary | ICD-10-CM

## 2024-01-22 DIAGNOSIS — M47816 Spondylosis without myelopathy or radiculopathy, lumbar region: Secondary | ICD-10-CM | POA: Diagnosis not present

## 2024-01-22 DIAGNOSIS — Z79899 Other long term (current) drug therapy: Secondary | ICD-10-CM | POA: Diagnosis not present

## 2024-01-22 DIAGNOSIS — M4316 Spondylolisthesis, lumbar region: Secondary | ICD-10-CM | POA: Diagnosis not present

## 2024-01-22 DIAGNOSIS — Z91018 Allergy to other foods: Secondary | ICD-10-CM | POA: Diagnosis not present

## 2024-01-22 DIAGNOSIS — Z79818 Long term (current) use of other agents affecting estrogen receptors and estrogen levels: Secondary | ICD-10-CM

## 2024-01-22 DIAGNOSIS — Z833 Family history of diabetes mellitus: Secondary | ICD-10-CM

## 2024-01-22 DIAGNOSIS — Z83438 Family history of other disorder of lipoprotein metabolism and other lipidemia: Secondary | ICD-10-CM

## 2024-01-22 DIAGNOSIS — E039 Hypothyroidism, unspecified: Secondary | ICD-10-CM | POA: Diagnosis present

## 2024-01-22 DIAGNOSIS — Z822 Family history of deafness and hearing loss: Secondary | ICD-10-CM

## 2024-01-22 LAB — TYPE AND SCREEN
ABO/RH(D): B POS
Antibody Screen: NEGATIVE

## 2024-01-22 SURGERY — POSTERIOR LUMBAR FUSION 2 LEVEL
Anesthesia: General | Site: Back

## 2024-01-22 MED ORDER — HYDROCODONE-ACETAMINOPHEN 5-325 MG PO TABS: 2.0000 | ORAL_TABLET | ORAL | Status: DC | PRN

## 2024-01-22 MED ORDER — HYDROMORPHONE HCL 1 MG/ML IJ SOLN
0.5000 mg | INTRAMUSCULAR | Status: DC | PRN
  Administered 2024-01-22 – 2024-01-23 (×2): 0.5 mg via INTRAVENOUS
  Filled 2024-01-22 (×2): qty 0.5

## 2024-01-22 MED ORDER — LIDOCAINE 2% (20 MG/ML) 5 ML SYRINGE
INTRAMUSCULAR | Status: DC | PRN
  Administered 2024-01-22: 80 mg via INTRAVENOUS

## 2024-01-22 MED ORDER — CHLORHEXIDINE GLUCONATE 0.12 % MT SOLN
15.0000 mL | Freq: Once | OROMUCOSAL | Status: AC
  Administered 2024-01-22: 15 mL via OROMUCOSAL
  Filled 2024-01-22: qty 15

## 2024-01-22 MED ORDER — SENNOSIDES-DOCUSATE SODIUM 8.6-50 MG PO TABS
1.0000 | ORAL_TABLET | Freq: Every day | ORAL | Status: DC | PRN
  Filled 2024-01-22: qty 1

## 2024-01-22 MED ORDER — PANTOPRAZOLE SODIUM 40 MG IV SOLR: 40.0000 mg | Freq: Every day | INTRAVENOUS | Status: DC

## 2024-01-22 MED ORDER — THROMBIN 20000 UNITS EX SOLR
CUTANEOUS | Status: DC | PRN
  Administered 2024-01-22: 20 mL via TOPICAL

## 2024-01-22 MED ORDER — ACETAMINOPHEN 650 MG RE SUPP: 650.0000 mg | RECTAL | Status: DC | PRN

## 2024-01-22 MED ORDER — METOPROLOL TARTRATE 50 MG PO TABS
50.0000 mg | ORAL_TABLET | Freq: Two times a day (BID) | ORAL | Status: DC
  Administered 2024-01-22: 50 mg via ORAL
  Filled 2024-01-22: qty 1

## 2024-01-22 MED ORDER — LIDOCAINE 2% (20 MG/ML) 5 ML SYRINGE
INTRAMUSCULAR | Status: AC
  Filled 2024-01-22: qty 5

## 2024-01-22 MED ORDER — ALUM & MAG HYDROXIDE-SIMETH 200-200-20 MG/5ML PO SUSP: 30.0000 mL | Freq: Four times a day (QID) | ORAL | Status: DC | PRN

## 2024-01-22 MED ORDER — CLINDAMYCIN PHOSPHATE 900 MG/50ML IV SOLN
900.0000 mg | Freq: Once | INTRAVENOUS | Status: AC
  Administered 2024-01-22: 900 mg via INTRAVENOUS
  Filled 2024-01-22: qty 50

## 2024-01-22 MED ORDER — PHENYLEPHRINE HCL (PRESSORS) 10 MG/ML IV SOLN
INTRAVENOUS | Status: DC | PRN
  Administered 2024-01-22 (×2): 100 ug via INTRAVENOUS
  Administered 2024-01-22: 200 ug via INTRAVENOUS
  Administered 2024-01-22 (×4): 100 ug via INTRAVENOUS

## 2024-01-22 MED ORDER — ONDANSETRON HCL 4 MG/2ML IJ SOLN: 4.0000 mg | Freq: Four times a day (QID) | INTRAMUSCULAR | Status: DC | PRN

## 2024-01-22 MED ORDER — LIDOCAINE-EPINEPHRINE 1 %-1:100000 IJ SOLN
INTRAMUSCULAR | Status: DC | PRN
  Administered 2024-01-22: 10 mL

## 2024-01-22 MED ORDER — THROMBIN 20000 UNITS EX SOLR
CUTANEOUS | Status: AC
  Filled 2024-01-22: qty 20000

## 2024-01-22 MED ORDER — OXYCODONE HCL 5 MG PO TABS
15.0000 mg | ORAL_TABLET | ORAL | Status: DC | PRN
  Administered 2024-01-22 – 2024-01-23 (×4): 15 mg via ORAL
  Filled 2024-01-22 (×4): qty 3

## 2024-01-22 MED ORDER — PHENYLEPHRINE HCL-NACL 20-0.9 MG/250ML-% IV SOLN
INTRAVENOUS | Status: DC | PRN
  Administered 2024-01-22: 30 ug/min via INTRAVENOUS

## 2024-01-22 MED ORDER — TIZANIDINE HCL 4 MG PO TABS
4.0000 mg | ORAL_TABLET | Freq: Three times a day (TID) | ORAL | Status: DC | PRN
  Administered 2024-01-22 – 2024-01-23 (×2): 4 mg via ORAL
  Filled 2024-01-22 (×2): qty 1

## 2024-01-22 MED ORDER — SODIUM CHLORIDE 0.9 % IV SOLN: INTRAVENOUS | Status: DC | PRN

## 2024-01-22 MED ORDER — OXYCODONE HCL 5 MG/5ML PO SOLN: 5.0000 mg | Freq: Once | ORAL | Status: DC | PRN

## 2024-01-22 MED ORDER — PANTOPRAZOLE SODIUM 40 MG PO TBEC
40.0000 mg | DELAYED_RELEASE_TABLET | Freq: Every day | ORAL | Status: DC
  Administered 2024-01-22: 40 mg via ORAL
  Filled 2024-01-22: qty 1

## 2024-01-22 MED ORDER — ESTROGENS CONJUGATED 0.625 MG/GM VA CREA: 0.5000 g | TOPICAL_CREAM | VAGINAL | Status: DC

## 2024-01-22 MED ORDER — LOSARTAN POTASSIUM 50 MG PO TABS: 50.0000 mg | ORAL_TABLET | Freq: Every day | ORAL | Status: DC

## 2024-01-22 MED ORDER — ROCURONIUM BROMIDE 10 MG/ML (PF) SYRINGE
PREFILLED_SYRINGE | INTRAVENOUS | Status: AC
Start: 2024-01-22 — End: 2024-01-22
  Filled 2024-01-22: qty 10

## 2024-01-22 MED ORDER — ONDANSETRON HCL 4 MG PO TABS: 4.0000 mg | ORAL_TABLET | Freq: Four times a day (QID) | ORAL | Status: DC | PRN

## 2024-01-22 MED ORDER — ACETAMINOPHEN 10 MG/ML IV SOLN
INTRAVENOUS | Status: AC
  Filled 2024-01-22: qty 100

## 2024-01-22 MED ORDER — RIZATRIPTAN BENZOATE 10 MG PO TBDP: 10.0000 mg | ORAL_TABLET | ORAL | Status: DC | PRN

## 2024-01-22 MED ORDER — PROPOFOL 10 MG/ML IV BOLUS
INTRAVENOUS | Status: DC | PRN
  Administered 2024-01-22: 120 mg via INTRAVENOUS

## 2024-01-22 MED ORDER — ZOLPIDEM TARTRATE 5 MG PO TABS
5.0000 mg | ORAL_TABLET | Freq: Every evening | ORAL | Status: DC | PRN
Start: 2024-01-22 — End: 2024-01-23

## 2024-01-22 MED ORDER — METHOCARBAMOL 1000 MG/10ML IJ SOLN
INTRAMUSCULAR | Status: DC | PRN
  Administered 2024-01-22: 1000 mg via INTRAVENOUS

## 2024-01-22 MED ORDER — METHOCARBAMOL 1000 MG/10ML IJ SOLN
INTRAMUSCULAR | Status: AC
  Filled 2024-01-22: qty 10

## 2024-01-22 MED ORDER — NITROFURANTOIN MONOHYD MACRO 100 MG PO CAPS: 100.0000 mg | ORAL_CAPSULE | Freq: Every day | ORAL | Status: DC | PRN

## 2024-01-22 MED ORDER — MENTHOL 3 MG MT LOZG: 1.0000 | LOZENGE | OROMUCOSAL | Status: DC | PRN

## 2024-01-22 MED ORDER — HYDROCODONE-ACETAMINOPHEN 5-325 MG PO TABS: 1.0000 | ORAL_TABLET | Freq: Four times a day (QID) | ORAL | Status: DC | PRN

## 2024-01-22 MED ORDER — VANCOMYCIN HCL 1250 MG/250ML IV SOLN
1250.0000 mg | INTRAVENOUS | Status: DC
  Administered 2024-01-23: 1250 mg via INTRAVENOUS
  Filled 2024-01-22: qty 250

## 2024-01-22 MED ORDER — ROCURONIUM BROMIDE 10 MG/ML (PF) SYRINGE
PREFILLED_SYRINGE | INTRAVENOUS | Status: AC
  Filled 2024-01-22: qty 10

## 2024-01-22 MED ORDER — MIDAZOLAM HCL (PF) 2 MG/2ML IJ SOLN
INTRAMUSCULAR | Status: DC | PRN
  Administered 2024-01-22: 2 mg via INTRAVENOUS

## 2024-01-22 MED ORDER — DEXAMETHASONE SOD PHOSPHATE PF 10 MG/ML IJ SOLN
10.0000 mg | Freq: Four times a day (QID) | INTRAMUSCULAR | Status: DC
  Administered 2024-01-22 – 2024-01-23 (×3): 10 mg via INTRAVENOUS

## 2024-01-22 MED ORDER — SODIUM CHLORIDE 0.9 % IV SOLN
250.0000 mL | INTRAVENOUS | Status: DC
  Administered 2024-01-22: 250 mL via INTRAVENOUS

## 2024-01-22 MED ORDER — SENNOSIDES-DOCUSATE SODIUM 8.6-50 MG PO TABS: 1.0000 | ORAL_TABLET | Freq: Two times a day (BID) | ORAL | Status: DC | PRN

## 2024-01-22 MED ORDER — AMITRIPTYLINE HCL 50 MG PO TABS
50.0000 mg | ORAL_TABLET | Freq: Every day | ORAL | Status: DC
  Administered 2024-01-22: 50 mg via ORAL
  Filled 2024-01-22: qty 1

## 2024-01-22 MED ORDER — PROGESTERONE MICRONIZED 100 MG PO CAPS
100.0000 mg | ORAL_CAPSULE | Freq: Every day | ORAL | Status: DC
  Filled 2024-01-22: qty 1

## 2024-01-22 MED ORDER — LOSARTAN POTASSIUM-HCTZ 100-25 MG PO TABS: 0.5000 | ORAL_TABLET | Freq: Every day | ORAL | Status: DC

## 2024-01-22 MED ORDER — ALPRAZOLAM 0.5 MG PO TABS
0.5000 mg | ORAL_TABLET | Freq: Two times a day (BID) | ORAL | Status: DC | PRN
  Filled 2024-01-22: qty 1

## 2024-01-22 MED ORDER — ONDANSETRON 4 MG PO TBDP: 4.0000 mg | ORAL_TABLET | Freq: Three times a day (TID) | ORAL | Status: DC | PRN

## 2024-01-22 MED ORDER — MIDAZOLAM HCL 2 MG/2ML IJ SOLN
INTRAMUSCULAR | Status: AC
  Filled 2024-01-22: qty 2

## 2024-01-22 MED ORDER — ONDANSETRON HCL 4 MG/2ML IJ SOLN
INTRAMUSCULAR | Status: AC
Start: 2024-01-22 — End: 2024-01-22
  Filled 2024-01-22: qty 2

## 2024-01-22 MED ORDER — MAGNESIUM GLUCONATE 500 (27 MG) MG PO TABS
250.0000 mg | ORAL_TABLET | Freq: Every day | ORAL | Status: DC
  Administered 2024-01-22: 250 mg via ORAL
  Filled 2024-01-22: qty 1

## 2024-01-22 MED ORDER — ACETAMINOPHEN 325 MG PO TABS: 650.0000 mg | ORAL_TABLET | ORAL | Status: DC | PRN

## 2024-01-22 MED ORDER — BUPIVACAINE LIPOSOME 1.3 % IJ SUSP
INTRAMUSCULAR | Status: DC | PRN
  Administered 2024-01-22: 20 mL

## 2024-01-22 MED ORDER — MAGNESIUM GLYCINATE 100 MG PO CAPS: ORAL_CAPSULE | Freq: Every day | ORAL | Status: DC

## 2024-01-22 MED ORDER — PROPOFOL 10 MG/ML IV BOLUS
INTRAVENOUS | Status: AC
  Filled 2024-01-22: qty 20

## 2024-01-22 MED ORDER — DEXAMETHASONE SOD PHOSPHATE PF 10 MG/ML IJ SOLN
INTRAMUSCULAR | Status: DC | PRN
  Administered 2024-01-22: 10 mg via INTRAVENOUS

## 2024-01-22 MED ORDER — LACTATED RINGERS IV SOLN: INTRAVENOUS | Status: DC | PRN

## 2024-01-22 MED ORDER — MUPIROCIN 2 % EX OINT: 1.0000 | TOPICAL_OINTMENT | Freq: Two times a day (BID) | CUTANEOUS | 0 refills | Status: AC

## 2024-01-22 MED ORDER — CEFAZOLIN SODIUM-DEXTROSE 2-4 GM/100ML-% IV SOLN: 2.0000 g | Freq: Three times a day (TID) | INTRAVENOUS | Status: DC

## 2024-01-22 MED ORDER — SUGAMMADEX SODIUM 200 MG/2ML IV SOLN
INTRAVENOUS | Status: DC | PRN
  Administered 2024-01-22: 400 mg via INTRAVENOUS

## 2024-01-22 MED ORDER — HYDROMORPHONE HCL 1 MG/ML IJ SOLN
INTRAMUSCULAR | Status: DC | PRN
  Administered 2024-01-22: .5 mg via INTRAVENOUS

## 2024-01-22 MED ORDER — FENTANYL CITRATE (PF) 100 MCG/2ML IJ SOLN
INTRAMUSCULAR | Status: AC
  Filled 2024-01-22: qty 2

## 2024-01-22 MED ORDER — ONDANSETRON HCL 4 MG/2ML IJ SOLN: 4.0000 mg | Freq: Once | INTRAMUSCULAR | Status: DC | PRN

## 2024-01-22 MED ORDER — 0.9 % SODIUM CHLORIDE (POUR BTL) OPTIME
TOPICAL | Status: DC | PRN
  Administered 2024-01-22: 1000 mL

## 2024-01-22 MED ORDER — SODIUM CHLORIDE 0.9% FLUSH
3.0000 mL | Freq: Two times a day (BID) | INTRAVENOUS | Status: DC
  Administered 2024-01-22: 3 mL via INTRAVENOUS

## 2024-01-22 MED ORDER — ORAL CARE MOUTH RINSE: 15.0000 mL | Freq: Once | OROMUCOSAL | Status: AC

## 2024-01-22 MED ORDER — VITAMIN D 25 MCG (1000 UNIT) PO TABS
5000.0000 [IU] | ORAL_TABLET | Freq: Every day | ORAL | Status: DC
  Administered 2024-01-22: 5000 [IU] via ORAL
  Filled 2024-01-22 (×3): qty 5

## 2024-01-22 MED ORDER — PREDNISONE 20 MG PO TABS: 20.0000 mg | ORAL_TABLET | ORAL | Status: DC

## 2024-01-22 MED ORDER — ACETAMINOPHEN 10 MG/ML IV SOLN
1000.0000 mg | Freq: Once | INTRAVENOUS | Status: DC | PRN
  Administered 2024-01-22: 1000 mg via INTRAVENOUS

## 2024-01-22 MED ORDER — FENTANYL CITRATE (PF) 250 MCG/5ML IJ SOLN
INTRAMUSCULAR | Status: DC | PRN
  Administered 2024-01-22: 100 ug via INTRAVENOUS

## 2024-01-22 MED ORDER — EPHEDRINE SULFATE (PRESSORS) 25 MG/5ML IV SOSY
PREFILLED_SYRINGE | INTRAVENOUS | Status: DC | PRN
  Administered 2024-01-22 (×2): 10 mg via INTRAVENOUS
  Administered 2024-01-22: 5 mg via INTRAVENOUS

## 2024-01-22 MED ORDER — ONDANSETRON HCL 4 MG/2ML IJ SOLN
INTRAMUSCULAR | Status: DC | PRN
  Administered 2024-01-22: 4 mg via INTRAVENOUS

## 2024-01-22 MED ORDER — ROCURONIUM BROMIDE 10 MG/ML (PF) SYRINGE
PREFILLED_SYRINGE | INTRAVENOUS | Status: DC | PRN
  Administered 2024-01-22: 10 mg via INTRAVENOUS
  Administered 2024-01-22: 70 mg via INTRAVENOUS
  Administered 2024-01-22: 10 mg via INTRAVENOUS
  Administered 2024-01-22: 20 mg via INTRAVENOUS

## 2024-01-22 MED ORDER — PHENYLEPHRINE 80 MCG/ML (10ML) SYRINGE FOR IV PUSH (FOR BLOOD PRESSURE SUPPORT)
PREFILLED_SYRINGE | INTRAVENOUS | Status: AC
Start: 2024-01-22 — End: 2024-01-22
  Filled 2024-01-22: qty 10

## 2024-01-22 MED ORDER — HYDROMORPHONE HCL 1 MG/ML IJ SOLN
INTRAMUSCULAR | Status: AC
  Filled 2024-01-22: qty 0.5

## 2024-01-22 MED ORDER — THYROID 30 MG PO TABS
30.0000 mg | ORAL_TABLET | Freq: Every day | ORAL | Status: DC
  Administered 2024-01-23: 30 mg via ORAL
  Filled 2024-01-22: qty 1

## 2024-01-22 MED ORDER — CHLORHEXIDINE GLUCONATE 4 % EX SOLN: 1.0000 | CUTANEOUS | 1 refills | Status: AC

## 2024-01-22 MED ORDER — EPHEDRINE 5 MG/ML INJ
INTRAVENOUS | Status: AC
Start: 2024-01-22 — End: 2024-01-22
  Filled 2024-01-22: qty 5

## 2024-01-22 MED ORDER — OXYCODONE HCL 5 MG PO TABS: 5.0000 mg | ORAL_TABLET | Freq: Once | ORAL | Status: DC | PRN

## 2024-01-22 MED ORDER — ESTRADIOL-LEVONORGESTREL 0.045-0.015 MG/DAY TD PTWK: 1.0000 | MEDICATED_PATCH | TRANSDERMAL | Status: DC

## 2024-01-22 MED ORDER — VORTIOXETINE HBR 20 MG PO TABS
20.0000 mg | ORAL_TABLET | Freq: Every day | ORAL | Status: DC
  Filled 2024-01-22: qty 1

## 2024-01-22 MED ORDER — LACTATED RINGERS IV SOLN: INTRAVENOUS | Status: DC

## 2024-01-22 MED ORDER — LURASIDONE HCL 20 MG PO TABS
30.0000 mg | ORAL_TABLET | Freq: Two times a day (BID) | ORAL | Status: DC
  Administered 2024-01-22: 30 mg via ORAL
  Filled 2024-01-22 (×2): qty 2

## 2024-01-22 MED ORDER — FENTANYL CITRATE (PF) 100 MCG/2ML IJ SOLN
25.0000 ug | INTRAMUSCULAR | Status: DC | PRN
  Administered 2024-01-22 (×3): 50 ug via INTRAVENOUS

## 2024-01-22 MED ORDER — VANCOMYCIN HCL IN DEXTROSE 1-5 GM/200ML-% IV SOLN
INTRAVENOUS | Status: AC
  Administered 2024-01-22: 1000 mg via INTRAVENOUS
  Filled 2024-01-22: qty 200

## 2024-01-22 MED ORDER — VANCOMYCIN HCL IN DEXTROSE 1-5 GM/200ML-% IV SOLN: 1000.0000 mg | Freq: Once | INTRAVENOUS | Status: AC

## 2024-01-22 MED ORDER — BUPIVACAINE LIPOSOME 1.3 % IJ SUSP
INTRAMUSCULAR | Status: AC
  Filled 2024-01-22: qty 20

## 2024-01-22 MED ORDER — HYDROCHLOROTHIAZIDE 12.5 MG PO TABS: 12.5000 mg | ORAL_TABLET | Freq: Every day | ORAL | Status: DC

## 2024-01-22 MED ORDER — LIDOCAINE-EPINEPHRINE 1 %-1:100000 IJ SOLN
INTRAMUSCULAR | Status: AC
  Filled 2024-01-22: qty 1

## 2024-01-22 MED ORDER — SODIUM CHLORIDE 0.9% FLUSH: 3.0000 mL | INTRAVENOUS | Status: DC | PRN

## 2024-01-22 MED ORDER — PHENOL 1.4 % MT LIQD
1.0000 | OROMUCOSAL | Status: DC | PRN
Start: 2024-01-22 — End: 2024-01-23

## 2024-01-22 MED ORDER — MUPIROCIN 2 % EX OINT
1.0000 | TOPICAL_OINTMENT | Freq: Two times a day (BID) | CUTANEOUS | Status: DC
  Administered 2024-01-22 – 2024-01-23 (×2): 1 via NASAL
  Filled 2024-01-22: qty 22

## 2024-01-22 SURGICAL SUPPLY — 64 items
BAG COUNTER SPONGE SURGICOUNT (BAG) ×1 IMPLANT
BASKET BONE COLLECTION (BASKET) ×1 IMPLANT
BENZOIN TINCTURE PRP APPL 2/3 (GAUZE/BANDAGES/DRESSINGS) ×1 IMPLANT
BLADE BONE MILL MEDIUM (MISCELLANEOUS) ×1 IMPLANT
BLADE CLIPPER SURG (BLADE) IMPLANT
BLADE SURG 11 STRL SS (BLADE) ×1 IMPLANT
BUR CUTTER 7.0 ROUND (BURR) ×1 IMPLANT
BUR MATCHSTICK NEURO 3.0 LAGG (BURR) ×1 IMPLANT
CANISTER SUCTION 3000ML PPV (SUCTIONS) ×1 IMPLANT
CNTNR URN SCR LID CUP LEK RST (MISCELLANEOUS) ×1 IMPLANT
COVER BACK TABLE 60X90IN (DRAPES) ×1 IMPLANT
DERMABOND ADVANCED .7 DNX12 (GAUZE/BANDAGES/DRESSINGS) ×1 IMPLANT
DRAPE C-ARM 42X72 X-RAY (DRAPES) ×1 IMPLANT
DRAPE C-ARMOR (DRAPES) IMPLANT
DRAPE HALF SHEET 40X57 (DRAPES) IMPLANT
DRAPE LAPAROTOMY 100X72X124 (DRAPES) ×1 IMPLANT
DRAPE SURG 17X23 STRL (DRAPES) ×1 IMPLANT
DRSG OPSITE 4X5.5 SM (GAUZE/BANDAGES/DRESSINGS) ×1 IMPLANT
DRSG OPSITE POSTOP 4X6 (GAUZE/BANDAGES/DRESSINGS) ×1 IMPLANT
DURAPREP 26ML APPLICATOR (WOUND CARE) ×1 IMPLANT
ELECTRODE REM PT RTRN 9FT ADLT (ELECTROSURGICAL) ×1 IMPLANT
EVACUATOR 1/8 PVC DRAIN (DRAIN) ×1 IMPLANT
GAUZE 4X4 16PLY ~~LOC~~+RFID DBL (SPONGE) IMPLANT
GAUZE SPONGE 4X4 12PLY STRL (GAUZE/BANDAGES/DRESSINGS) ×1 IMPLANT
GLOVE BIO SURGEON STRL SZ7 (GLOVE) IMPLANT
GLOVE BIO SURGEON STRL SZ8 (GLOVE) ×2 IMPLANT
GLOVE BIOGEL PI IND STRL 7.0 (GLOVE) IMPLANT
GLOVE BIOGEL PI IND STRL 7.5 (GLOVE) IMPLANT
GLOVE INDICATOR 8.5 STRL (GLOVE) ×2 IMPLANT
GLOVE SURG SS PI 6.5 STRL IVOR (GLOVE) IMPLANT
GLOVE SURG SS PI 7.0 STRL IVOR (GLOVE) IMPLANT
GLOVE SURG SYN 8.0 PF PI (GLOVE) IMPLANT
GOWN STRL REUS W/ TWL LRG LVL3 (GOWN DISPOSABLE) IMPLANT
GOWN STRL REUS W/ TWL XL LVL3 (GOWN DISPOSABLE) ×2 IMPLANT
GOWN STRL REUS W/TWL 2XL LVL3 (GOWN DISPOSABLE) IMPLANT
GRAFT BNE MATRIX VG FRMBL MD 5 (Bone Implant) IMPLANT
KIT BASIN OR (CUSTOM PROCEDURE TRAY) ×1 IMPLANT
KIT TURNOVER KIT B (KITS) ×1 IMPLANT
MILL BONE PREP (MISCELLANEOUS) ×1 IMPLANT
NDL HYPO 21X1.5 SAFETY (NEEDLE) ×1 IMPLANT
NDL HYPO 25X1 1.5 SAFETY (NEEDLE) ×1 IMPLANT
NEEDLE HYPO 21X1.5 SAFETY (NEEDLE) ×1 IMPLANT
NEEDLE HYPO 25X1 1.5 SAFETY (NEEDLE) ×1 IMPLANT
PACK LAMINECTOMY NEURO (CUSTOM PROCEDURE TRAY) ×1 IMPLANT
PAD ARMBOARD POSITIONER FOAM (MISCELLANEOUS) ×3 IMPLANT
ROD LORD LIPPED TI 5.5X70 (Rod) IMPLANT
SCREW CANC SHANK MOD 5.5X35 (Screw) IMPLANT
SCREW POLYAXIAL TULIP (Screw) IMPLANT
SET SCREW SPNE (Screw) IMPLANT
SOLN 0.9% NACL POUR BTL 1000ML (IV SOLUTION) ×1 IMPLANT
SOLN STERILE WATER BTL 1000 ML (IV SOLUTION) ×1 IMPLANT
SPACER IDENTITI 9X8X25 10D (Spacer) IMPLANT
SPACER IDENTITI PS 9X9X25 10D (Spacer) IMPLANT
SPIKE FLUID TRANSFER (MISCELLANEOUS) ×1 IMPLANT
SPONGE SURGIFOAM ABS GEL 100 (HEMOSTASIS) ×1 IMPLANT
SPONGE T-LAP 4X18 ~~LOC~~+RFID (SPONGE) IMPLANT
STRIP CLOSURE SKIN 1/2X4 (GAUZE/BANDAGES/DRESSINGS) ×2 IMPLANT
SUT VIC AB 0 CT1 18XCR BRD8 (SUTURE) ×1 IMPLANT
SUT VIC AB 2-0 CT1 18 (SUTURE) ×1 IMPLANT
SUT VIC AB 4-0 PS2 27 (SUTURE) ×1 IMPLANT
SYR 20ML LL LF (SYRINGE) ×1 IMPLANT
TOWEL GREEN STERILE (TOWEL DISPOSABLE) ×1 IMPLANT
TOWEL GREEN STERILE FF (TOWEL DISPOSABLE) ×1 IMPLANT
TRAY FOLEY MTR SLVR 16FR STAT (SET/KITS/TRAYS/PACK) ×1 IMPLANT

## 2024-01-22 NOTE — Op Note (Signed)
 Preoperative diagnosis: Lumbar spinal stenosis marked degenerative disc disease instability and synovial cyst L3-4 L4-5.  Postoperative diagnosis: Same.  Procedure #1 redo decompressive lumbar laminectomies L3-4 L4-5 with redo foraminotomies of the L3-L4 and L5 nerve roots with complete facetectomies and removal of the pars at L3 and L4 and aggressive under biting of super reticulating facets..  In excess and requiring more work than would be needed with a standard interbody fusion  2.  Posterior lumbar interbody fusion L3-4 L4-5 utilizing Alphatec titanium cages packed with locally harvested autograft mixed with Vivigen.  3.  Cortical screw fixation L3-4 L4-5 utilizing the Alphatec modular cortical screw set.  Surgeon: Arley helling.  Assistant: Dorn Glade.  Assistant to: Suzen Click.  Anesthesia: General.  EBL: 104  HPI: 58 year old female previously undergone decompressive laminectomies at L3-4 and L4-5 and did well initially however last several months and years had progressive back pain bilateral hip and leg pain workup revealed synovial cyst coming off the degenerative disc facet joints marked degenerative disc disease lumbar instability and a grade 1 spondylolisthesis at L4-5 with severe spinal stenosis at L3-4.  Due to the patient's progression of clinical syndrome imaging findings of failed conservative treatment I recommended redo decompressive laminectomies and interbody fusions at those 2 levels.  I extensively reviewed the risks and benefits of the operation with the patient as well as perioperative course expectations of outcome and alternatives to surgery and she understood and agreed to proceed forward.  Operative procedure: Patient was brought into the OR was induced under general anesthesia positioned prone the Wilson frame her back was prepped and draped in routine sterile fashion.  Roll incision was identified and opened up after infiltration of 10 cc lidocaine  with epi  and extended slightly cephalad caudally.  Subperiosteal dissection was carried lamina residual of L3 and the cortical screw entry point at L3 then the scar tissue was dissected off of the residua of the facet joints at L3-4 and L4-5 completed the decompressive laminectomy at L3 and then marching through the scar tissue laterally I performed an L3 foraminotomy came underneath the super articulating facet decompressed and expose the lateral aspect the disc base and a large dark along the pedicle of L4 with completing the facetectomies at L3-4 and L4-5 with redo foraminotomies of the L3-L4 and L5 nerve roots bilaterally.  Marked hourglass compression with a lot of extensive epidural adhesions took an extensive mount of time freeing up the facet joints and decompressing the thecal sac and the foramina.  But after complete decompression was achieved and radical foraminotomies were achieved both superior tickler and facets were under Bitton to expose the lateral aspect of the space.  First working at L4-5 to space was incised sequentially distracted large disc fragments were removed from the disc base further decompressing the foramina endplates were then prepared cages were inserted with an extensive amount of autograft material centrally at L4-5 and attention taken L3-4 in a similar fashion endplates were paired discectomy was performed cages were inserted with an extensive mount of autograft centrally I then placed cortical screws under fluoroscopy as well all screws had excellent purchase postop imaging confirmed good position of all the implants assembled the heads assembled the rods tightened everything in place and then reinspected the foramina to confirm patency no migration of graft material.  Gelfoam was over in top of the dura need medium Hemovac drain was placed Exparel  was injected in the fascia and the wound was closed in layers with after Vicryl and  a running 4 subcuticular.  Dermabond benzoin Steri-Strips  and a sterile dressing was applied patient went to cover him in stable condition.  At the end the case all needle count sponge counts were correct.

## 2024-01-22 NOTE — H&P (Signed)
 Abigail Gomez is an 58 y.o. female.   Chief Complaint: Back and right leg pain HPI: 58 year old female with longstanding back and bilateral leg pain worse on the right previously undergone decompressive laminectomies at L3-4 and L4-5.  Workup currently reveals progressive degeneration spinal stenosis and severe foraminal stenosis at L3-4 and L4-5.  Due to patient's progression of clinical syndrome imaging findings failed conservative treatment I recommended redo decompressive laminectomies at L3-4 and L4-5 as well as posterior lumbar interbody fusions at those 2 levels.  We extensively reviewed the risks and benefits of the operation with the patient as well as perioperative course expectations of outcome and alternatives to surgery and she understood and agreed to proceed forward.  Past Medical History:  Diagnosis Date   Allergy    Anemia    Anxiety    Depression    Disease    lymes disease   Fatty liver    non fatty liver disease per patient   Fibromyalgia    GERD (gastroesophageal reflux disease)    H/o Lyme disease    H/O sickle cell trait    History of pelvic fracture    History of urinary incontinence    Hypertension    Hypothyroidism    Insomnia    Labral tear of hip joint    Right Hip   Migraine    Osteoarthritis    Osteoporosis    RLS (restless legs syndrome)    Sleep apnea    cpap   Spinal stenosis    Thyroid  disease    TMJ syndrome    Vitamin D  deficiency     Past Surgical History:  Procedure Laterality Date   ABDOMINAL HYSTERECTOMY     BRAIN SURGERY  2007   Chiarimalformation   BUNIONECTOMY Bilateral    chiari malformation     ENDOMETRIAL ABLATION     JOINT REPLACEMENT  2017,2018, 2021, 20222   MYOMECTOMY     OOPHORECTOMY     TONSILLECTOMY AND ADENOIDECTOMY     TOTAL HIP ARTHROPLASTY Right 02/28/2016   TOTAL HIP ARTHROPLASTY Right 02/28/2016   Procedure: RIGHT TOTAL HIP ARTHROPLASTY ANTERIOR APPROACH;  Surgeon: Lonni CINDERELLA Poli, MD;  Location: [facility]  OR;  Service: Orthopedics;  Laterality: Right;    Family History  Problem Relation Age of Onset   Hyperlipidemia Mother    Diabetes Mother    Hearing loss Mother    Vision loss Mother    Diabetes Father    Hypertension Father    Cancer Father        prostate   Alcohol abuse Father    Vision loss Father    Diabetes Sister    Hypertension Sister    ADD / ADHD Sister    Obesity Sister    Arthritis Maternal Grandmother    Stroke Maternal Grandmother    Hypertension Maternal Grandmother    Hyperlipidemia Maternal Grandmother    Hyperkalemia Maternal Grandmother    Heart disease Maternal Grandmother    Varicose Veins Maternal Grandmother    Arthritis Paternal Grandmother    Diabetes Sister    Hearing loss Sister    Social History:  reports that she has never smoked. She has never used smokeless tobacco. She reports current alcohol use. She reports that she does not use drugs.  Allergies:  Allergies  Allergen Reactions   Peanut Oil Itching   Penicillins Anaphylaxis and Other (See Comments)    Syncope   Enulose [Lactulose] Diarrhea, Nausea Only and Other (See Comments)  GI intolerance    Food Itching and Swelling    Tree nuts-itching, swelling    Lactose Intolerance (Gi) Diarrhea and Other (See Comments)    GI intolerance    Lyrica [Pregabalin] Other (See Comments)    Dizziness Confusion    Zestril [Lisinopril] Cough   Banana Itching, Swelling and Rash   Mustard Oil [Allyl Isothiocyanate] Hives and Rash    Medications Prior to Admission  Medication Sig Dispense Refill   ALPRAZolam (XANAX) 0.5 MG tablet Take 0.5 mg by mouth 2 (two) times daily as needed for anxiety.     amitriptyline  (ELAVIL ) 50 MG tablet Take 1 tablet (50 mg total) by mouth at bedtime. 90 tablet 3   Cholecalciferol  (VITAMIN D -3 PO) Take 1 capsule by mouth daily.     conjugated estrogens (PREMARIN) vaginal cream Place 0.5 g vaginally See admin instructions. Apply a small amount vaginally 1-2 nights  a week.     cyclobenzaprine  (FLEXERIL ) 10 MG tablet Take 10 mg by mouth 3 (three) times daily as needed for muscle spasms.     estradiol -levonorgestrel (CLIMARA  PRO) 0.045-0.015 MG/DAY Place 1 patch onto the skin once a week.     losartan -hydrochlorothiazide  (HYZAAR) 100-25 MG tablet TAKE 1/2 TABLET BY MOUTH DAILY 45 tablet 1   Lurasidone HCl (LATUDA) 60 MG TABS Take 30 mg by mouth 2 (two) times daily.     MAGNESIUM  GLYCINATE PO Take 2 tablets by mouth at bedtime.     metoprolol  tartrate (LOPRESSOR ) 50 MG tablet TAKE 1 TABLET BY MOUTH TWICE A DAY 180 tablet 3   nitrofurantoin, macrocrystal-monohydrate, (MACROBID) 100 MG capsule Take 100 mg by mouth daily as needed (intercourse).     NONFORMULARY OR COMPOUNDED ITEM Apply 1 application  topically See admin instructions. Compounded testosterone  5% cream; apply topically 2-3 days a week.     OVER THE COUNTER MEDICATION Take 1 capsule by mouth daily. OTC seamoss gel capsules     OVER THE COUNTER MEDICATION Take 1 capsule by mouth continuous dialysis. OTC black seed oil gel capsule     predniSONE  (DELTASONE ) 20 MG tablet Take 20-60 mg by mouth See admin instructions. Take 3 tablets (60mg ) by mouth daily for 3 days, then take 2 tablets (40mg ) daily for 2 days, then take 1 tablet (20mg ) daily for 2 days.     progesterone  (PROMETRIUM ) 100 MG capsule Take 100 mg by mouth daily.     rizatriptan  (MAXALT -MLT) 10 MG disintegrating tablet Take 1 tablet (10 mg total) by mouth as needed for migraine. May repeat in 2 hours if needed 10 tablet 4   senna-docusate (SENOKOT-S) 8.6-50 MG tablet Take 1 tablet by mouth daily. (Patient taking differently: Take 1 tablet by mouth daily as needed (constipation).) 30 tablet 3   thyroid  (ARMOUR THYROID ) 30 MG tablet Take 1 tablet (30 mg total) by mouth daily. 90 tablet 1   tiZANidine  (ZANAFLEX ) 4 MG tablet Take 1 tablet (4 mg total) by mouth every 8 (eight) hours as needed for muscle spasms. 15 tablet 0   vortioxetine  HBr  (TRINTELLIX ) 20 MG TABS tablet Take 20 mg by mouth daily.      zolpidem (AMBIEN CR) 12.5 MG CR tablet Take 12.5 mg by mouth at bedtime.     HYDROcodone -acetaminophen  (NORCO/VICODIN) 5-325 MG tablet Take 1 tablet by mouth every 6 (six) hours as needed (pain).     lidocaine  (LIDODERM ) 5 % PLACE 1 PATCH ONTO THE SKIN DAILY. REMOVE & DISCARD PATCH WITHIN 12 HOURS OR AS DIRECTED BY  MD (Patient taking differently: Place 1 patch onto the skin daily as needed (pain).) 90 patch 3   meloxicam (MOBIC) 15 MG tablet Take 15 mg by mouth daily as needed for pain.     ondansetron  (ZOFRAN -ODT) 4 MG disintegrating tablet Take 4 mg by mouth every 8 (eight) hours as needed for nausea or vomiting.      Results for orders placed or performed during the hospital encounter of 01/22/24 (from the past 48 hours)  Type and screen [facility]     Status: None (Preliminary result)   Collection Time: 01/22/24  7:00 AM  Result Value Ref Range   ABO/RH(D) PENDING    Antibody Screen PENDING    Sample Expiration      01/25/2024,2359 Performed at [facility], [address], [place], [place] [address]    No results found.  Review of Systems  Musculoskeletal:  Positive for back pain.  Neurological:  Positive for numbness.    Blood pressure 137/82, pulse (!) 52, temperature 98.3 F (36.8 C), resp. rate 18, height 5' 8 (1.727 m), weight 67.1 kg, SpO2 99%. Physical Exam HENT:     Head: Normocephalic.     Right Ear: Tympanic membrane normal.     Nose: Nose normal.  Cardiovascular:     Rate and Rhythm: Normal rate.     Pulses: Normal pulses.  Pulmonary:     Effort: Pulmonary effort is normal.  Musculoskeletal:        General: Normal range of motion.     Cervical back: Normal range of motion.  Skin:    General: Skin is warm.  Neurological:     Mental Status: She is alert.     Comments: Strength is 5 of 5 iliopsoas, quads, hamstrings, gastrocs, ant tibialis, EHL.       Assessment/Plan 58 year old presents for redo decompressive laminectomies and interbody fusions L3-4 L4-5  Arley SHAUNNA Helling, MD 01/22/2024, 8:04 AM

## 2024-01-22 NOTE — Anesthesia Procedure Notes (Signed)
 Procedure Name: Intubation Date/Time: 01/22/2024 8:35 AM  Performed by: Keneth Lynwood POUR, MDPre-anesthesia Checklist: Patient identified, Emergency Drugs available, Suction available and Patient being monitored Patient Re-evaluated:Patient Re-evaluated prior to induction Oxygen Delivery Method: Circle system utilized Preoxygenation: Pre-oxygenation with 100% oxygen Induction Type: IV induction Ventilation: Mask ventilation without difficulty Laryngoscope Size: Mac and 3 Grade View: Grade II Tube type: Oral Tube size: 7.0 mm Number of attempts: 1 Airway Equipment and Method: Stylet and Oral airway Placement Confirmation: ETT inserted through vocal cords under direct vision, positive ETCO2 and breath sounds checked- equal and bilateral Secured at: 22 cm Tube secured with: Tape Dental Injury: Teeth and Oropharynx as per pre-operative assessment

## 2024-01-22 NOTE — Transfer of Care (Signed)
 Immediate Anesthesia Transfer of Care Note  Patient: Abigail Gomez  Procedure(s) Performed: POSTERIOR LUMBAR INTERBODY FUSION LUMBAR THREE-FOUR/LUMBAR FOUR-FIVE POSTERIOR LATERAL AND INTERBODY FUSION (Back)  Patient Location: PACU  Anesthesia Type:General  Level of Consciousness: drowsy  Airway & Oxygen Therapy: Patient Spontanous Breathing and Patient connected to face mask oxygen  Post-op Assessment: Report given to RN and Patient moving all extremities  Post vital signs: Reviewed and stable  Last Vitals:  Vitals Value Taken Time  BP 143/80 01/22/24 13:55  Temp    Pulse 79 01/22/24 13:58  Resp 14 01/22/24 13:58  SpO2 100 % 01/22/24 13:58  Vitals shown include unfiled device data.  Last Pain:  Vitals:   01/22/24 0719  PainSc: 7       Patients Stated Pain Goal: 3 (01/22/24 0719)  Complications: No notable events documented.

## 2024-01-22 NOTE — Anesthesia Preprocedure Evaluation (Signed)
 Anesthesia Evaluation  Patient identified by MRN, date of birth, ID band Patient awake    Reviewed: Allergy & Precautions, NPO status , Patient's Chart, lab work & pertinent test results, reviewed documented beta blocker date and time   History of Anesthesia Complications Negative for: history of anesthetic complications  Airway Mallampati: II  TM Distance: >3 FB Neck ROM: Full    Dental no notable dental hx.    Pulmonary sleep apnea , neg COPD, Current SmokerPatient did not abstain from smoking.   breath sounds clear to auscultation       Cardiovascular hypertension, (-) CAD, (-) Past MI, (-) Cardiac Stents and (-) CABG  Rhythm:Regular Rate:Normal     Neuro/Psych  Headaches, neg Seizures PSYCHIATRIC DISORDERS Anxiety Depression     Neuromuscular disease    GI/Hepatic PUD,GERD  ,,(+) neg Cirrhosis        Endo/Other  Hypothyroidism    Renal/GU Renal disease     Musculoskeletal  (+) Arthritis ,  Fibromyalgia -  Abdominal   Peds  Hematology  (+) Blood dyscrasia, anemia   Anesthesia Other Findings   Reproductive/Obstetrics                              Anesthesia Physical Anesthesia Plan  ASA: 2  Anesthesia Plan: General   Post-op Pain Management:    Induction: Intravenous  PONV Risk Score and Plan: 2 and Ondansetron  and Dexamethasone   Airway Management Planned: Oral ETT  Additional Equipment:   Intra-op Plan:   Post-operative Plan: Extubation in OR  Informed Consent: I have reviewed the patients History and Physical, chart, labs and discussed the procedure including the risks, benefits and alternatives for the proposed anesthesia with the patient or authorized representative who has indicated his/her understanding and acceptance.     Dental advisory given  Plan Discussed with: CRNA  Anesthesia Plan Comments:          Anesthesia Quick Evaluation

## 2024-01-22 NOTE — Progress Notes (Signed)
 Patient ID: Abigail Gomez, female   DOB: [birth year], 58 y.o.   MRN: [id number] Patient doing well with significant improvement inpreoperative radicular pain but awoke with numbnes in first 3 fingers of left hand. No neck pain or arm symptoms, consistant with CTS.  Probably from positioning

## 2024-01-22 NOTE — Progress Notes (Addendum)
 Pharmacy Antibiotic Note  Abigail Gomez is a 58 y.o. female admitted on 01/22/2024 s/p lumbar fusion. Pt received dose of vancomycin 1g pre-op 0737. Pharmacy has been consulted for vancomycin dosing.  Plan: Vancomycin 1250mg  q24h (eAUC 454, Scr used 1)  F/u renal function, infectious work up and length of therapy Vancomycin levels as needed   Height: 5' 8 (172.7 cm) Weight: 67.1 kg (148 lb) IBW/kg (Calculated) : 63.9  Temp (24hrs), Avg:97.9 F (36.6 C), Min:97.5 F (36.4 C), Max:98.3 F (36.8 C)  No results for input(s): WBC, CREATININE, LATICACIDVEN, VANCOTROUGH, VANCOPEAK, VANCORANDOM, GENTTROUGH, GENTPEAK, GENTRANDOM, TOBRATROUGH, TOBRAPEAK, TOBRARND, AMIKACINPEAK, AMIKACINTROU, AMIKACIN in the last 168 hours.  CrCl cannot be calculated (Patient's most recent lab result is older than the maximum 21 days allowed.).    Allergies  Allergen Reactions   Peanut Oil Itching   Penicillins Anaphylaxis and Other (See Comments)    Syncope   Enulose [Lactulose] Diarrhea, Nausea Only and Other (See Comments)    GI intolerance    Food Itching and Swelling    Tree nuts-itching, swelling    Lactose Intolerance (Gi) Diarrhea and Other (See Comments)    GI intolerance    Lyrica [Pregabalin] Other (See Comments)    Dizziness Confusion    Zestril [Lisinopril] Cough   Banana Itching, Swelling and Rash   Mustard Oil [Allyl Isothiocyanate] Hives and Rash    Thank you for allowing pharmacy to be a part of this patient's care.  Leonor GORMAN Bash 01/22/2024 4:27 PM

## 2024-01-23 ENCOUNTER — Other Ambulatory Visit (HOSPITAL_COMMUNITY): Payer: Self-pay

## 2024-01-23 MED ORDER — METHYLPREDNISOLONE 4 MG PO TBPK
ORAL_TABLET | ORAL | 0 refills | Status: AC
  Filled 2024-01-23: qty 21, 6d supply, fill #0

## 2024-01-23 MED ORDER — CYCLOBENZAPRINE HCL 10 MG PO TABS
10.0000 mg | ORAL_TABLET | Freq: Three times a day (TID) | ORAL | 0 refills | Status: AC | PRN
  Filled 2024-01-23: qty 30, 10d supply, fill #0

## 2024-01-23 MED ORDER — OXYCODONE HCL 15 MG PO TABS
15.0000 mg | ORAL_TABLET | ORAL | 0 refills | Status: AC | PRN
  Filled 2024-01-23: qty 24, 6d supply, fill #0

## 2024-01-23 NOTE — Discharge Summary (Signed)
 Physician Discharge Summary  Patient ID: Abigail Gomez MRN: [id number] DOB/AGE: 1965-06-07 58 y.o.  Admit date: 01/22/2024 Discharge date: 01/23/2024  Admission Diagnoses: Lumbar spinal stenosis marked degenerative disc disease instability and synovial cyst L3-4 L4-5.     Discharge Diagnoses: same   Discharged Condition: good  Hospital Course: The patient was admitted on 01/22/2024 and taken to the operating room where the patient underwent PLIF L3-4, L4-5. The patient tolerated the procedure well and was taken to the recovery room and then to the floor in stable condition. The hospital course was routine. There were no complications. The wound remained clean dry and intact. Pt had appropriate back soreness. No complaints of leg pain or new N/T/W. The patient remained afebrile with stable vital signs, and tolerated a regular diet. The patient continued to increase activities, and pain was well controlled with oral pain medications.   Consults: None  Significant Diagnostic Studies:  Results for orders placed or performed during the hospital encounter of 01/22/24  Type and screen [facility]   Collection Time: 01/22/24  7:00 AM  Result Value Ref Range   ABO/RH(D) B POS    Antibody Screen NEG    Sample Expiration      01/25/2024,2359 Performed at [facility], [address], [place], [place] [address]     DG Lumbar Spine 2-3 Views Result Date: 01/22/2024 EXAM: 3 VIEW(S) XRAY OF THE LUMBAR SPINE 01/22/2024 01:20:48 PM COMPARISON: None available. CLINICAL HISTORY: [id number] Surgery, elective [id number] Surgery, elective [id number] FINDINGS: LUMBAR SPINE: BONES: Radiation exposure index 29.36 mGy. 3 intraoperative fluoroscopic images were obtained of the lower lumbar spine. These demonstrate interpedicular screw placement bilaterally at L3, L4 and L5. Interbody fusion of L3-L4 and L4-L5 is noted as well. Alignment is normal. DISCS AND DEGENERATIVE CHANGES: Interbody fusion  of L3-L4 and L4-L5 is noted. SOFT TISSUES: No acute abnormality. IMPRESSION: 1. Bilateral pedicle screw placement at L3, L4, and L5. 2. Interbody fusion at L3-4 and L4-5. Electronically signed by: Lynwood Seip MD 01/22/2024 03:12 PM EST RP Workstation: [id number]   DG C-Arm 1-60 Min-No Report Result Date: 01/22/2024 Fluoroscopy was utilized by the requesting physician.  No radiographic interpretation.   DG C-Arm 1-60 Min-No Report Result Date: 01/22/2024 Fluoroscopy was utilized by the requesting physician.  No radiographic interpretation.   DG C-Arm 1-60 Min-No Report Result Date: 01/22/2024 Fluoroscopy was utilized by the requesting physician.  No radiographic interpretation.   DG C-Arm 1-60 Min-No Report Result Date: 01/22/2024 Fluoroscopy was utilized by the requesting physician.  No radiographic interpretation.    Antibiotics:  Anti-infectives (From admission, onward)    Start     Dose/Rate Route Frequency Ordered Stop   01/23/24 0800  vancomycin (VANCOREADY) IVPB 1250 mg/250 mL        1,250 mg 166.7 mL/hr over 90 Minutes Intravenous Every 24 hours 01/22/24 1702     01/22/24 1615  ceFAZolin  (ANCEF ) IVPB 2g/100 mL premix  Status:  Discontinued        2 g 200 mL/hr over 30 Minutes Intravenous Every 8 hours 01/22/24 1600 01/22/24 1602   01/22/24 1600  nitrofurantoin (macrocrystal-monohydrate) (MACROBID) capsule 100 mg  Status:  Discontinued        100 mg Oral Daily PRN 01/22/24 1600 01/22/24 1614   01/22/24 0815  clindamycin  (CLEOCIN ) IVPB 900 mg        900 mg 100 mL/hr over 30 Minutes Intravenous  Once 01/22/24 0805 01/22/24 0855   01/22/24 0730  vancomycin (VANCOCIN)  IVPB 1000 mg/200 mL premix        1,000 mg 200 mL/hr over 60 Minutes Intravenous  Once 01/22/24 0730 01/22/24 0837       Discharge Exam: Blood pressure (!) 145/76, pulse 68, temperature 98 F (36.7 C), temperature source Oral, resp. rate 18, height 5' 8 (1.727 m), weight 67.1 kg, SpO2 100%. Neurologic:  Grossly normal Ambulating and voiding well incision cdi   Discharge Medications:   Allergies as of 01/23/2024       Reactions   Peanut Oil Itching   Penicillins Anaphylaxis, Other (See Comments)   Syncope   Enulose [lactulose] Diarrhea, Nausea Only, Other (See Comments)   GI intolerance    Food Itching, Swelling   Tree nuts-itching, swelling   Lactose Intolerance (gi) Diarrhea, Other (See Comments)   GI intolerance    Lyrica [pregabalin] Other (See Comments)   Dizziness Confusion    Zestril [lisinopril] Cough   Banana Itching, Swelling, Rash   Mustard Oil [allyl Isothiocyanate] Hives, Rash        Medication List     STOP taking these medications    HYDROcodone -acetaminophen  5-325 MG tablet Commonly known as: NORCO/VICODIN   tiZANidine  4 MG tablet Commonly known as: Zanaflex        TAKE these medications    ALPRAZolam 0.5 MG tablet Commonly known as: XANAX Take 0.5 mg by mouth 2 (two) times daily as needed for anxiety.   amitriptyline  50 MG tablet Commonly known as: ELAVIL  Take 1 tablet (50 mg total) by mouth at bedtime.   chlorhexidine  4 % external liquid Commonly known as: HIBICLENS  Apply 15 mLs (1 Application total) topically as directed for 30 doses. Use as directed daily for 5 days every other week for 6 weeks.   Climara  Pro 0.045-0.015 MG/DAY Generic drug: estradiol -levonorgestrel Place 1 patch onto the skin once a week.   conjugated estrogens vaginal cream Commonly known as: PREMARIN Place 0.5 g vaginally See admin instructions. Apply a small amount vaginally 1-2 nights a week.   cyclobenzaprine  10 MG tablet Commonly known as: FLEXERIL  Take 1 tablet (10 mg total) by mouth 3 (three) times daily as needed for muscle spasms.   Latuda 60 MG Tabs Generic drug: Lurasidone HCl Take 30 mg by mouth 2 (two) times daily.   lidocaine  5 % Commonly known as: LIDODERM  PLACE 1 PATCH ONTO THE SKIN DAILY. REMOVE & DISCARD PATCH WITHIN 12 HOURS OR AS DIRECTED  BY MD What changed:  when to take this reasons to take this additional instructions   losartan -hydrochlorothiazide  100-25 MG tablet Commonly known as: HYZAAR TAKE 1/2 TABLET BY MOUTH DAILY   MAGNESIUM  GLYCINATE PO Take 2 tablets by mouth at bedtime.   meloxicam 15 MG tablet Commonly known as: MOBIC Take 15 mg by mouth daily as needed for pain.   methylPREDNISolone  4 MG Tbpk tablet Commonly known as: MEDROL  DOSEPAK Take as directed   metoprolol  tartrate 50 MG tablet Commonly known as: LOPRESSOR  TAKE 1 TABLET BY MOUTH TWICE A DAY   mupirocin ointment 2 % Commonly known as: BACTROBAN Place 1 Application into the nose 2 (two) times daily for 60 doses. Use as directed 2 times daily for 5 days every other week for 6 weeks.   nitrofurantoin (macrocrystal-monohydrate) 100 MG capsule Commonly known as: MACROBID Take 100 mg by mouth daily as needed (intercourse).   NONFORMULARY OR COMPOUNDED ITEM Apply 1 application  topically See admin instructions. Compounded testosterone  5% cream; apply topically 2-3 days a week.   ondansetron  4  MG disintegrating tablet Commonly known as: ZOFRAN -ODT Take 4 mg by mouth every 8 (eight) hours as needed for nausea or vomiting.   OVER THE COUNTER MEDICATION Take 1 capsule by mouth daily. OTC seamoss gel capsules   OVER THE COUNTER MEDICATION Take 1 capsule by mouth continuous dialysis. OTC black seed oil gel capsule   oxyCODONE  15 MG immediate release tablet Commonly known as: ROXICODONE  Take 1 tablet (15 mg total) by mouth every 4 (four) hours as needed for moderate pain (pain score 4-6).   predniSONE  20 MG tablet Commonly known as: DELTASONE  Take 20-60 mg by mouth See admin instructions. Take 3 tablets (60mg ) by mouth daily for 3 days, then take 2 tablets (40mg ) daily for 2 days, then take 1 tablet (20mg ) daily for 2 days.   progesterone  100 MG capsule Commonly known as: PROMETRIUM  Take 100 mg by mouth daily.   rizatriptan  10 MG  disintegrating tablet Commonly known as: MAXALT -MLT Take 1 tablet (10 mg total) by mouth as needed for migraine. May repeat in 2 hours if needed   senna-docusate 8.6-50 MG tablet Commonly known as: Senokot-S Take 1 tablet by mouth daily. What changed:  when to take this reasons to take this   thyroid  30 MG tablet Commonly known as: Armour Thyroid  Take 1 tablet (30 mg total) by mouth daily.   VITAMIN D -3 PO Take 1 capsule by mouth daily.   vortioxetine  HBr 20 MG Tabs tablet Commonly known as: TRINTELLIX  Take 20 mg by mouth daily.   zolpidem 12.5 MG CR tablet Commonly known as: AMBIEN CR Take 12.5 mg by mouth at bedtime.        Disposition: home   Final Dx: PLIF L3-4, L4-5  Discharge Instructions      Remove dressing in 72 hours   Complete by: As directed    Call MD for:   Complete by: As directed    Call MD for:  difficulty breathing, headache or visual disturbances   Complete by: As directed    Call MD for:  extreme fatigue   Complete by: As directed    Call MD for:  hives   Complete by: As directed    Call MD for:  persistant dizziness or light-headedness   Complete by: As directed    Call MD for:  persistant nausea and vomiting   Complete by: As directed    Call MD for:  redness, tenderness, or signs of infection (pain, swelling, redness, odor or green/yellow discharge around incision site)   Complete by: As directed    Call MD for:  severe uncontrolled pain   Complete by: As directed    Call MD for:  temperature >100.4   Complete by: As directed    Diet - low sodium heart healthy   Complete by: As directed    Driving Restrictions   Complete by: As directed    No driving for 2 weeks, no riding in the car for 1 week   Increase activity slowly   Complete by: As directed    Lifting restrictions   Complete by: As directed    No lifting more than 8 lbs          Signed: Suzen Lacks Jazalyn Mondor 01/23/2024, 7:52 AM

## 2024-01-23 NOTE — Plan of Care (Signed)

## 2024-01-23 NOTE — Progress Notes (Signed)
 Patient awaiting family for discharge home, Patient in no acute distress nor complaints of pain nor discomfort; incision on back is clean, dry and intact; No c/o pain at this time. Room was checked and accounted for all patient's belongings; discharge instructions concerning her medications, incision care, follow up appointment and when to call the doctor as needed were all discussed with patient and friend by RN and they both expressed understanding on the instructions given

## 2024-01-23 NOTE — Anesthesia Postprocedure Evaluation (Signed)
 Anesthesia Post Note  Patient: Abigail Gomez  Procedure(s) Performed: POSTERIOR LUMBAR INTERBODY FUSION LUMBAR THREE-FOUR/LUMBAR FOUR-FIVE POSTERIOR LATERAL AND INTERBODY FUSION (Back)     Patient location during evaluation: PACU Anesthesia Type: General Level of consciousness: awake and alert Pain management: pain level controlled Vital Signs Assessment: post-procedure vital signs reviewed and stable Respiratory status: spontaneous breathing, nonlabored ventilation, respiratory function stable and patient connected to nasal cannula oxygen Cardiovascular status: blood pressure returned to baseline and stable Postop Assessment: no apparent nausea or vomiting Anesthetic complications: no   No notable events documented.  Last Vitals:  Vitals:   01/23/24 0503 01/23/24 0809  BP: (!) 145/76 110/82  Pulse: 68 65  Resp:  17  Temp: 36.7 C 36.6 C  SpO2: 100% 100%    Last Pain:  Vitals:   01/23/24 0850  TempSrc:   PainSc: 5                  Lynwood MARLA Cornea

## 2024-01-23 NOTE — Evaluation (Signed)
 Occupational Therapy Evaluation Patient Details Name: Abigail Gomez MRN: [id number] DOB: 11-07-1965 Today's Date: 01/23/2024   History of Present Illness   Abigail Gomez is a 58 yo female who is s/p PLIF L3-4, L4-5 11/12. PMHx: allergy, anemia, anxiety, depression, fatty liver, fibromyalgia, sickle cell, HTN, hypothyroidism, OA, osteoporosis     Clinical Impressions Abigail Gomez was evaluated s/p the above spine surgery. 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 Upon evaluation pt was limited by new L hand painful paraesthesias, surgical pain, spinal precautions, knowledge of compensatory techniques and decreased activity tolerance. Overall she demonstrated ability to mobilize and complete ADLs with generalized superivsion A for safety. Provided cues and education on spinal precautions and compensatory techniques throughout, handout provided and pt demonstrated good recall during ADLs and mobility. Increased emphasis placed on pt not using heat at surgical site, pt verbalized understanding. Pt does not require further acute OT services. Recommend d/c home with support of family.   Pending L hand symptoms and MD clearance, pt may benefit from OP OT to address new symptoms.        If plan is discharge home, recommend the following:   Assistance with cooking/housework;Assist for transportation     Functional Status Assessment   Patient has had a recent decline in their functional status and demonstrates the ability to make significant improvements in function in a reasonable and predictable amount of time.     Equipment Recommendations   None recommended by OT      Precautions/Restrictions   Precautions Precautions: Fall;Back Precaution Booklet Issued: Yes (comment) Recall of Precautions/Restrictions: Intact Restrictions Weight Bearing Restrictions Per Provider Order: No     Mobility Bed Mobility Overal bed mobility: Needs  Assistance Bed Mobility: Supine to Sit, Sit to Supine           General bed mobility comments: pt's bed is adjustable at home, demonstrated good bed mobility with HOB elevated    Transfers Overall transfer level: Needs assistance Equipment used: None Transfers: Sit to/from Stand Sit to Stand: Supervision                  Balance Overall balance assessment: No apparent balance deficits (not formally assessed)                                         ADL either performed or assessed with clinical judgement   ADL Overall ADL's : Needs assistance/impaired                                       General ADL Comments: pt required cues for compensatory techniques and spinal precuations. generalized supervision A provided for safety only, no DME for ADLs. Recommend RW for lnger distances     Vision Baseline Vision/History: 0 No visual deficits Vision Assessment?: No apparent visual deficits     Perception Perception: Within Functional Limits       Praxis Praxis: WFL       Pertinent Vitals/Pain Pain Assessment Pain Assessment: No/denies pain     Extremity/Trunk Assessment Upper Extremity Assessment Upper Extremity Assessment: LUE deficits/detail LUE Deficits / Details: painful burning and  pins adn needle senation throughout hand, pt reports digits 1-3 are completely numb at the tip. Pt is not  using her L hand despite cues LUE Sensation: decreased light touch;decreased proprioception LUE Coordination: decreased fine motor;decreased gross motor   Lower Extremity Assessment Lower Extremity Assessment: Defer to PT evaluation   Cervical / Trunk Assessment Cervical / Trunk Assessment: Back Surgery   Communication Communication Communication: No apparent difficulties   Cognition Arousal: Alert Behavior During Therapy: Anxious Cognition: No apparent impairments           Following commands: Intact       Cueing  General  Comments   Cueing Techniques: Verbal cues  VSS on RA, friend present           Home Living Family/patient expects to be discharged to:: Private residence Living Arrangements: Alone Available Help at Discharge: Friend(s);Family;Available PRN/intermittently Type of Home: House Home Access: Level entry     Home Layout: Two level;Able to live on main level with bedroom/bathroom     Bathroom Shower/Tub: Producer, Television/film/video: Standard     Home Equipment: Agricultural Consultant (2 wheels);Cane - single point;Shower seat          Prior Functioning/Environment Prior Level of Function : Independent/Modified Independent               ADLs Comments: 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 who is relatively bed bound and needs total A for all aspects of ADLs - pt has hired help but is worried she will not be able to afford it for very long    OT Problem List: Decreased safety awareness;Decreased knowledge of precautions   OT Treatment/Interventions:        OT Goals(Current goals can be found in the care plan section)   Acute Rehab OT Goals Patient Stated Goal: home OT Goal Formulation: With patient Time For Goal Achievement: 01/23/24 Potential to Achieve Goals: Good   AM-PAC OT 6 Clicks Daily Activity     Outcome Measure Help from another person eating meals?: None Help from another person taking care of personal grooming?: None Help from another person toileting, which includes using toliet, bedpan, or urinal?: A Little Help from another person bathing (including washing, rinsing, drying)?: A Little Help from another person to put on and taking off regular upper body clothing?: A Little Help from another person to put on and taking off regular lower body clothing?: A Little 6 Click Score: 20   End of Session Nurse Communication: Mobility status  Activity Tolerance: Patient tolerated treatment well Patient left: in bed;with call bell/phone within  reach;with family/visitor present  OT Visit Diagnosis: Muscle weakness (generalized) (M62.81);Pain                Time: 9198-9162 OT Time Calculation (min): 36 min Charges:  OT General Charges $OT Visit: 1 Visit OT Evaluation $OT Eval Moderate Complexity: 1 Mod OT Treatments $Self Care/Home Management : 8-22 mins  Lucie Kendall, OTR/L Acute Rehabilitation Services Office [phone] Secure Chat Communication Preferred   Lucie JONETTA Kendall 01/23/2024, 9:50 AM

## 2024-01-25 DIAGNOSIS — F331 Major depressive disorder, recurrent, moderate: Secondary | ICD-10-CM | POA: Diagnosis not present

## 2024-01-25 DIAGNOSIS — F607 Dependent personality disorder: Secondary | ICD-10-CM | POA: Diagnosis not present

## 2024-01-25 DIAGNOSIS — F411 Generalized anxiety disorder: Secondary | ICD-10-CM | POA: Diagnosis not present

## 2024-01-26 NOTE — Progress Notes (Signed)
 Established Patient Office Visit   Subjective  Patient ID: Abigail Gomez, female    DOB: 1965-10-24  Age: 58 y.o. MRN: [id number]  Chief Complaint  Patient presents with   Annual Exam    Patient is a 58 year old female with pmh sig for h/o OA  s/p b/l THR, spinal stenosis, arthritis, fatty liver dz., chronic pain, depression, hypothyroidism, migraines, fibromyalgia, GERD, HTN, urinary incontinence with sacral nerve stimulator in place, Sjogren's, HLD, seen for CPE and f/u.  H/o chronic back pain due to a pinched nerve in her lumbar region. Scheduled for lumbar fusion at L3, L4, and L5 next month.  P in ain is described as feeling like 'ten pound weights on each of my legs' and is exacerbated by stressors such as planning a funeral and caring for her family. She has experienced multiple falls due to nerve issues, with symptoms including allodynia, tingling, and tenderness, particularly affecting her right leg.  Ortho feels her leg discomfort is from her back, not the hip. Pt anxious about the upcoming surgery and its outcomes, hoping it will alleviate her pain without causing further complications.  Does not want to be on opioids.  Hoping pain can be managed with  Journavx and naltrexone.  H/O non-alcoholic fatty liver disease and is cautious about medications that could exacerbate this condition.  She has been experiencing a 'crook in her neck' for the past three days, previously treated with a steroid dose pack that provided relief. Feels like another dose pack may be needed.  Aware of potential issues from frequent steroid use.     Patient Active Problem List   Diagnosis Date Noted   Spinal stenosis of lumbar region 01/22/2024   Fatty liver 10/18/2023   Sacral nerve stimulator present 10/18/2023   Lumbar foraminal stenosis 10/18/2023   Chronic midline low back pain without sciatica 10/18/2023   Chronic joint pain 10/18/2023   Sjogren's syndrome with inflammatory arthritis  01/04/2023   Iliopsoas bursitis of both hips 01/04/2023   Urinary incontinence 01/04/2023   Physical exam 08/31/2016   Hyperlipidemia 02/28/2016   Osteoarthritis of right hip 02/28/2016   Status post total replacement of right hip 02/28/2016   Obstructive sleep apnea syndrome 02/01/2016   Pain of right hip joint 02/01/2016   Hypertension 01/31/2016   Restless leg syndrome 01/31/2016   Sickle cell trait 01/31/2016   Gastroesophageal reflux 01/31/2016   History of bunionectomy of both great toes 01/31/2016   History of pelvic fracture 01/31/2016   Trochanteric bursitis, right hip 01/26/2016   Anxiety and depression 09/19/2015   Fibromyalgia 09/19/2015   Migraines 09/19/2015   Hypothyroidism 09/19/2015   Insomnia 08/20/2015   Hypersomnia 08/20/2015   Depression 08/20/2015   Past Medical History:  Diagnosis Date   Allergy    Anemia    Anxiety    Depression    Disease    lymes disease   Fatty liver    non fatty liver disease per patient   Fibromyalgia    GERD (gastroesophageal reflux disease)    H/o Lyme disease    H/O sickle cell trait    History of pelvic fracture    History of urinary incontinence    Hypertension    Hypothyroidism    Insomnia    Labral tear of hip joint    Right Hip   Migraine    Osteoarthritis    Osteoporosis    RLS (restless legs syndrome)    Sleep apnea    cpap  Spinal stenosis    Thyroid  disease    TMJ syndrome    Vitamin D  deficiency    Past Surgical History:  Procedure Laterality Date   ABDOMINAL HYSTERECTOMY     BRAIN SURGERY  2007   Chiarimalformation   BUNIONECTOMY Bilateral    chiari malformation     ENDOMETRIAL ABLATION     JOINT REPLACEMENT  2017,2018, 2021, 20222   MYOMECTOMY     OOPHORECTOMY     TONSILLECTOMY AND ADENOIDECTOMY     TOTAL HIP ARTHROPLASTY Right 02/28/2016   TOTAL HIP ARTHROPLASTY Right 02/28/2016   Procedure: RIGHT TOTAL HIP ARTHROPLASTY ANTERIOR APPROACH;  Surgeon: Lonni CINDERELLA Poli, MD;   Location: [facility] OR;  Service: Orthopedics;  Laterality: Right;   Social History   Tobacco Use   Smoking status: Never   Smokeless tobacco: Never  Vaping Use   Vaping status: Never Used  Substance Use Topics   Alcohol use: Yes    Alcohol/week: 0.0 standard drinks of alcohol    Comment: occ   Drug use: No   Family History  Problem Relation Age of Onset   Hyperlipidemia Mother    Diabetes Mother    Hearing loss Mother    Vision loss Mother    Diabetes Father    Hypertension Father    Cancer Father        prostate   Alcohol abuse Father    Vision loss Father    Diabetes Sister    Hypertension Sister    ADD / ADHD Sister    Obesity Sister    Arthritis Maternal Grandmother    Stroke Maternal Grandmother    Hypertension Maternal Grandmother    Hyperlipidemia Maternal Grandmother    Hyperkalemia Maternal Grandmother    Heart disease Maternal Grandmother    Varicose Veins Maternal Grandmother    Arthritis Paternal Grandmother    Diabetes Sister    Hearing loss Sister    Allergies  Allergen Reactions   Peanut Oil Itching   Penicillins Anaphylaxis and Other (See Comments)    Syncope   Enulose [Lactulose] Diarrhea, Nausea Only and Other (See Comments)    GI intolerance    Food Itching and Swelling    Tree nuts-itching, swelling    Lactose Intolerance (Gi) Diarrhea and Other (See Comments)    GI intolerance    Lyrica [Pregabalin] Other (See Comments)    Dizziness Confusion    Zestril [Lisinopril] Cough   Banana Itching, Swelling and Rash   Mustard Oil [Allyl Isothiocyanate] Hives and Rash    ROS Negative unless stated above    Objective:     BP 130/72 (BP Location: Left Arm, Patient Position: Sitting, Cuff Size: Large)   Pulse 73   Temp 98.2 F (36.8 C) (Oral)   Ht 5' 8 (1.727 m)   Wt 148 lb (67.1 kg)   SpO2 99%   BMI 22.50 kg/m   Physical Exam Constitutional:      Appearance: Normal appearance.  HENT:     Head: Normocephalic and atraumatic.      Right Ear: Tympanic membrane, ear canal and external ear normal. There is impacted cerumen.     Left Ear: Tympanic membrane, ear canal and external ear normal. There is impacted cerumen.     Nose: Nose normal.     Mouth/Throat:     Mouth: Mucous membranes are moist.     Pharynx: No oropharyngeal exudate or posterior oropharyngeal erythema.  Eyes:     General: No scleral icterus.  Extraocular Movements: Extraocular movements intact.     Conjunctiva/sclera: Conjunctivae normal.     Pupils: Pupils are equal, round, and reactive to light.  Neck:     Thyroid : No thyromegaly.     Vascular: No carotid bruit.  Cardiovascular:     Rate and Rhythm: Normal rate and regular rhythm.     Pulses: Normal pulses.     Heart sounds: Normal heart sounds. No murmur heard.    No friction rub.  Pulmonary:     Effort: Pulmonary effort is normal.     Breath sounds: Normal breath sounds. No wheezing, rhonchi or rales.  Abdominal:     General: Bowel sounds are normal.     Palpations: Abdomen is soft.     Tenderness: There is no abdominal tenderness.  Musculoskeletal:        General: No deformity. Normal range of motion.  Lymphadenopathy:     Cervical: No cervical adenopathy.  Skin:    General: Skin is warm and dry.     Findings: No lesion.     Comments: Ambulating with cane.  Gait abnormal, favoring R hip/RLE.  Neurological:     General: No focal deficit present.     Mental Status: She is alert and oriented to person, place, and time.  Psychiatric:        Mood and Affect: Mood normal.        Thought Content: Thought content normal.        01/01/2024   12:09 PM 10/17/2023    4:03 PM 08/09/2023    9:54 AM  Depression screen PHQ 2/9  Decreased Interest 2 1 3   Down, Depressed, Hopeless 3 1 3   PHQ - 2 Score 5 2 6   Altered sleeping 3 0 3  Tired, decreased energy 1 0 3  Change in appetite 1 1 3   Feeling bad or failure about yourself  1 0 3  Trouble concentrating 1 0 3  Moving slowly or  fidgety/restless 2 1 3   Suicidal thoughts 0 0 --  PHQ-9 Score 14  4  24    Difficult doing work/chores Very difficult       Data saved with a previous flowsheet row definition      01/01/2024   12:09 PM 10/17/2023    4:05 PM 08/09/2023    9:55 AM 06/27/2023    5:27 PM  GAD 7 : Generalized Anxiety Score  Nervous, Anxious, on Edge 3 1 3 3   Control/stop worrying 2 1 3 3   Worry too much - different things 2 1 3 3   Trouble relaxing 3 0 3 3  Restless 1 1 0 3  Easily annoyed or irritable 2 1 3 3   Afraid - awful might happen 1 0 3 3  Total GAD 7 Score 14 5 18 21   Anxiety Difficulty Very difficult   Extremely difficult      Assessment & Plan:   Well adult exam -     CBC with Differential/Platelet; Future -     Comprehensive metabolic panel with GFR; Future -     Hemoglobin A1c; Future -     Lipid panel; Future -     T4, free; Future -     TSH; Future  Fibromyalgia -     Vitamin B12; Future -     VITAMIN D  25 Hydroxy (Vit-D Deficiency, Fractures); Future -     Anti-nuclear ab-titer (ANA titer)  Chronic joint pain -     Rheumatoid factor -  ANA -     Anti-nuclear ab-titer (ANA titer)  Spinal stenosis, unspecified spinal region  Primary hypertension -     T4, free; Future -     TSH; Future  Hypothyroidism, unspecified type  History of revision of total replacement of right hip joint -     Sedimentation rate -     C-reactive protein -     Anti-nuclear ab-titer (ANA titer)  Fatty liver -     Comprehensive metabolic panel with GFR; Future   Age-appropriate health screenings discussed.  Obtain labs.  Immunizations reviewed.  Consider influenza vaccine and pneumonia vaccine.  Pap and mammogram scheduled for November.  Colonoscopy done 07/31/2019.  HTN controlled.  Continue losartan -hydrochlorothiazide  100-25 mg half tab daily and Lopressor  50 mg twice daily.  Continue lifestyle modifications.  Continue Armour Thyroid  30 mg for hypothyroidism.  Will look into alternative  medications in the event medication is no longer manufactured.  PHQ-9 score and GAD-7 score 14.  Likely elevated due to increased stress and upcoming surgery for lumbar fusion.  Continue current medications and follow-up with [facility].  Continue diet and exercise modifications for history of fatty liver disease.  Per chart review abdominal ultrasound 11/17/2020 with steatosis.  Consider repeating imaging after pt's surgery.  Cerumen on exam, OTC Debrox as needed.  Return if symptoms worsen or fail to improve.  Next CPE in 1 year  Clotilda JONELLE Single, MD

## 2024-01-27 MED FILL — Heparin Sodium (Porcine) Inj 1000 Unit/ML: INTRAMUSCULAR | Qty: 30 | Status: AC

## 2024-01-27 MED FILL — Sodium Chloride IV Soln 0.9%: INTRAVENOUS | Qty: 2000 | Status: AC

## 2024-01-29 ENCOUNTER — Telehealth: Payer: Self-pay | Admitting: *Deleted

## 2024-01-29 NOTE — Telephone Encounter (Signed)
 Copied from CRM [id number]. Topic: Clinical - Request for Lab/Test Order >> Jan 29, 2024  9:31 AM Lonell PEDLAR wrote: Reason for CRM: Patient called stating that she would like the genetic testing that Dr. Mercer ordered to be sent to her. Patient advised that date of collection was 09/18/2023. Reviewed patient chart, but could not find req for testing or testing results. Patient would like a call back asap to have this lab sent to her.

## 2024-01-29 NOTE — Telephone Encounter (Signed)
 See other note

## 2024-01-29 NOTE — Telephone Encounter (Signed)
 Called and spoke with patient, Dr. Mercer is aware,

## 2024-02-01 DIAGNOSIS — F331 Major depressive disorder, recurrent, moderate: Secondary | ICD-10-CM | POA: Diagnosis not present

## 2024-02-01 DIAGNOSIS — F607 Dependent personality disorder: Secondary | ICD-10-CM | POA: Diagnosis not present

## 2024-02-01 DIAGNOSIS — F411 Generalized anxiety disorder: Secondary | ICD-10-CM | POA: Diagnosis not present

## 2024-02-03 ENCOUNTER — Other Ambulatory Visit: Payer: Self-pay | Admitting: Family Medicine

## 2024-02-03 DIAGNOSIS — M797 Fibromyalgia: Secondary | ICD-10-CM

## 2024-02-03 NOTE — Telephone Encounter (Signed)
 Copied from CRM #[id number]. Topic: Clinical - Medication Refill >> Feb 03, 2024  2:01 PM Pinkey ORN wrote: Medication: lidocaine  (LIDODERM ) 5 %  Has the patient contacted their pharmacy? Yes (Agent: If no, request that the patient contact the pharmacy for the refill. If patient does not wish to contact the pharmacy document the reason why and proceed with request.) (Agent: If yes, when and what did the pharmacy advise?)  This is the patient's preferred pharmacy:   [facility]  [address]    Is this the correct pharmacy for this prescription? Yes If no, delete pharmacy and type the correct one.   Has the prescription been filled recently? No  Is the patient out of the medication? Yes  Has the patient been seen for an appointment in the last year OR does the patient have an upcoming appointment? Yes  Can we respond through MyChart? Yes  Agent: Please be advised that Rx refills may take up to 3 business days. We ask that you follow-up with your pharmacy. >> Feb 03, 2024  2:02 PM Pinkey ORN wrote: Patient is requesting a 90-day supply.

## 2024-02-05 ENCOUNTER — Encounter: Payer: Self-pay | Admitting: Family Medicine

## 2024-02-05 ENCOUNTER — Telehealth: Admitting: Family Medicine

## 2024-02-05 DIAGNOSIS — Z981 Arthrodesis status: Secondary | ICD-10-CM | POA: Diagnosis not present

## 2024-02-05 DIAGNOSIS — M79642 Pain in left hand: Secondary | ICD-10-CM | POA: Diagnosis not present

## 2024-02-05 DIAGNOSIS — G8929 Other chronic pain: Secondary | ICD-10-CM

## 2024-02-05 DIAGNOSIS — M797 Fibromyalgia: Secondary | ICD-10-CM | POA: Diagnosis not present

## 2024-02-05 MED ORDER — AMITRIPTYLINE HCL 150 MG PO TABS: 150.0000 mg | ORAL_TABLET | Freq: Every day | ORAL | 2 refills | Status: DC

## 2024-02-05 NOTE — Progress Notes (Signed)
 Virtual Visit via Telephone Note  I connected with Abigail Gomez on 02/05/24 at  8:00 AM EST by telephone as provider's video not working and verified that I am speaking with the correct person using two identifiers.   I discussed the limitations, risks, security and privacy concerns of performing an evaluation and management service by telephone and the availability of in person appointments. I also discussed with the patient that there may be a patient responsible charge related to this service. The patient expressed understanding and agreed to proceed.  Location patient: home Location provider: work office Participants present for the call: patient, provider Patient did not have a visit in the prior 7 days to address this/these issue(s). Chief Complaint  Patient presents with   Medical Management of Chronic Issues    Back surg 11/12 and left hand the patient is unable to use     History of Present Illness: Pt is a 58 yo female with extensive pmh seen for f/u.    Pt had back surgery 01/22/24.  Immediately after waking up in recovery she had burning in L arm/hand.  Pt given prednisone  x 3 in hospital, states it didn't help.  Given prednisone  taper which caused yeast infection.  Also given gabapentin  which in the past caused nightmares and somnolence.  Pt increased elavil  from 50 to 100 mg at bedtime.  States helped her sleep, but maybe took the edge off of the pain.  Pt states her surgeon, Dr. Onetha didn't feel comfortable rx'ing additional depression med for pain and advised to see pcp.  Pt was on cymbalta in the past, but would prefer not to be back on it.  Has pain in 1-4th digits with barely brushing over palm.  5th digit is ok.  Tips of fingers are cold, numb.  Has not noticed any edema in hand or wrist.  Pt is right handed.  No pain radiating up arm. Pt states if has arm down it hurts.  If holds against chest and up feels a little better.  Wearing a brace.   States surgeon was ordering an  MRI of her hand and referred her to hand surgery.  Has an appt with Dr. Anshul Agarwala on Dec 15th.   Pt mentions prior h/o carpal tunnel.  Pt mentions she can only have MRI with [facility] due to presence of bladder stimulator.    Observations/Objective: Patient sounds alert and frustrated on the phone. I do not appreciate any SOB. Speech and thought processing are grossly intact. Patient reported vitals:  Assessment and Plan:   Follow Up Instructions:  Pain of left hand - Plan: amitriptyline  (ELAVIL ) 150 MG tablet  Other chronic pain - Plan: amitriptyline  (ELAVIL ) 150 MG tablet  Fibromyalgia - Plan: amitriptyline  (ELAVIL ) 150 MG tablet  S/P lumbar fusion   New continuous L hand pain, paresthesia, and LUE pain s/p lumbar spinal fusion.  Initial workup with neurosurgery as happened in recovery rm.  Referral placed to hand surgery and MRI to be ordered by neurosurgery.  Pt to f/u with Neurosurg regarding HH s/p surgery and the MRI location.  Pt again advised against self adjusting medication doses.  Will increased elavil  from 100 mg at bedtime (pt increased dose) to 150 mg qhs.  Advised 150 mg is the max dose.  Caution for serotonin syndrome give use of other meds.   F/u with pt's [facility] provider advised and pain management.    F/u prn  I did not refer this patient for an OV  in the next 24 hours for this/these issue(s).  I discussed the assessment and treatment plan with the patient. The patient was provided an opportunity to ask questions and all were answered. The patient agreed with the plan and demonstrated an understanding of the instructions.   The patient was advised to call back or seek an in-person evaluation if the symptoms worsen or if the condition fails to improve as anticipated.  I provided 23 minutes of non-face-to-face time during this encounter.     Abigail JONELLE Single, MD

## 2024-02-08 DIAGNOSIS — M797 Fibromyalgia: Secondary | ICD-10-CM | POA: Diagnosis not present

## 2024-02-08 DIAGNOSIS — M48062 Spinal stenosis, lumbar region with neurogenic claudication: Secondary | ICD-10-CM | POA: Diagnosis not present

## 2024-02-08 DIAGNOSIS — F32A Depression, unspecified: Secondary | ICD-10-CM | POA: Diagnosis not present

## 2024-02-08 DIAGNOSIS — Z9181 History of falling: Secondary | ICD-10-CM | POA: Diagnosis not present

## 2024-02-08 DIAGNOSIS — Z960 Presence of urogenital implants: Secondary | ICD-10-CM | POA: Diagnosis not present

## 2024-02-08 DIAGNOSIS — M5416 Radiculopathy, lumbar region: Secondary | ICD-10-CM | POA: Diagnosis not present

## 2024-02-08 DIAGNOSIS — F419 Anxiety disorder, unspecified: Secondary | ICD-10-CM | POA: Diagnosis not present

## 2024-02-08 DIAGNOSIS — I10 Essential (primary) hypertension: Secondary | ICD-10-CM | POA: Diagnosis not present

## 2024-02-10 ENCOUNTER — Encounter: Payer: Self-pay | Admitting: Neurosurgery

## 2024-02-12 ENCOUNTER — Ambulatory Visit: Payer: Self-pay

## 2024-02-12 ENCOUNTER — Encounter: Payer: Self-pay | Admitting: Neurosurgery

## 2024-02-12 ENCOUNTER — Other Ambulatory Visit: Payer: Self-pay | Admitting: Neurosurgery

## 2024-02-12 DIAGNOSIS — G5600 Carpal tunnel syndrome, unspecified upper limb: Secondary | ICD-10-CM

## 2024-02-12 DIAGNOSIS — M542 Cervicalgia: Secondary | ICD-10-CM

## 2024-02-12 NOTE — Telephone Encounter (Signed)
 FYI Only or Action Required?: FYI only for provider: appointment scheduled on 02/13/24.  Patient was last seen in primary care on 01/01/2024 by Mercer Clotilda SAUNDERS, MD.  Called Nurse Triage reporting Hypotension.  Symptoms began today.  Interventions attempted: Rest, hydration, or home remedies.  Symptoms are: stable.  Triage Disposition: See PCP When Office is Open (Within 3 Days)  Patient/caregiver understands and will follow disposition?: Yes  Copied from CRM #[id number]. Topic: Clinical - Red Word Triage >> Feb 12, 2024 11:24 AM Anairis L wrote: [facility] that prompted transfer to Nurse Triage: Beth from [facility] BP 90/56 dizzy and lightheaded Reason for Disposition  Brief (now gone) weakness or lightheadedness after standing up or eating  Answer Assessment - Initial Assessment Questions Beth, from [facility], currently with patient. BP is 90/56 x2 readings. Pt was dizzy and lightheaded during initial assessment so [facility] RN contacted office. Discussed pt's medications to verify dose and upon assessment, pt is not taking medication as directed. Pt is taking Metoprolol  100mg  daily; losartan -hydrochlorothiazide  (HYZAAR) 100-25 MG tablet 0.5 tablet per [facility] RN. Per med list metoprolol  50mg  one tablet twice a day. Pt is sitting at this time, is not alone and denies any lightheadness or dizziness. Pt is also having difficulty with constipation despite being on senna and dulcolax. HH RN reports pt is not eating or drinking much; approx 24 oz of fluid daily with 2 small snacks. Based on symptoms discussed pt will need to f/u with PCP. HH RN is going to recheck BP, is pushing oral fluids and had pt remain sitting while eating salty snack. Pt denies distress and in safe environment. Appointment scheduled for evaluation. Patient agrees with plan of care, and will call back if anything changes, or if symptoms worsen.     1. BLOOD PRESSURE: What is your blood pressure? Did you take at least two measurements  5 minutes apart?     90/56 x2 once at sitting and once laying down;20 minutes apart  2. ONSET: When did you take your blood pressure?     10:30am; pt just woke up and in bed per [facility] nurse   3. HOW: How did you take your blood pressure? (e.g., visiting nurse, automatic home BP monitor)     Manually per [facility] nurse   4. HISTORY: Do you have a history of low blood pressure? What is your blood pressure normally?     Pt reports usually higher   5. MEDICINES: Are you taking any medicines for blood pressure? If Yes, ask: Have they been changed recently?     Valsartan and metoprolol ; took both this morning   6. PULSE RATE: Do you know what your pulse rate is?       76 bpm       7. OTHER SYMPTOMS: Have you been sick recently? Have you had a recent injury?     none  Protocols used: Blood Pressure - Low-A-AH

## 2024-02-13 ENCOUNTER — Ambulatory Visit: Admitting: Family Medicine

## 2024-02-13 ENCOUNTER — Encounter: Payer: Self-pay | Admitting: Neurosurgery

## 2024-02-13 DIAGNOSIS — M797 Fibromyalgia: Secondary | ICD-10-CM | POA: Diagnosis not present

## 2024-02-13 DIAGNOSIS — M3501 Sicca syndrome with keratoconjunctivitis: Secondary | ICD-10-CM | POA: Diagnosis not present

## 2024-02-13 DIAGNOSIS — F5104 Psychophysiologic insomnia: Secondary | ICD-10-CM | POA: Diagnosis not present

## 2024-02-13 DIAGNOSIS — F32A Depression, unspecified: Secondary | ICD-10-CM | POA: Diagnosis not present

## 2024-02-14 ENCOUNTER — Inpatient Hospital Stay: Admission: RE | Admit: 2024-02-14 | Discharge: 2024-02-14 | Attending: Neurosurgery | Admitting: Neurosurgery

## 2024-02-14 ENCOUNTER — Ambulatory Visit
Admission: RE | Admit: 2024-02-14 | Discharge: 2024-02-14 | Disposition: A | Discharge status: Home / Self Care | Source: Ambulatory Visit | Attending: Neurosurgery | Admitting: Neurosurgery

## 2024-02-14 DIAGNOSIS — M50123 Cervical disc disorder at C6-C7 level with radiculopathy: Secondary | ICD-10-CM | POA: Diagnosis not present

## 2024-02-14 DIAGNOSIS — M4802 Spinal stenosis, cervical region: Secondary | ICD-10-CM | POA: Diagnosis not present

## 2024-02-14 DIAGNOSIS — G5600 Carpal tunnel syndrome, unspecified upper limb: Secondary | ICD-10-CM

## 2024-02-14 DIAGNOSIS — M5011 Cervical disc disorder with radiculopathy,  high cervical region: Secondary | ICD-10-CM | POA: Diagnosis not present

## 2024-02-14 DIAGNOSIS — M542 Cervicalgia: Secondary | ICD-10-CM

## 2024-02-14 DIAGNOSIS — M4722 Other spondylosis with radiculopathy, cervical region: Secondary | ICD-10-CM | POA: Diagnosis not present

## 2024-02-19 ENCOUNTER — Other Ambulatory Visit: Payer: Self-pay

## 2024-02-19 DIAGNOSIS — M797 Fibromyalgia: Secondary | ICD-10-CM

## 2024-02-20 DIAGNOSIS — Z6825 Body mass index (BMI) 25.0-25.9, adult: Secondary | ICD-10-CM | POA: Diagnosis not present

## 2024-02-20 DIAGNOSIS — Z1272 Encounter for screening for malignant neoplasm of vagina: Secondary | ICD-10-CM | POA: Diagnosis not present

## 2024-02-20 DIAGNOSIS — Z01419 Encounter for gynecological examination (general) (routine) without abnormal findings: Secondary | ICD-10-CM | POA: Diagnosis not present

## 2024-02-20 DIAGNOSIS — Z113 Encounter for screening for infections with a predominantly sexual mode of transmission: Secondary | ICD-10-CM | POA: Diagnosis not present

## 2024-02-20 DIAGNOSIS — N760 Acute vaginitis: Secondary | ICD-10-CM | POA: Diagnosis not present

## 2024-02-23 NOTE — Progress Notes (Unsigned)
 VALETA PAZ - 58 y.o. female MRN [id number]  Date of birth: 02/29/1966  Office Visit Note: Visit Date: 02/24/2024 PCP: Mercer Clotilda SAUNDERS, MD Referred by: Mercer Clotilda SAUNDERS, MD  Subjective: No chief complaint on file.  HPI: TAKYIA SINDT is a pleasant 58 y.o. female who presents today for ***  Pertinent ROS were reviewed with the patient and found to be negative unless otherwise specified above in HPI.   Visit Reason: Duration of symptoms: Hand dominance: {RIGHT/LEFT:20294} Occupation: Diabetic: {yes/no:20286} Smoking: {yes/no:20286} Heart/Lung History: Blood Thinners:   Prior Testing/EMG: Injections (Date): Treatments: Prior Surgery:    Assessment & Plan: Visit Diagnoses: No diagnosis found.  Plan: ***  Follow-up: No follow-ups on file.   Meds & Orders: No orders of the defined types were placed in this encounter.  No orders of the defined types were placed in this encounter.    Procedures: No procedures performed      Clinical History: No specialty comments available.  She reports that she has never smoked. She has never used smokeless tobacco.  Recent Labs    01/01/24 1200  HGBA1C 5.8    Objective:   Vital Signs: There were no vitals taken for this visit.  Physical Exam  Gen: Well-appearing, in no acute distress; non-toxic CV: Regular Rate. Well-perfused. Warm.  Resp: Breathing unlabored on room air; no wheezing. Psych: Fluid speech in conversation; appropriate affect; normal thought process  Ortho Exam - ***   Imaging: No results found.  Past Medical/Family/Surgical/Social History: Medications & Allergies reviewed per EMR, new medications updated. Patient Active Problem List   Diagnosis Date Noted   Spinal stenosis of lumbar region 01/22/2024   Fatty liver 10/18/2023   Sacral nerve stimulator present 10/18/2023   Lumbar foraminal stenosis 10/18/2023   Chronic midline low back pain without sciatica 10/18/2023   Chronic joint pain  10/18/2023   Sjogren's syndrome with inflammatory arthritis 01/04/2023   Iliopsoas bursitis of both hips 01/04/2023   Urinary incontinence 01/04/2023   Physical exam 08/31/2016   Hyperlipidemia 02/28/2016   Osteoarthritis of right hip 02/28/2016   Status post total replacement of right hip 02/28/2016   Obstructive sleep apnea syndrome 02/01/2016   Pain of right hip joint 02/01/2016   Hypertension 01/31/2016   Restless leg syndrome 01/31/2016   Sickle cell trait 01/31/2016   Gastroesophageal reflux 01/31/2016   History of bunionectomy of both great toes 01/31/2016   History of pelvic fracture 01/31/2016   Trochanteric bursitis, right hip 01/26/2016   Anxiety and depression 09/19/2015   Fibromyalgia 09/19/2015   Migraines 09/19/2015   Hypothyroidism 09/19/2015   Insomnia 08/20/2015   Hypersomnia 08/20/2015   Depression 08/20/2015   Past Medical History:  Diagnosis Date   Allergy    Anemia    Anxiety    Depression    Disease    lymes disease   Fatty liver    non fatty liver disease per patient   Fibromyalgia    GERD (gastroesophageal reflux disease)    H/o Lyme disease    H/O sickle cell trait    History of pelvic fracture    History of urinary incontinence    Hypertension    Hypothyroidism    Insomnia    Labral tear of hip joint    Right Hip   Migraine    Osteoarthritis    Osteoporosis    RLS (restless legs syndrome)    Sleep apnea    cpap   Spinal stenosis    Thyroid   disease    TMJ syndrome    Vitamin D  deficiency    Family History  Problem Relation Age of Onset   Hyperlipidemia Mother    Diabetes Mother    Hearing loss Mother    Vision loss Mother    Diabetes Father    Hypertension Father    Cancer Father        prostate   Alcohol abuse Father    Vision loss Father    Diabetes Sister    Hypertension Sister    ADD / ADHD Sister    Obesity Sister    Arthritis Maternal Grandmother    Stroke Maternal Grandmother    Hypertension Maternal  Grandmother    Hyperlipidemia Maternal Grandmother    Hyperkalemia Maternal Grandmother    Heart disease Maternal Grandmother    Varicose Veins Maternal Grandmother    Arthritis Paternal Grandmother    Diabetes Sister    Hearing loss Sister    Past Surgical History:  Procedure Laterality Date   ABDOMINAL HYSTERECTOMY     BRAIN SURGERY  2007   Chiarimalformation   BUNIONECTOMY Bilateral    chiari malformation     ENDOMETRIAL ABLATION     JOINT REPLACEMENT  2017,2018, 2021, 20222   MYOMECTOMY     OOPHORECTOMY     TONSILLECTOMY AND ADENOIDECTOMY     TOTAL HIP ARTHROPLASTY Right 02/28/2016   TOTAL HIP ARTHROPLASTY Right 02/28/2016   Procedure: RIGHT TOTAL HIP ARTHROPLASTY ANTERIOR APPROACH;  Surgeon: Lonni CINDERELLA Poli, MD;  Location: [facility] OR;  Service: Orthopedics;  Laterality: Right;   Social History   Occupational History   Not on file  Tobacco Use   Smoking status: Never   Smokeless tobacco: Never  Vaping Use   Vaping status: Never Used  Substance and Sexual Activity   Alcohol use: Yes    Alcohol/week: 0.0 standard drinks of alcohol    Comment: occ   Drug use: No   Sexual activity: Not on file    Valborg Friar Estela) Arlinda, M.D. [facility], Hand Surgery

## 2024-02-24 ENCOUNTER — Other Ambulatory Visit: Payer: Self-pay

## 2024-02-24 ENCOUNTER — Ambulatory Visit: Admitting: Orthopedic Surgery

## 2024-02-24 DIAGNOSIS — F607 Dependent personality disorder: Secondary | ICD-10-CM | POA: Diagnosis not present

## 2024-02-24 DIAGNOSIS — F331 Major depressive disorder, recurrent, moderate: Secondary | ICD-10-CM | POA: Diagnosis not present

## 2024-02-24 DIAGNOSIS — F411 Generalized anxiety disorder: Secondary | ICD-10-CM | POA: Diagnosis not present

## 2024-02-24 DIAGNOSIS — G5602 Carpal tunnel syndrome, left upper limb: Secondary | ICD-10-CM | POA: Diagnosis not present

## 2024-02-24 DIAGNOSIS — G894 Chronic pain syndrome: Secondary | ICD-10-CM | POA: Diagnosis not present

## 2024-02-24 MED ORDER — BETAMETHASONE SOD PHOS & ACET 6 (3-3) MG/ML IJ SUSP
6.0000 mg | INTRAMUSCULAR | Status: AC | PRN
  Administered 2024-02-24: 12:00:00 6 mg via INTRA_ARTICULAR

## 2024-02-24 MED ORDER — LIDOCAINE HCL 1 % IJ SOLN
1.0000 mL | INTRAMUSCULAR | Status: AC | PRN
  Administered 2024-02-24: 12:00:00 1 mL

## 2024-02-25 ENCOUNTER — Other Ambulatory Visit: Payer: Self-pay | Admitting: Family Medicine

## 2024-02-25 DIAGNOSIS — M542 Cervicalgia: Secondary | ICD-10-CM | POA: Diagnosis not present

## 2024-02-25 DIAGNOSIS — R293 Abnormal posture: Secondary | ICD-10-CM | POA: Diagnosis not present

## 2024-02-25 DIAGNOSIS — M797 Fibromyalgia: Secondary | ICD-10-CM

## 2024-02-25 DIAGNOSIS — M6281 Muscle weakness (generalized): Secondary | ICD-10-CM | POA: Diagnosis not present

## 2024-02-25 NOTE — Telephone Encounter (Unsigned)
 Copied from CRM #[id number]. Topic: Clinical - Medication Refill >> Feb 25, 2024  3:50 PM Suzen RAMAN wrote: Medication: lidocaine  (LIDODERM ) 5 %   Has the patient contacted their pharmacy? Yes   This is the patient's preferred pharmacy:  [id number] [place], [place] - [address] [place] [address] Phone: [phone] Fax: [phone]  Is this the correct pharmacy for this prescription? Yes If no, delete pharmacy and type the correct one.   Has the prescription been filled recently? Yes  Is the patient out of the medication? Yes  Has the patient been seen for an appointment in the last year OR does the patient have an upcoming appointment? Yes  Can we respond through MyChart? Yes  Agent: Please be advised that Rx refills may take up to 3 business days. We ask that you follow-up with your pharmacy.

## 2024-02-28 ENCOUNTER — Other Ambulatory Visit: Payer: Self-pay | Admitting: Family Medicine

## 2024-02-28 DIAGNOSIS — G8929 Other chronic pain: Secondary | ICD-10-CM

## 2024-02-28 DIAGNOSIS — M797 Fibromyalgia: Secondary | ICD-10-CM

## 2024-02-28 DIAGNOSIS — M79642 Pain in left hand: Secondary | ICD-10-CM

## 2024-02-29 ENCOUNTER — Other Ambulatory Visit: Payer: Self-pay | Admitting: Family Medicine

## 2024-02-29 DIAGNOSIS — F331 Major depressive disorder, recurrent, moderate: Secondary | ICD-10-CM | POA: Diagnosis not present

## 2024-02-29 DIAGNOSIS — K5903 Drug induced constipation: Secondary | ICD-10-CM

## 2024-02-29 DIAGNOSIS — G894 Chronic pain syndrome: Secondary | ICD-10-CM | POA: Diagnosis not present

## 2024-02-29 DIAGNOSIS — F607 Dependent personality disorder: Secondary | ICD-10-CM | POA: Diagnosis not present

## 2024-02-29 DIAGNOSIS — F411 Generalized anxiety disorder: Secondary | ICD-10-CM | POA: Diagnosis not present

## 2024-03-02 DIAGNOSIS — M6281 Muscle weakness (generalized): Secondary | ICD-10-CM | POA: Diagnosis not present

## 2024-03-02 DIAGNOSIS — M542 Cervicalgia: Secondary | ICD-10-CM | POA: Diagnosis not present

## 2024-03-02 DIAGNOSIS — R293 Abnormal posture: Secondary | ICD-10-CM | POA: Diagnosis not present

## 2024-03-03 DIAGNOSIS — M542 Cervicalgia: Secondary | ICD-10-CM | POA: Diagnosis not present

## 2024-03-03 DIAGNOSIS — Z1231 Encounter for screening mammogram for malignant neoplasm of breast: Secondary | ICD-10-CM | POA: Diagnosis not present

## 2024-03-03 DIAGNOSIS — R293 Abnormal posture: Secondary | ICD-10-CM | POA: Diagnosis not present

## 2024-03-03 DIAGNOSIS — M6281 Muscle weakness (generalized): Secondary | ICD-10-CM | POA: Diagnosis not present

## 2024-03-07 DIAGNOSIS — F411 Generalized anxiety disorder: Secondary | ICD-10-CM | POA: Diagnosis not present

## 2024-03-07 DIAGNOSIS — F607 Dependent personality disorder: Secondary | ICD-10-CM | POA: Diagnosis not present

## 2024-03-07 DIAGNOSIS — F331 Major depressive disorder, recurrent, moderate: Secondary | ICD-10-CM | POA: Diagnosis not present

## 2024-03-07 DIAGNOSIS — G894 Chronic pain syndrome: Secondary | ICD-10-CM | POA: Diagnosis not present

## 2024-03-09 ENCOUNTER — Telehealth: Payer: Self-pay | Admitting: Family Medicine

## 2024-03-09 ENCOUNTER — Ambulatory Visit: Payer: Self-pay

## 2024-03-09 NOTE — Telephone Encounter (Signed)
 FYI Only or Action Required?: FYI only for provider: ER Refusal at this time.  Patient was last seen in primary care on 01/01/2024 by Abigail Clotilda SAUNDERS, MD.  Called Nurse Triage reporting Dizziness.  Symptoms began after back surgery 01/22/2024.  Interventions attempted: Rest, hydration, or home remedies.  Symptoms are: unchanged--off and on episodes.  Triage Disposition: Go to ED Now (Notify PCP)  Patient/caregiver understands and will follow disposition?: No, wishes to speak with PCP            Copied from CRM #[id number]. Topic: Clinical - Red Word Triage >> Mar 09, 2024  2:06 PM [name] wrote: Red Word that prompted transfer to Nurse Triage: Patient is stating that she is having issues with dizziness when she gets up feels like she is going to pass out she had a surgery in nov 12 since then she has been having this issue. Dr Abigail Reason for Disposition  Loss of vision or double vision  (Exception: Similar to previous migraines.)  Answer Assessment - Initial Assessment Questions Dizziness---patient states she has been having this issue since having back surgery January 22, 2024 Patient states she doesn't know if her blood pressure was the issue. Patient felt dizzy this morning when she woke up. She also states that when she went to the neuro surgeon two weeks ago blood pressure was elevated in the 150s for the systolic number. She states that she had a spell of dizziness when she got up while on the phone with this RN.  Patient also expressed that since her surgery, her vision has had changes--she describes this as moments of blurriness. She states that these symptoms are new since her surgery.  Patient was advised that the ER was recommended for her symptoms.  Patient states that she was not going back to the Emergency Room.  Patient is advised to call us  back if anything changes and if things worsen to call 911.  She verbalized understanding of that.    2. LIGHTHEADED:  Do you feel lightheaded? (e.g., somewhat faint, woozy, weak upon standing)     Light headed 3. VERTIGO: Do you feel like either you or the room is spinning or tilting? (i.e., vertigo)      4. SEVERITY: How bad is it?  Do you feel like you are going to faint? Can you stand and walk?     ---- 5. ONSET:  When did the dizziness begin?     ---- 6. AGGRAVATING FACTORS: Does anything make it worse? (e.g., standing, change in head position)     Standing up 7. HEART RATE: Can you tell me your heart rate? How many beats in 15 seconds?  (Note: Not all patients can do this.)       --- 8. CAUSE: What do you think is causing the dizziness? (e.g., decreased fluids or food, diarrhea, emotional distress, heat exposure, new medicine, sudden standing, vomiting; unknown)     ----- 9. RECURRENT SYMPTOM: Have you had dizziness before? If Yes, ask: When was the last time? What happened that time?     --- 10. OTHER SYMPTOMS: Do you have any other symptoms? (e.g., fever, chest pain, vomiting, diarrhea, bleeding)       ---- 11. PREGNANCY: Is there any chance you are pregnant? When was your last menstrual period?       No---hysterectomy  Protocols used: Dizziness - Lightheadedness-A-AH

## 2024-03-09 NOTE — Telephone Encounter (Signed)
 Triage nurse call and stated pt refuse ER

## 2024-03-09 NOTE — Telephone Encounter (Signed)
 Called and advised Rollene of patient's symptoms and refusal of the ER at this time.

## 2024-03-10 DIAGNOSIS — R293 Abnormal posture: Secondary | ICD-10-CM | POA: Diagnosis not present

## 2024-03-10 DIAGNOSIS — M6281 Muscle weakness (generalized): Secondary | ICD-10-CM | POA: Diagnosis not present

## 2024-03-10 DIAGNOSIS — M542 Cervicalgia: Secondary | ICD-10-CM | POA: Diagnosis not present

## 2024-03-10 NOTE — Telephone Encounter (Signed)
 Called and spoke with patient, advised patient to go to the ED, patient is aware and has refused,

## 2024-03-11 DIAGNOSIS — M6281 Muscle weakness (generalized): Secondary | ICD-10-CM | POA: Diagnosis not present

## 2024-03-11 DIAGNOSIS — M542 Cervicalgia: Secondary | ICD-10-CM | POA: Diagnosis not present

## 2024-03-11 DIAGNOSIS — R293 Abnormal posture: Secondary | ICD-10-CM | POA: Diagnosis not present

## 2024-03-18 MED ORDER — AMITRIPTYLINE HCL 150 MG PO TABS: 150.0000 mg | ORAL_TABLET | Freq: Every day | ORAL | 1 refills | Status: AC

## 2024-03-18 NOTE — Addendum Note (Signed)
 Addended by: MERCER KIRSCH R on: 03/18/2024 05:11 PM   Modules accepted: Orders

## 2024-03-19 ENCOUNTER — Other Ambulatory Visit: Payer: Self-pay | Admitting: Family Medicine

## 2024-03-19 DIAGNOSIS — I10 Essential (primary) hypertension: Secondary | ICD-10-CM

## 2024-03-28 ENCOUNTER — Other Ambulatory Visit: Payer: Self-pay | Admitting: Family Medicine

## 2024-03-28 DIAGNOSIS — G43809 Other migraine, not intractable, without status migrainosus: Secondary | ICD-10-CM

## 2024-04-06 ENCOUNTER — Ambulatory Visit: Admitting: Orthopedic Surgery

## 2024-04-10 ENCOUNTER — Encounter: Payer: Self-pay | Admitting: Family Medicine

## 2024-04-10 ENCOUNTER — Ambulatory Visit: Admitting: Family Medicine

## 2024-04-10 ENCOUNTER — Ambulatory Visit: Payer: Self-pay

## 2024-04-10 VITALS — BP 128/60 | HR 90 | Temp 98.3°F | Wt 168.7 lb

## 2024-04-10 DIAGNOSIS — J029 Acute pharyngitis, unspecified: Secondary | ICD-10-CM

## 2024-04-10 DIAGNOSIS — J101 Influenza due to other identified influenza virus with other respiratory manifestations: Secondary | ICD-10-CM

## 2024-04-10 LAB — POCT INFLUENZA A/B
Influenza A, POC: POSITIVE — AB
Influenza B, POC: NEGATIVE

## 2024-04-10 LAB — POC COVID19 BINAXNOW: SARS Coronavirus 2 Ag: NEGATIVE

## 2024-04-10 MED ORDER — AZELASTINE HCL 0.1 % NA SOLN: 2.0000 | Freq: Two times a day (BID) | NASAL | 1 refills | Status: AC

## 2024-04-10 MED ORDER — BENZONATATE 100 MG PO CAPS: 100.0000 mg | ORAL_CAPSULE | Freq: Three times a day (TID) | ORAL | 0 refills | Status: AC | PRN

## 2024-04-10 NOTE — Telephone Encounter (Signed)
 FYI Only or Action Required?: FYI only for provider: appointment scheduled on 1/30.  Patient was last seen in primary care on 01/01/2024 by Mercer Clotilda SAUNDERS, MD.  Called Nurse Triage reporting Sinusitis.  Symptoms began several days ago.  Interventions attempted: OTC medications: tylenol  and ibuprofen and Rest, hydration, or home remedies.  Symptoms are: gradually worsening.  Triage Disposition: See HCP Within 4 Hours (Or PCP Triage)  Patient/caregiver understands and will follow disposition?: Yes  Message from Berwyn MATSU sent at 04/10/2024 12:59 PM EST  Reason for Triage:  Sinus cold and congested and irritated and now teeth and jaw are causing pain   Reason for Disposition  [1] SEVERE sinus pain (e.g., excruciating) AND [2] not improved 2 hours after pain medicine  Answer Assessment - Initial Assessment Questions Pt with sinus aching throbbing pain that is referring down into her jaw. Congested, clear mucous. Denies CP SOB, Fever, Dizziness, ear ache, sore throat.   Cleaning face with water and Himalayan salt tylenol  and ibuprofen with no relief  Used to happen a lot. Doesn't remember if would require abx  Appt with PCP office this afternoon to assess. Review saline washes and nasal sprays  1. LOCATION: Where does it hurt?      Nose constant dull ache  2. ONSET: When did the sinus pain start?  (e.g., hours, days)      Several days  3. SEVERITY: How bad is the pain?   (Scale 0-10; or none, mild, moderate or severe)     7/10 4. RECURRENT SYMPTOM: Have you ever had sinus problems before? If Yes, ask: When was the last time? and What happened that time?      Recurrent  5. NASAL CONGESTION: Is the nose blocked? If Yes, ask: Can you open it or must you breathe through your mouth?     Left start but now both  6. NASAL DISCHARGE: Do you have discharge from your nose? If so ask, What color?     Clear white  7. FEVER: Do you have a fever? If Yes, ask: What is  it, how was it measured, and when did it start?      Denies  8. OTHER SYMPTOMS: Do you have any other symptoms? (e.g., sore throat, cough, earache, difficulty breathing)     Mild cough  Protocols used: Sinus Pain or Congestion-A-AH

## 2024-04-10 NOTE — Telephone Encounter (Signed)
 Patient has appt 1/30

## 2024-04-10 NOTE — Progress Notes (Signed)
 "  Established Patient Office Visit  Subjective   Patient ID: Abigail Gomez, female    DOB: 1965-09-28  Age: 59 y.o. MRN: [id number]  Chief Complaint  Patient presents with   Sinusitis   Sore Throat    HPI    Abigail Gomez was seen with upper respiratory symptoms and cough worsening over the past week.  She just got back recently from [place] .  She was stuck in the airport in [place] for over 9 hours on Monday.  She has had some progressive sinusitis symptoms, sore throat, cough.  She has had some upper teeth pain and bilateral maxillary pressure.  Increased malaise.  Not aware of any fevers.  She helps care for her mother who has Lewy body dementia and she is specifically concerned whether she could have COVID or influenza.  She does have some diffuse bodyaches.  Denies any nausea, vomiting, or diarrhea.  Past Medical History:  Diagnosis Date   Allergy    Anemia    Anxiety    Depression    Disease    lymes disease   Fatty liver    non fatty liver disease per patient   Fibromyalgia    GERD (gastroesophageal reflux disease)    H/o Lyme disease    H/O sickle cell trait    History of pelvic fracture    History of urinary incontinence    Hypertension    Hypothyroidism    Insomnia    Labral tear of hip joint    Right Hip   Migraine    Osteoarthritis    Osteoporosis    RLS (restless legs syndrome)    Sleep apnea    cpap   Spinal stenosis    Thyroid  disease    TMJ syndrome    Vitamin D  deficiency    Past Surgical History:  Procedure Laterality Date   ABDOMINAL HYSTERECTOMY     BRAIN SURGERY  2007   Chiarimalformation   BUNIONECTOMY Bilateral    chiari malformation     ENDOMETRIAL ABLATION     JOINT REPLACEMENT  2017,2018, 2021, 20222   MYOMECTOMY     OOPHORECTOMY     TONSILLECTOMY AND ADENOIDECTOMY     TOTAL HIP ARTHROPLASTY Right 02/28/2016   TOTAL HIP ARTHROPLASTY Right 02/28/2016   Procedure: RIGHT TOTAL HIP ARTHROPLASTY ANTERIOR APPROACH;  Surgeon: Lonni CINDERELLA Poli, MD;  Location: [facility] OR;  Service: Orthopedics;  Laterality: Right;    reports that she has never smoked. She has never used smokeless tobacco. She reports current alcohol use. She reports that she does not use drugs. family history includes ADD / ADHD in her sister; Alcohol abuse in her father; Arthritis in her maternal grandmother and paternal grandmother; Cancer in her father; Diabetes in her father, mother, sister, and sister; Hearing loss in her mother and sister; Heart disease in her maternal grandmother; Hyperkalemia in her maternal grandmother; Hyperlipidemia in her maternal grandmother and mother; Hypertension in her father, maternal grandmother, and sister; Obesity in her sister; Stroke in her maternal grandmother; Varicose Veins in her maternal grandmother; Vision loss in her father and mother. Allergies[1]  Review of Systems  Constitutional:  Positive for malaise/fatigue. Negative for chills and fever.  HENT:  Positive for congestion, sinus pain and sore throat.   Respiratory:  Positive for cough. Negative for hemoptysis and shortness of breath.   Cardiovascular:  Negative for chest pain.      Objective:     BP 128/60   Pulse 90   Temp  98.3 F (36.8 C) (Oral)   Wt 168 lb 11.2 oz (76.5 kg)   SpO2 99%   BMI 25.65 kg/m  BP Readings from Last 3 Encounters:  04/10/24 128/60  01/23/24 110/82  01/13/24 139/79   Wt Readings from Last 3 Encounters:  04/10/24 168 lb 11.2 oz (76.5 kg)  01/22/24 148 lb (67.1 kg)  01/13/24 148 lb (67.1 kg)      Physical Exam Vitals reviewed.  Constitutional:      General: She is not in acute distress.    Appearance: She is not ill-appearing.  HENT:     Ears:     Comments: Moderate cerumen both canals.    Mouth/Throat:     Mouth: Mucous membranes are moist.     Pharynx: Oropharynx is clear. No oropharyngeal exudate or posterior oropharyngeal erythema.  Cardiovascular:     Rate and Rhythm: Normal rate and regular rhythm.   Pulmonary:     Effort: Pulmonary effort is normal.     Breath sounds: Normal breath sounds. No wheezing or rales.  Musculoskeletal:     Cervical back: Neck supple.  Neurological:     Mental Status: She is alert.      No results found for any visits on 04/10/24.    The 10-year ASCVD risk score (Arnett DK, et al., 2019) is: 4.3%    Assessment & Plan:   1 week history of progressive sinusitis and upper respiratory symptoms.  Patient requesting COVID and influenza testing.  COVID-negative.  Influenza A positive.  She is several days into illness and Tamiflu would not be of any benefit at this point.  We suggested Tessalon  Perles 100 mg every 8 hours as needed for cough.  She is having frequent postnasal drip and thinks some of this is allergy related.  We wrote for Astelin  nasal 1 to 2 sprays per nostril twice daily as needed.  Plenty fluids and rest.  Follow-up for any recurrent fever or other concerns  Wolm Scarlet, MD     [1]  Allergies Allergen Reactions   Peanut Oil Itching   Penicillins Anaphylaxis and Other (See Comments)    Syncope   Enulose [Lactulose] Diarrhea, Nausea Only and Other (See Comments)    GI intolerance    Food Itching and Swelling    Tree nuts-itching, swelling    Lactose Intolerance (Gi) Diarrhea and Other (See Comments)    GI intolerance    Lyrica [Pregabalin] Other (See Comments)    Dizziness Confusion    Zestril [Lisinopril] Cough   Banana Itching, Swelling and Rash   Mustard Oil [Allyl Isothiocyanate] Hives and Rash   "

## 2024-04-14 ENCOUNTER — Ambulatory Visit: Payer: Self-pay | Admitting: *Deleted

## 2024-04-14 NOTE — Telephone Encounter (Signed)
 Copied from CRM #[id number]. Topic: Clinical - Medical Advice >> Apr 14, 2024  4:16 PM Fonda T wrote: Reason for CRM: Pt calling back, requesting to speak directly to office, reports she does not want to speak to anyone else.  Call is regarding previous message that she thinks she has an infection, with colored green mucous,  and pt is upset as she has not received a return call to advise further.  Called and spoke to office, Madeline, advised pt will need to schedule an appt for further evaluation.  Pt informed of above, states she was just seen in office last week, on Friday.   Pt did not schedule an appt after asking if I could schedule appt for her.   Send CRM high priority as advised by front desk to office.

## 2024-04-14 NOTE — Telephone Encounter (Signed)
 S/p influenza A on 04/10/24. Requesting medication from Dr. Micheal. Please advise if another appt needed.    FYI Only or Action Required?: Action required by provider: update on patient condition and requesting antibiotics and diflucan .  Patient was last seen in primary care on 04/10/2024 by Micheal Wolm ORN, MD.  Called Nurse Triage reporting No chief complaint on file..  Symptoms began yesterday.  Interventions attempted: Rest, hydration, or home remedies.  Symptoms are: gradually worsening.  Triage Disposition: See Physician Within 24 Hours  Patient/caregiver understands and will follow disposition?: No, wishes to speak with PCP                 Reason for Disposition  [1] Continuous (nonstop) coughing interferes with work or school AND [2] no improvement using cough treatment per Care Advice    Coughing up green mucus  Answer Assessment - Initial Assessment Questions Last OV 04/10/24 with Dr. Micheal. Patient reports she is now having productive cough with green colored mucus. Requesting antibiotic and diflucan  please. Please advise if another OV needed.      1. ONSET: When did the cough begin?      Worsening sx yesterday        3. SPUTUM: Describe the color of your sputum (e.g., none, dry cough; clear, white, yellow, green)     Green mucus  5. DIFFICULTY BREATHING: Are you having difficulty breathing? If Yes, ask: How bad is it? (e.g., mild, moderate, severe)      Denies  6. FEVER: Do you have a fever? If Yes, ask: What is your temperature, how was it measured, and when did it start?  no 10. OTHER SYMPTOMS: Do you have any other symptoms? (e.g., runny nose, wheezing, chest pain)       Coughing , green mucus . No chest pain no difficulty  breathing no fever  Protocols used: Cough - Acute Productive-A-AH

## 2024-04-15 NOTE — Telephone Encounter (Signed)
 See other note

## 2024-04-15 NOTE — Telephone Encounter (Signed)
 Called patient left a VM to call back to sch office or Video visit to be seen
